# Patient Record
Sex: Female | Born: 1980
Health system: Southern US, Community
[De-identification: ages and names within clinical notes are randomized; demographics above are authoritative.]

## PROBLEM LIST (undated history)

## (undated) DIAGNOSIS — F329 Major depressive disorder, single episode, unspecified: Secondary | ICD-10-CM

## (undated) DIAGNOSIS — K219 Gastro-esophageal reflux disease without esophagitis: Secondary | ICD-10-CM

## (undated) DIAGNOSIS — N979 Female infertility, unspecified: Secondary | ICD-10-CM

## (undated) DIAGNOSIS — I10 Essential (primary) hypertension: Secondary | ICD-10-CM

## (undated) DIAGNOSIS — M549 Dorsalgia, unspecified: Secondary | ICD-10-CM

## (undated) DIAGNOSIS — K589 Irritable bowel syndrome without diarrhea: Secondary | ICD-10-CM

## (undated) DIAGNOSIS — J45909 Unspecified asthma, uncomplicated: Secondary | ICD-10-CM

## (undated) DIAGNOSIS — F419 Anxiety disorder, unspecified: Secondary | ICD-10-CM

## (undated) DIAGNOSIS — G43909 Migraine, unspecified, not intractable, without status migrainosus: Secondary | ICD-10-CM

## (undated) DIAGNOSIS — G500 Trigeminal neuralgia: Secondary | ICD-10-CM

## (undated) DIAGNOSIS — T7840XA Allergy, unspecified, initial encounter: Secondary | ICD-10-CM

## (undated) DIAGNOSIS — R5383 Other fatigue: Secondary | ICD-10-CM

## (undated) DIAGNOSIS — F32A Depression, unspecified: Secondary | ICD-10-CM

## (undated) DIAGNOSIS — G4730 Sleep apnea, unspecified: Secondary | ICD-10-CM

## (undated) DIAGNOSIS — E785 Hyperlipidemia, unspecified: Secondary | ICD-10-CM

## (undated) DIAGNOSIS — M5126 Other intervertebral disc displacement, lumbar region: Secondary | ICD-10-CM

## (undated) DIAGNOSIS — R0602 Shortness of breath: Secondary | ICD-10-CM

## (undated) HISTORY — DX: Allergy, unspecified, initial encounter: T78.40XA

## (undated) HISTORY — DX: Anxiety disorder, unspecified: F41.9

## (undated) HISTORY — DX: Dorsalgia, unspecified: M54.9

## (undated) HISTORY — DX: Female infertility, unspecified: N97.9

## (undated) HISTORY — DX: Irritable bowel syndrome, unspecified: K58.9

## (undated) HISTORY — DX: Other fatigue: R53.83

## (undated) HISTORY — DX: Sleep apnea, unspecified: G47.30

## (undated) HISTORY — DX: Migraine, unspecified, not intractable, without status migrainosus: G43.909

## (undated) HISTORY — DX: Unspecified asthma, uncomplicated: J45.909

## (undated) HISTORY — DX: Trigeminal neuralgia: G50.0

## (undated) HISTORY — DX: Depression, unspecified: F32.A

## (undated) HISTORY — DX: Other intervertebral disc displacement, lumbar region: M51.26

## (undated) HISTORY — PX: WISDOM TOOTH EXTRACTION: SHX21

## (undated) HISTORY — DX: Hyperlipidemia, unspecified: E78.5

## (undated) HISTORY — DX: Shortness of breath: R06.02

## (undated) HISTORY — DX: Essential (primary) hypertension: I10

## (undated) HISTORY — DX: Major depressive disorder, single episode, unspecified: F32.9

## (undated) HISTORY — DX: Gastro-esophageal reflux disease without esophagitis: K21.9

---
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
## 1998-05-24 ENCOUNTER — Emergency Department (HOSPITAL_COMMUNITY): Admission: EM | Admit: 1998-05-24 | Discharge: 1998-05-24 | Payer: Self-pay

## 2000-07-28 ENCOUNTER — Emergency Department (HOSPITAL_COMMUNITY): Admission: EM | Admit: 2000-07-28 | Discharge: 2000-07-28 | Payer: Self-pay | Admitting: Emergency Medicine

## 2000-08-03 ENCOUNTER — Ambulatory Visit (HOSPITAL_COMMUNITY): Admission: RE | Admit: 2000-08-03 | Discharge: 2000-08-03 | Payer: Self-pay | Admitting: *Deleted

## 2001-07-22 ENCOUNTER — Other Ambulatory Visit: Admission: RE | Admit: 2001-07-22 | Discharge: 2001-07-22 | Payer: Self-pay | Admitting: Obstetrics and Gynecology

## 2002-04-13 ENCOUNTER — Emergency Department (HOSPITAL_COMMUNITY): Admission: EM | Admit: 2002-04-13 | Discharge: 2002-04-14 | Payer: Self-pay | Admitting: Emergency Medicine

## 2002-04-14 ENCOUNTER — Encounter: Payer: Self-pay | Admitting: Obstetrics and Gynecology

## 2002-04-14 ENCOUNTER — Encounter: Admission: RE | Admit: 2002-04-14 | Discharge: 2002-04-14 | Payer: Self-pay | Admitting: Obstetrics and Gynecology

## 2002-07-25 ENCOUNTER — Other Ambulatory Visit: Admission: RE | Admit: 2002-07-25 | Discharge: 2002-07-25 | Payer: Self-pay | Admitting: Obstetrics and Gynecology

## 2002-08-10 ENCOUNTER — Encounter: Payer: Self-pay | Admitting: Emergency Medicine

## 2002-08-10 ENCOUNTER — Emergency Department (HOSPITAL_COMMUNITY): Admission: EM | Admit: 2002-08-10 | Discharge: 2002-08-10 | Payer: Self-pay | Admitting: Emergency Medicine

## 2003-03-31 ENCOUNTER — Emergency Department (HOSPITAL_COMMUNITY): Admission: EM | Admit: 2003-03-31 | Discharge: 2003-03-31 | Payer: Self-pay | Admitting: Emergency Medicine

## 2003-08-23 ENCOUNTER — Other Ambulatory Visit: Admission: RE | Admit: 2003-08-23 | Discharge: 2003-08-23 | Payer: Self-pay | Admitting: Obstetrics and Gynecology

## 2004-10-09 ENCOUNTER — Other Ambulatory Visit: Admission: RE | Admit: 2004-10-09 | Discharge: 2004-10-09 | Payer: Self-pay | Admitting: Obstetrics and Gynecology

## 2005-03-19 ENCOUNTER — Emergency Department: Payer: Self-pay | Admitting: Emergency Medicine

## 2006-02-10 ENCOUNTER — Emergency Department (HOSPITAL_COMMUNITY): Admission: EM | Admit: 2006-02-10 | Discharge: 2006-02-10 | Payer: Self-pay | Admitting: Emergency Medicine

## 2006-10-13 ENCOUNTER — Other Ambulatory Visit: Admission: RE | Admit: 2006-10-13 | Discharge: 2006-10-13 | Payer: Self-pay | Admitting: Obstetrics and Gynecology

## 2006-10-13 ENCOUNTER — Encounter: Admission: RE | Admit: 2006-10-13 | Discharge: 2006-10-13 | Payer: Self-pay | Admitting: Obstetrics and Gynecology

## 2007-08-28 ENCOUNTER — Emergency Department (HOSPITAL_COMMUNITY): Admission: EM | Admit: 2007-08-28 | Discharge: 2007-08-28 | Payer: Self-pay | Admitting: Emergency Medicine

## 2007-08-29 ENCOUNTER — Ambulatory Visit (HOSPITAL_COMMUNITY): Admission: RE | Admit: 2007-08-29 | Discharge: 2007-08-29 | Payer: Self-pay | Admitting: Obstetrics and Gynecology

## 2007-10-17 ENCOUNTER — Other Ambulatory Visit: Admission: RE | Admit: 2007-10-17 | Discharge: 2007-10-17 | Payer: Self-pay | Admitting: Obstetrics and Gynecology

## 2007-10-24 ENCOUNTER — Other Ambulatory Visit: Admission: RE | Admit: 2007-10-24 | Discharge: 2007-10-24 | Payer: Self-pay | Admitting: Obstetrics and Gynecology

## 2008-09-10 ENCOUNTER — Emergency Department: Payer: Self-pay | Admitting: Emergency Medicine

## 2010-06-13 NOTE — Procedures (Signed)
[place]  Patient:    Monica Benjamin, Monica Benjamin                    MRN: [id number] Adm. Date:  [id number] Attending:  Ammie Dalton CC:         Ammie Dalton, M.D.   Procedure Report  CLINICAL NOTE:  Monica Benjamin is a 30 year old female who was referred by Monica Benjamin for an epidural blood patch.  She has had a one-week history of a headache, which has been eased off by recumbency after undergoing a spinal tap for syndrome including flu-like illness one week ago.  She apparently had two spinal taps due to her inability to lie still with the first one, and the headache that she had with the viral syndrome changed somewhat and has become a bifrontal headache, which comes on at about 11 oclock in the morning and is relieved by lying flat.  No other neurologic symptoms associated, and her CSF was negative from the lumbar puncture.  The patient has not taken any medications other than Vioxx, which she took in the middle of the day today, and she does not bruise easily.  Review of systems was negative, she does not have any allergies, and her vital signs were stable.  On examination, slight young female in acute distress.  IMPRESSION:  She has a post-lumbar puncture headache.  PLAN:  Epidural blood patch.  I explained the risks and benefits to the patient and her mother, which include an additional spinal tap with continued headache, no relief from the headache, repeat of the patch if the headache recurs, and back pain which is usually short-lived and lasts about 24 hours from the needle stick.  They agreed to the procedure, and we transported her t to the preoperative holding area in the operating room at [facility] to perform the procedure.  PROCEDURE:  Epidural blood patch.  DESCRIPTION OF PROCEDURE:  The patient was placed in the left lateral decubitus position.  Her back and her left antecubital fossa were prepped sterilely with  Betadine.  Xylocaine 1% was infiltrated at the L4-5 interspace in the midline.  A #17 Tuohy needle was advanced to the epidural space using a loss of resistance technique at the L4-5 interspace in the midline without problem, and I then drew 15 cc of blood sterilely from her left antecubital fossa and injected it incrementally in through the Tuohy needle into her back. Toward the end of the injection, she did complain of some low back discomfort with no hamstring discomfort or neck discomfort.  The needle was removed, and the patient was turned supine.  The patient complained of low back pain below the site of the injection immediately after the procedure.  There was no hamstring tenderness and no weakness or numbness of the lower extremities. She could walk, and I discussed with her and her mother the probability of the pain being either due to the blood patch procedure or the stretching of the epidural space with injection of blood.  I prescribed for her Toradol 10 mg p.o., the first one I gave right away.  DISPOSITION:  She was discharged about one hour after the procedure home with her mother.  She was going to be with her roommate in her apartment overnight, and patient walked to the car unassisted.  I gave her a telephone number to call if she had any problems and a prescription of Toradol 10 mg p.o. t.i.d. for five doses.  She will follow up with me p.r.n., otherwise Monica Benjamin. She will call me if she has any problems. DD:  08/03/00 TD:  08/04/00 Job: [id number]

## 2010-10-24 LAB — URINALYSIS, ROUTINE W REFLEX MICROSCOPIC
Bilirubin Urine: NEGATIVE
Hgb urine dipstick: NEGATIVE
Ketones, ur: NEGATIVE
Nitrite: NEGATIVE
Urobilinogen, UA: 0.2

## 2010-10-24 LAB — RPR: RPR Ser Ql: NONREACTIVE

## 2010-10-24 LAB — GC/CHLAMYDIA PROBE AMP, GENITAL
Chlamydia, DNA Probe: NEGATIVE
GC Probe Amp, Genital: NEGATIVE

## 2010-10-24 LAB — PREGNANCY, URINE: Preg Test, Ur: NEGATIVE

## 2011-03-22 ENCOUNTER — Ambulatory Visit (INDEPENDENT_AMBULATORY_CARE_PROVIDER_SITE_OTHER): Payer: BC Managed Care – PPO | Admitting: Physician Assistant

## 2011-03-22 VITALS — BP 135/84 | HR 92 | Temp 98.9°F | Resp 16 | Ht 64.0 in | Wt 188.2 lb

## 2011-03-22 DIAGNOSIS — R05 Cough: Secondary | ICD-10-CM

## 2011-03-22 DIAGNOSIS — K219 Gastro-esophageal reflux disease without esophagitis: Secondary | ICD-10-CM

## 2011-03-22 DIAGNOSIS — F329 Major depressive disorder, single episode, unspecified: Secondary | ICD-10-CM

## 2011-03-22 DIAGNOSIS — R062 Wheezing: Secondary | ICD-10-CM

## 2011-03-22 MED ORDER — ALBUTEROL SULFATE (2.5 MG/3ML) 0.083% IN NEBU
2.5000 mg | INHALATION_SOLUTION | Freq: Once | RESPIRATORY_TRACT | Status: AC
  Administered 2011-03-22: 2.5 mg via RESPIRATORY_TRACT

## 2011-03-22 MED ORDER — ALBUTEROL SULFATE HFA 108 (90 BASE) MCG/ACT IN AERS: 2.0000 | INHALATION_SPRAY | RESPIRATORY_TRACT | Status: DC | PRN

## 2011-03-22 MED ORDER — IPRATROPIUM BROMIDE 0.02 % IN SOLN
0.5000 mg | Freq: Once | RESPIRATORY_TRACT | Status: AC
  Administered 2011-03-22: 0.5 mg via RESPIRATORY_TRACT

## 2011-03-22 MED ORDER — RANITIDINE HCL 150 MG PO TABS: 150.0000 mg | ORAL_TABLET | Freq: Two times a day (BID) | ORAL | Status: DC

## 2011-03-22 NOTE — Patient Instructions (Signed)
Go ahead and start the OTC Prilosec.  When your symptoms are improved, you may reduce the Ranitidine (Zantac) to only at bedtime, then stop it entirely, but continue the Prilosec.  USe the inhaler as needed for cough.  If you are needing the inhaler more than 3 times per week in 2-3 weeks, return for re-evaluation and medication adjustment.

## 2011-03-22 NOTE — Progress Notes (Signed)
  Subjective:    Patient ID: Monica Benjamin, female    DOB: [birth year], 31 y.o.   MRN: [id number]  HPI This patient presents with one month of cough, wheezing and indigestion, esophageal burning. She notes a weight increase over the last year since starting Cymbalta and believes that the weight gain has caused reflux which is in turn causing her respiratory symptoms. She does not feel sick-no nasal congestion, postnasal drainage, ear fullness or popping, fever or chills. No myalgias.  Her symptoms are worse at night. Cough is nonproductive. Symptoms are worse with exercise, large meals, foods that are high in fat, acid and spice.   Review of Systems As above.    Objective:   Physical Exam  Vital signs are noted. This is a well-developed, well-nourished white female who is awake, alert and oriented in no acute distress. Normocephalic and atraumatic. Pupils are equal round and reactive to light and extraocular movements are intact. Sclera and conjunctiva are clear. The external auditory canals and TMs are clear bilaterally. Nasal mucosa is moist and not congested. Oropharynx is clear. Neck is supple and nontender without lymphadenopathy or thyromegaly. Heart has a regular rate and rhythm. Lungs with diffuse high pitched musical wheezes and coughs throughout the exam. Abdomen with normoactive bowel sounds, supple with mild generalized tenderness, a little worse in the epigastrium. No masses or organomegaly. Skin is warm and dry. Peripheral pulses are symmetrically strong.  After neb treatment of albuterol and Atrovent the patient reports reduced symptoms. Wheezing is resolved.    Assessment & Plan:  Cough, wheezing, gastroesophageal reflux.  Ranitidine 150 mg twice a day. She has already purchased OTC Prilosec and will initiate that as well. Albuterol inhaler.  As her symptoms improve she may reduce the ranitidine to QD. And discontinue completely. If she is still needing the albuterol  inhaler more than 3 times per week and 2-3 weeks she should return for reevaluation and to modification in medication.

## 2011-05-25 ENCOUNTER — Ambulatory Visit (INDEPENDENT_AMBULATORY_CARE_PROVIDER_SITE_OTHER): Payer: BC Managed Care – PPO | Admitting: Physician Assistant

## 2011-05-25 VITALS — BP 139/92 | HR 70 | Temp 98.3°F | Resp 16 | Ht 64.0 in | Wt 190.0 lb

## 2011-05-25 DIAGNOSIS — R05 Cough: Secondary | ICD-10-CM

## 2011-05-25 DIAGNOSIS — R062 Wheezing: Secondary | ICD-10-CM

## 2011-05-25 DIAGNOSIS — K219 Gastro-esophageal reflux disease without esophagitis: Secondary | ICD-10-CM

## 2011-05-25 MED ORDER — HYDROCOD POLST-CHLORPHEN POLST 10-8 MG/5ML PO LQCR: 5.0000 mL | Freq: Two times a day (BID) | ORAL | Status: DC | PRN

## 2011-05-25 MED ORDER — BECLOMETHASONE DIPROPIONATE 80 MCG/ACT IN AERS: 2.0000 | INHALATION_SPRAY | Freq: Two times a day (BID) | RESPIRATORY_TRACT | Status: DC

## 2011-05-25 NOTE — Patient Instructions (Signed)
Continue the treatment for reflux with the ranitidine (Zantac).  Use the rescue inhaler and the cough syrup as needed.

## 2011-05-25 NOTE — Progress Notes (Signed)
  Subjective:    Patient ID: Monica Benjamin, female    DOB: August 11, 1980, 31 y.o.   MRN: [id number]  HPI  GI symptoms of burning and indigestion are resolved with Ranitidine.  She took OTC Prilosec x 14 days.  Cough and wheezing persist.  Wakes her from sleep. Albuterol relieves symptoms for approximately 30 minutes. No fever, chills.  No GU symptoms.  No N/V/D.  Review of Systems As above.    Objective:   Physical Exam  Vital signs noted. Well-developed, well nourished WF who is awake, alert and oriented, in NAD. HEENT: [place]/AT,sclera and conjunctiva are clear.   Neck: supple, non-tender, no lymphadenopathy, thyromegaly. Heart: RRR, no murmur Lungs: high-pitched musical wheezes on exhalation. Extremities: no cyanosis, clubbing or edema. Skin: warm and dry without rash.  Peak flow 360. Spirometry is normal.     Assessment & Plan:   1. Cough  beclomethasone (QVAR) 80 MCG/ACT inhaler, chlorpheniramine-HYDROcodone (TUSSIONEX PENNKINETIC ER) 10-8 MG/5ML LQCR  2. Wheezing  beclomethasone (QVAR) 80 MCG/ACT inhaler  3. Esophageal reflux     Patient Instructions  Continue the treatment for reflux with the ranitidine (Zantac).  Use the rescue inhaler and the cough syrup as needed.    Re-evaluate in 2 weeks.

## 2011-06-11 ENCOUNTER — Ambulatory Visit: Payer: BC Managed Care – PPO | Admitting: Physician Assistant

## 2011-06-16 ENCOUNTER — Encounter: Payer: Self-pay | Admitting: Physician Assistant

## 2011-06-16 ENCOUNTER — Ambulatory Visit (INDEPENDENT_AMBULATORY_CARE_PROVIDER_SITE_OTHER): Payer: BC Managed Care – PPO | Admitting: Physician Assistant

## 2011-06-16 VITALS — BP 133/92 | HR 62 | Temp 98.3°F | Resp 16 | Ht 64.0 in | Wt 188.8 lb

## 2011-06-16 DIAGNOSIS — R05 Cough: Secondary | ICD-10-CM

## 2011-06-16 DIAGNOSIS — K2100 Gastro-esophageal reflux disease with esophagitis, without bleeding: Secondary | ICD-10-CM

## 2011-06-16 DIAGNOSIS — J309 Allergic rhinitis, unspecified: Secondary | ICD-10-CM

## 2011-06-16 DIAGNOSIS — R059 Cough, unspecified: Secondary | ICD-10-CM

## 2011-06-16 DIAGNOSIS — R062 Wheezing: Secondary | ICD-10-CM

## 2011-06-16 MED ORDER — IPRATROPIUM BROMIDE 0.02 % IN SOLN: 0.5000 mg | Freq: Once | RESPIRATORY_TRACT | Status: DC

## 2011-06-16 MED ORDER — ALBUTEROL SULFATE (2.5 MG/3ML) 0.083% IN NEBU: 2.5000 mg | INHALATION_SOLUTION | Freq: Once | RESPIRATORY_TRACT | Status: DC

## 2011-06-16 MED ORDER — FLUTICASONE PROPIONATE 50 MCG/ACT NA SUSP: 2.0000 | Freq: Every day | NASAL | Status: DC

## 2011-06-16 MED ORDER — PREDNISONE 20 MG PO TABS: ORAL_TABLET | ORAL | Status: AC

## 2011-06-16 MED ORDER — RANITIDINE HCL 150 MG PO TABS: 150.0000 mg | ORAL_TABLET | Freq: Two times a day (BID) | ORAL | Status: DC

## 2011-06-16 NOTE — Patient Instructions (Signed)
Continue Zantac daily for relief of acid reflux Add OTC Zyrtec daily as well as prednisone taper

## 2011-06-16 NOTE — Progress Notes (Signed)
  Subjective:    Patient ID: Monica Benjamin, female    DOB: September 17, 1980, 31 y.o.   MRN: [id number]  HPI Patient present for recheck of wheezing, cough, and reflux esophagitis. States the reflux has significantly improved with the ranitidine and she no longer is experiencing those symptoms. Admits that the addition of the Qvar inhaler helped tremendously with her wheezing for about 1 week, then she developed a sore throat which exacerbated her wheezing again. She says she believes the sore throat is a result of her allergies, but she has not been on any medication to treat this. Her sore throat has improved but she continues to wheeze and have cough.    Review of Systems  All other systems reviewed and are negative.       Objective:   Physical Exam  Constitutional: She is oriented to person, place, and time. She appears well-developed and well-nourished.  HENT:  Head: Normocephalic and atraumatic.  Right Ear: External ear normal.  Left Ear: External ear normal.  Mouth/Throat: No oropharyngeal exudate.  Eyes: Conjunctivae are normal.  Neck: Neck supple.  Cardiovascular: Normal rate, regular rhythm and normal heart sounds.   Pulmonary/Chest: Effort normal. She has wheezes (diffuse inspiratory ).  Lymphadenopathy:    She has no cervical adenopathy.  Neurological: She is alert and oriented to person, place, and time.  Psychiatric: She has a normal mood and affect. Her behavior is normal. Judgment and thought content normal.          Assessment & Plan:   1. Wheezing  Continue Qvar inhaler daily and albuterol inhaler as needed Will add prednisone taper to help with bronchospasm as well as allergies albuterol (PROVENTIL) (2.5 MG/3ML) 0.083% nebulizer solution 2.5 mg, ipratropium (ATROVENT) nebulizer solution 0.5 mg  2. Allergic rhinitis  Add daily OTC Zyrtec predniSONE (DELTASONE) 20 MG tablet  3. Cough  Tussionex as needed.    4. Reflux esophagitis  Continue at current dose  until breathing resolves, then recommend back down to 1 tablet daily ranitidine (ZANTAC) 150 MG tablet

## 2011-06-16 NOTE — Progress Notes (Signed)
Patient seen and Precepted with Ms. Marte, PA-C and agree.  

## 2011-07-09 ENCOUNTER — Ambulatory Visit: Payer: BC Managed Care – PPO | Admitting: Physician Assistant

## 2012-03-09 LAB — HM PAP SMEAR: HM PAP: NEGATIVE

## 2012-06-02 ENCOUNTER — Other Ambulatory Visit: Payer: Self-pay | Admitting: Physician Assistant

## 2013-02-06 ENCOUNTER — Telehealth: Payer: Self-pay | Admitting: Nurse Practitioner

## 2013-02-06 MED ORDER — NORETHINDRONE 0.35 MG PO TABS: 1.0000 | ORAL_TABLET | Freq: Every day | ORAL | Status: DC

## 2013-02-06 NOTE — Telephone Encounter (Signed)
Last filled: AEX 03/11/12 #90/3 Refills Current AEX scheduled for 03/13/13   Camilla # 3 packs/3 reiflls (patient gets 90 day supply usually) sent; patient aware.

## 2013-02-06 NOTE — Telephone Encounter (Signed)
Pt is requesting a refill for Camila called into [facility]  [phone]

## 2013-03-13 ENCOUNTER — Ambulatory Visit: Payer: BC Managed Care – PPO | Admitting: Nurse Practitioner

## 2013-04-17 ENCOUNTER — Encounter: Payer: Self-pay | Admitting: Nurse Practitioner

## 2013-04-17 ENCOUNTER — Ambulatory Visit (INDEPENDENT_AMBULATORY_CARE_PROVIDER_SITE_OTHER): Payer: BC Managed Care – PPO | Admitting: Nurse Practitioner

## 2013-04-17 VITALS — BP 120/74 | HR 60 | Ht 64.0 in | Wt 221.0 lb

## 2013-04-17 DIAGNOSIS — Z01419 Encounter for gynecological examination (general) (routine) without abnormal findings: Secondary | ICD-10-CM

## 2013-04-17 MED ORDER — NORETHINDRONE 0.35 MG PO TABS: 1.0000 | ORAL_TABLET | Freq: Every day | ORAL | Status: DC

## 2013-04-17 NOTE — Patient Instructions (Signed)

## 2013-04-17 NOTE — Progress Notes (Signed)
Patient ID: Monica Benjamin, female   DOB: Nov 16, 1980, 33 y.o.   MRN: [id number] 33 y.o. G0P0 Single Caucasian Fe here for annual exam. Menses for 3-4 days on POP. Still some PMS at 1 week prior. Getting married in September. She is now off Cymbalta and changed to Lexapro and has lost about 15 pounds.  Patient's last menstrual period was 04/06/2013.          Sexually active: yes, same sex partner The current method of family planning is none and OCP (estrogen/progesterone).    Exercising: yes  Gym/ health club routine includes cardio and light weights 4 times per week. Smoker:  no  Health Maintenance: Pap:  03/09/12, WNL, neg HR HPV TDaP:  09/2006 Gardasil:  completed in 2007 Labs:  PCP   reports that she has never smoked. She has never used smokeless tobacco. She reports that she drinks alcohol. She reports that she does not use illicit drugs.  Past Medical History  Diagnosis Date  . Anxiety   . Depression   . Trigeminal neuralgia   . Migraines     Past Surgical History  Procedure Laterality Date  . Wisdom tooth extraction      Current Outpatient Prescriptions  Medication Sig Dispense Refill  . albuterol (PROVENTIL HFA;VENTOLIN HFA) 108 (90 BASE) MCG/ACT inhaler Inhale 2 puffs into the lungs every 4 (four) hours as needed for wheezing (cough, shortness of breath or wheezing.).  1 Inhaler  1  . escitalopram (LEXAPRO) 10 MG tablet Take 10 mg by mouth daily.      . norethindrone (CAMILA) 0.35 MG tablet Take 1 tablet (0.35 mg total) by mouth daily.  3 Package  3  . pantoprazole (PROTONIX) 40 MG tablet Take 40 mg by mouth 2 (two) times daily.      Marland Kitchen QVAR 80 MCG/ACT inhaler INHALE 2 PUFFS BY MOUTH 2 TIMES A DAY.  8.7 g  0   No current facility-administered medications for this visit.    Family History  Problem Relation Age of Onset  . Osteoporosis Mother   . Migraines Mother   . Migraines Sister     ROS:  Pertinent items are noted in HPI.  Otherwise, a comprehensive ROS was  negative.  Exam:   BP 120/74  Pulse 60  Ht 5\' 4"  (1.626 m)  Wt 221 lb (100.245 kg)  BMI 37.92 kg/m2  LMP 04/06/2013 Height: 5\' 4"  (162.6 cm)  Ht Readings from Last 3 Encounters:  04/17/13 5\' 4"  (1.626 m)  06/16/11 5\' 4"  (1.626 m)  05/25/11 5\' 4"  (1.626 m)    General appearance: alert, cooperative and appears stated age Head: Normocephalic, without obvious abnormality, atraumatic Neck: no adenopathy, supple, symmetrical, trachea midline and thyroid normal to inspection and palpation Lungs: clear to auscultation bilaterally Breasts: normal appearance, no masses or tenderness Heart: regular rate and rhythm Abdomen: soft, non-tender; no masses,  no organomegaly Extremities: extremities normal, atraumatic, no cyanosis or edema Skin: Skin color, texture, turgor normal. No rashes or lesions Lymph nodes: Cervical, supraclavicular, and axillary nodes normal. No abnormal inguinal nodes palpated Neurologic: Grossly normal   Pelvic: External genitalia:  no lesions              Urethra:  normal appearing urethra with no masses, tenderness or lesions              Bartholin's and Skene's: normal                 Vagina: normal appearing vagina  with normal color and discharge, no lesions              Cervix: anteverted              Pap taken: no Bimanual Exam:  Uterus:  normal size, contour, position, consistency, mobility, non-tender              Adnexa: no mass, fullness, tenderness               Rectovaginal: Confirms               Anus:  normal sphincter tone, no lesions  A:  Well Woman with normal exam  Same sex partner - getting married in September  On POP for cycle regulation  History of depression and anxiety  P:   Pap smear as per guidelines Not done  Refill Camilla for a year  Counseled on breast self exam, adequate intake of calcium and vitamin D, diet and exercise return annually or prn  An After Visit Summary was printed and given to the patient.

## 2013-04-18 NOTE — Progress Notes (Signed)
Encounter reviewed by Dr. Caliyah Sieh Silva.  

## 2013-12-27 ENCOUNTER — Other Ambulatory Visit: Payer: Self-pay | Admitting: Physician Assistant

## 2013-12-27 DIAGNOSIS — K219 Gastro-esophageal reflux disease without esophagitis: Secondary | ICD-10-CM

## 2013-12-27 DIAGNOSIS — R109 Unspecified abdominal pain: Secondary | ICD-10-CM

## 2013-12-27 DIAGNOSIS — IMO0001 Reserved for inherently not codable concepts without codable children: Secondary | ICD-10-CM

## 2014-01-02 ENCOUNTER — Ambulatory Visit
Admission: RE | Admit: 2014-01-02 | Discharge: 2014-01-02 | Disposition: A | Discharge status: Home / Self Care | Payer: Self-pay | Source: Ambulatory Visit | Attending: Physician Assistant | Admitting: Physician Assistant

## 2014-01-02 ENCOUNTER — Encounter (INDEPENDENT_AMBULATORY_CARE_PROVIDER_SITE_OTHER): Payer: Self-pay

## 2014-01-02 DIAGNOSIS — IMO0001 Reserved for inherently not codable concepts without codable children: Secondary | ICD-10-CM

## 2014-01-02 DIAGNOSIS — K219 Gastro-esophageal reflux disease without esophagitis: Secondary | ICD-10-CM

## 2014-01-02 DIAGNOSIS — R109 Unspecified abdominal pain: Secondary | ICD-10-CM

## 2014-03-30 ENCOUNTER — Other Ambulatory Visit: Payer: Self-pay | Admitting: *Deleted

## 2014-03-30 MED ORDER — NORETHINDRONE 0.35 MG PO TABS: 1.0000 | ORAL_TABLET | Freq: Every day | ORAL | Status: DC

## 2014-03-30 NOTE — Telephone Encounter (Signed)
Medication refill request: Monica Benjamin  Last AEX:  04/17/13 with PG  Next AEX: 04/23/14 with PG  Last MMG (if hormonal medication request): N/A Refill authorized: #3 packs/0 rfs, please advise.

## 2014-03-30 NOTE — Telephone Encounter (Signed)
Fax faxed to [facility] stating that #3 pks/0 rfs was sent electronically.

## 2014-04-23 ENCOUNTER — Ambulatory Visit: Payer: BC Managed Care – PPO | Admitting: Nurse Practitioner

## 2014-05-18 ENCOUNTER — Encounter: Payer: Self-pay | Admitting: Nurse Practitioner

## 2014-05-18 ENCOUNTER — Ambulatory Visit (INDEPENDENT_AMBULATORY_CARE_PROVIDER_SITE_OTHER): Payer: 59 | Admitting: Nurse Practitioner

## 2014-05-18 VITALS — BP 108/66 | HR 68 | Ht 64.0 in | Wt 222.0 lb

## 2014-05-18 DIAGNOSIS — Z0000 Encounter for general adult medical examination without abnormal findings: Secondary | ICD-10-CM

## 2014-05-18 DIAGNOSIS — R197 Diarrhea, unspecified: Secondary | ICD-10-CM | POA: Diagnosis not present

## 2014-05-18 DIAGNOSIS — Z01419 Encounter for gynecological examination (general) (routine) without abnormal findings: Secondary | ICD-10-CM

## 2014-05-18 DIAGNOSIS — R635 Abnormal weight gain: Secondary | ICD-10-CM

## 2014-05-18 LAB — POCT URINALYSIS DIPSTICK
BILIRUBIN UA: NEGATIVE
Glucose, UA: NEGATIVE
KETONES UA: NEGATIVE
Nitrite, UA: NEGATIVE
PH UA: 5
Urobilinogen, UA: NEGATIVE

## 2014-05-18 MED ORDER — NORETHINDRONE 0.35 MG PO TABS: 1.0000 | ORAL_TABLET | Freq: Every day | ORAL | Status: DC

## 2014-05-18 NOTE — Progress Notes (Signed)
Patient ID: Monica Benjamin, female   DOB: 1980/09/30, 34 y.o.   MRN: [id number] 34 y.o. G0P0 Married Caucasian Fe here for annual exam.  Menses now at 2-3 days moderate to light on POP.  She got married in September.  They are going to seek infertility counseling and try for a pregnancy this year.  Partner will be the one getting pregnant.  She is having a problem with frequent daily diarrhea 5-6 times a day for over 6 months.  No  relationship to foods.  No N/V but does have frequent lower abdominal pressure and increase in flatus.  GERD is very bad unless taking Protonix twice a day.  She has noted upper epigastric symptoms on and off.  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  Has tried to loose weight and unable to do so.  She feels bloated all the time in addition to fatigue and generally not well.      Patient's last menstrual period was 05/14/2014 (exact date).          Sexually active: Yes.    The current method of family planning is OCP (Progesterone only) and same sex partner.    Exercising: Yes.    cardio, weights and walking Smoker:  no  Health Maintenance: Pap:  03/09/12, negative with neg HR HPV TDaP:  09/2006 Labs:  HB:  13.3  Urine:  Trace RBC, trace leuk's (spotting with menses)   reports that she has never smoked. She has never used smokeless tobacco. She reports that she drinks alcohol. She reports that she does not use illicit drugs.  Past Medical History  Diagnosis Date  . Anxiety   . Depression   . Trigeminal neuralgia   . Migraines     Past Surgical History  Procedure Laterality Date  . Wisdom tooth extraction      Current Outpatient Prescriptions  Medication Sig Dispense Refill  . albuterol (PROVENTIL HFA;VENTOLIN HFA) 108 (90 BASE) MCG/ACT inhaler Inhale 2 puffs into the lungs every 4 (four) hours as needed for wheezing (cough, shortness of breath or wheezing.). 1 Inhaler 1  . escitalopram  (LEXAPRO) 10 MG tablet Take 10 mg by mouth daily.    . norethindrone (CAMILA) 0.35 MG tablet Take 1 tablet (0.35 mg total) by mouth daily. 3 Package 3  . pantoprazole (PROTONIX) 40 MG tablet Take 40 mg by mouth 2 (two) times daily.    Marland Kitchen QVAR 80 MCG/ACT inhaler INHALE 2 PUFFS BY MOUTH 2 TIMES A DAY. 8.7 g 0   No current facility-administered medications for this visit.    Family History  Problem Relation Age of Onset  . Osteoporosis Mother   . Migraines Mother   . Migraines Sister     ROS:  Pertinent items are noted in HPI.  Otherwise, a comprehensive ROS was negative.  Exam:   BP 108/66 mmHg  Pulse 68  Ht 5\' 4"  (1.626 m)  Wt 222 lb (100.699 kg)  BMI 38.09 kg/m2  LMP 05/14/2014 (Exact Date) Height: 5\' 4"  (162.6 cm) Ht Readings from Last 3 Encounters:  05/18/14 5\' 4"  (1.626 m)  04/17/13 5\' 4"  (1.626 m)  06/16/11 5\' 4"  (1.626 m)    General appearance: alert, cooperative and appears stated age Head: Normocephalic, without obvious abnormality, atraumatic Neck: no adenopathy, supple, symmetrical, trachea midline and thyroid normal to inspection and palpation Lungs: clear to auscultation bilaterally Breasts: normal appearance, no masses  or tenderness Heart: regular rate and rhythm Abdomen: soft, non-tender; no masses,  no organomegaly Extremities: extremities normal, atraumatic, no cyanosis or edema Skin: Skin color, texture, turgor normal. No rashes or lesions Lymph nodes: Cervical, supraclavicular, and axillary nodes normal. No abnormal inguinal nodes palpated Neurologic: Grossly normal   Pelvic: External genitalia:  no lesions              Urethra:  normal appearing urethra with no masses, tenderness or lesions              Bartholin's and Skene's: normal                 Vagina: normal appearing vagina with normal color and discharge, no lesions              Cervix: anteverted              Pap taken: No. Bimanual Exam:  Uterus:  normal size, contour, position,  consistency, mobility, non-tender              Adnexa: no mass, fullness, tenderness               Rectovaginal: Confirms               Anus:  normal sphincter tone, no lesions  Chaperone present:  no  A:  Well Woman with normal exam  Same sex partner - got married in September On POP for cycle regulation History of depression and anxiety  Obesity   Chronic diarrhea X 6 months - needs GI consult  Fatigue and weight gain  P:   Reviewed health and wellness pertinent to exam  Pap smear not taken today  Will follow with basic labs - TSH and panel, Vit D, H.Pylori, CMP, CBC  Will get a referral to Dr. Collene Mares for further eval  Refill on POP for a year  After the GI evaluation I feel that she would do well with Endocrinology apt. If still does not feel - I do not want her to have wight this high at her age.  Counseled on breast self exam, use and side effects of POP's, adequate intake of calcium and vitamin D, diet and exercise return annually or prn  An After Visit Summary was printed and given to the patient.

## 2014-05-18 NOTE — Patient Instructions (Signed)

## 2014-05-19 LAB — CBC WITH DIFFERENTIAL/PLATELET
BASOS ABS: 0 10*3/uL (ref 0.0–0.1)
Basophils Relative: 0 % (ref 0–1)
EOS PCT: 4 % (ref 0–5)
Eosinophils Absolute: 0.4 10*3/uL (ref 0.0–0.7)
HEMATOCRIT: 40.4 % (ref 36.0–46.0)
HEMOGLOBIN: 13.7 g/dL (ref 12.0–15.0)
LYMPHS PCT: 29 % (ref 12–46)
Lymphs Abs: 3 10*3/uL (ref 0.7–4.0)
MCH: 30.9 pg (ref 26.0–34.0)
MCHC: 33.9 g/dL (ref 30.0–36.0)
MCV: 91.2 fL (ref 78.0–100.0)
MONO ABS: 0.6 10*3/uL (ref 0.1–1.0)
MPV: 10.2 fL (ref 8.6–12.4)
Monocytes Relative: 6 % (ref 3–12)
Neutro Abs: 6.4 10*3/uL (ref 1.7–7.7)
Neutrophils Relative %: 61 % (ref 43–77)
Platelets: 318 10*3/uL (ref 150–400)
RBC: 4.43 MIL/uL (ref 3.87–5.11)
RDW: 14 % (ref 11.5–15.5)
WBC: 10.5 10*3/uL (ref 4.0–10.5)

## 2014-05-19 LAB — COMPREHENSIVE METABOLIC PANEL
ALT: 15 U/L (ref 0–35)
AST: 15 U/L (ref 0–37)
Albumin: 3.9 g/dL (ref 3.5–5.2)
Alkaline Phosphatase: 80 U/L (ref 39–117)
BUN: 13 mg/dL (ref 6–23)
CALCIUM: 9.3 mg/dL (ref 8.4–10.5)
CHLORIDE: 105 meq/L (ref 96–112)
CO2: 25 meq/L (ref 19–32)
CREATININE: 0.84 mg/dL (ref 0.50–1.10)
GLUCOSE: 96 mg/dL (ref 70–99)
Potassium: 4.3 mEq/L (ref 3.5–5.3)
Sodium: 140 mEq/L (ref 135–145)
Total Bilirubin: 0.3 mg/dL (ref 0.2–1.2)
Total Protein: 6.8 g/dL (ref 6.0–8.3)

## 2014-05-19 LAB — THYROID PANEL WITH TSH
FREE THYROXINE INDEX: 2.2 (ref 1.4–3.8)
T3 UPTAKE: 27 % (ref 22–35)
T4 TOTAL: 8.2 ug/dL (ref 4.5–12.0)
TSH: 1.346 u[IU]/mL (ref 0.350–4.500)

## 2014-05-19 LAB — VITAMIN D 25 HYDROXY (VIT D DEFICIENCY, FRACTURES): VIT D 25 HYDROXY: 27 ng/mL — AB (ref 30–100)

## 2014-05-20 NOTE — Progress Notes (Signed)
Encounter reviewed by Dr. Juergen Hardenbrook Silva.  

## 2014-05-21 LAB — HEMOGLOBIN, FINGERSTICK: Hemoglobin, fingerstick: 13.3 g/dL (ref 12.0–16.0)

## 2014-05-23 ENCOUNTER — Telehealth: Payer: Self-pay | Admitting: Nurse Practitioner

## 2014-05-23 NOTE — Telephone Encounter (Signed)
Call to patient. Advised of appointment with Dr Collene Mares on May 31, 2014 @ 1130.  Patient agreeable

## 2014-07-31 ENCOUNTER — Emergency Department (INDEPENDENT_AMBULATORY_CARE_PROVIDER_SITE_OTHER)
Admission: EM | Admit: 2014-07-31 | Discharge: 2014-07-31 | Disposition: A | Discharge status: Home / Self Care | Payer: PRIVATE HEALTH INSURANCE | Source: Home / Self Care | Attending: Emergency Medicine | Admitting: Emergency Medicine

## 2014-07-31 ENCOUNTER — Emergency Department (INDEPENDENT_AMBULATORY_CARE_PROVIDER_SITE_OTHER): Payer: PRIVATE HEALTH INSURANCE

## 2014-07-31 ENCOUNTER — Encounter (HOSPITAL_COMMUNITY): Payer: Self-pay | Admitting: *Deleted

## 2014-07-31 DIAGNOSIS — S9031XA Contusion of right foot, initial encounter: Secondary | ICD-10-CM | POA: Diagnosis not present

## 2014-07-31 NOTE — ED Notes (Signed)
Pt   Reports      She  Injured  Her    r  Foot /  Ankle  Today     After  Stepping   On a  Pallet         Pt  Reports      Pain on  Weight  Bearing

## 2014-07-31 NOTE — ED Provider Notes (Signed)
CSN: [id number]     Arrival date & time 07/31/14  1658 History   First MD Initiated Contact with Patient 07/31/14 1840     Chief Complaint  Patient presents with  . Foot Injury   (Consider location/radiation/quality/duration/timing/severity/associated sxs/prior Treatment) HPI Comments: 34 year old female was at work this afternoon and stepped on a wooden pallet that broke in her foot went through the palate. She is complaining of pain to the proximal foot along the dorsal lateral aspect. She said she cannot bear weight on the foot without having too much pain.   Past Medical History  Diagnosis Date  . Anxiety   . Depression   . Trigeminal neuralgia   . Migraines    Past Surgical History  Procedure Laterality Date  . Wisdom tooth extraction     Family History  Problem Relation Age of Onset  . Osteoporosis Mother   . Migraines Mother   . Migraines Sister    History  Substance Use Topics  . Smoking status: Never Smoker   . Smokeless tobacco: Never Used  . Alcohol Use: Yes     Comment: occas   OB History    Gravida Para Term Preterm AB TAB SAB Ectopic Multiple Living   0 0             Review of Systems  Constitutional: Negative for fever, chills and activity change.  HENT: Negative.   Respiratory: Negative.   Musculoskeletal: Negative for back pain, joint swelling and neck pain.       As per HPI  Skin: Negative for color change and pallor.  Neurological: Negative.     Allergies  Nsaids  Home Medications   Prior to Admission medications   Medication Sig Start Date End Date Taking? Authorizing Provider  albuterol (PROVENTIL HFA;VENTOLIN HFA) 108 (90 BASE) MCG/ACT inhaler Inhale 2 puffs into the lungs every 4 (four) hours as needed for wheezing (cough, shortness of breath or wheezing.). 03/22/11   Chelle Jeffery, PA-C  escitalopram (LEXAPRO) 10 MG tablet Take 10 mg by mouth daily.    Historical Provider, MD  norethindrone (CAMILA) 0.35 MG tablet Take 1 tablet (0.35  mg total) by mouth daily. 05/18/14   Kem Boroughs, FNP  pantoprazole (PROTONIX) 40 MG tablet Take 40 mg by mouth 2 (two) times daily.    Historical Provider, MD  QVAR 80 MCG/ACT inhaler INHALE 2 PUFFS BY MOUTH 2 TIMES A DAY. 06/02/12   Mancel Bale, PA-C   BP 124/68 mmHg  Pulse 72  Temp(Src) 98.5 F (36.9 C) (Oral)  Resp 16  SpO2 100%  LMP 07/11/2014 Physical Exam  Constitutional: She is oriented to person, place, and time. She appears well-developed and well-nourished. No distress.  Eyes: EOM are normal.  Neck: Normal range of motion. Neck supple.  Cardiovascular: Normal rate.   Pulmonary/Chest: Effort normal. No respiratory distress.  Abdominal: Soft. There is no tenderness.  Musculoskeletal: Normal range of motion.  Right foot with no deformity. There is a small area of swelling just distal to the lateral malleolus and over the fourth and fifth metatarsal. Distal neurovascular motor sensory is intact. Dorsiflexion intact but limited due to pain. Plantar flexion is intact. No bony tenderness to the ankle.  Neurological: She is alert and oriented to person, place, and time. No cranial nerve deficit.  Skin: Skin is warm and dry.  Psychiatric: She has a normal mood and affect.  Nursing note and vitals reviewed.   ED Course  Procedures (including critical care time)  Labs Review Labs Reviewed - No data to display  Imaging Review Dg Foot Complete Right  07/31/2014   CLINICAL DATA:  Patient stepped on wooden Pallet and broken the board. Injury to the right foot. The foot is numb. Unable to bear weight. Initial encounter.  EXAM: RIGHT FOOT COMPLETE - 3+ VIEW  COMPARISON:  None.  FINDINGS: Normal anatomic alignment. No evidence for acute fracture or dislocation. Bipartite medial sesamoid. Regional soft tissues unremarkable.  IMPRESSION: No acute osseous abnormality.   Electronically Signed   By: Lovey Newcomer M.D.   On: 07/31/2014 19:04     MDM   1. Foot contusion, right, initial  encounter    Ace or coban wrap/crutches per pt request OOW for 3 d RICE F/u with your PCP as needed   Janne Napoleon, NP 07/31/14 1935

## 2014-07-31 NOTE — Discharge Instructions (Signed)

## 2014-11-14 ENCOUNTER — Telehealth: Payer: 59 | Admitting: Family

## 2014-11-14 DIAGNOSIS — R6889 Other general symptoms and signs: Secondary | ICD-10-CM | POA: Diagnosis not present

## 2014-11-14 DIAGNOSIS — J309 Allergic rhinitis, unspecified: Secondary | ICD-10-CM

## 2014-11-14 DIAGNOSIS — J069 Acute upper respiratory infection, unspecified: Secondary | ICD-10-CM

## 2014-11-14 MED ORDER — BENZONATATE 200 MG PO CAPS: 200.0000 mg | ORAL_CAPSULE | Freq: Three times a day (TID) | ORAL | Status: DC | PRN

## 2014-11-14 MED ORDER — METHYLPREDNISOLONE 4 MG PO TBPK: ORAL_TABLET | ORAL | Status: DC

## 2014-11-14 NOTE — Progress Notes (Signed)
E visit for Flu like symptoms   We are sorry that you are not feeling well.  Here is how we plan to help! Based on what you have shared with me it looks like you may have flu-like symptoms that should be watched but do not seem to indicate anti-viral treatment because it has been over two days.   Influenza or "the flu" is   an infection caused by a respiratory virus. The flu virus is highly contagious and persons who did not receive their yearly flu vaccination may "catch" the flu from close contact.  We have anti-viral medications to treat the viruses that cause this infection. They are not a "cure" and only shorten the course of the infection. These prescriptions are most effective when they are given within the first 2 days of "flu" symptoms. Antiviral medication are indicated if you have a high risk of complications from the flu. You should  also consider an antiviral medication if you are in close contact with someone who is at risk. These medications can help patients avoid complications from the flu  but have side effects that you should know. Possible side effects from Tamiflu or oseltamivir include nausea, vomiting, diarrhea, dizziness, headaches, eye redness, sleep problems or other respiratory symptoms. You should not take Tamiflu if you have an allergy to oseltamivir or any to the ingredients in Tamiflu.  Based upon your symptoms and potential risk factors I recommend that you follow the flu symptoms recommendation that I have listed below.  ANYONE WHO HAS FLU SYMPTOMS SHOULD: . Stay home. The flu is highly contagious and going out or to work exposes others! . Be sure to drink plenty of fluids. Water is fine as well as fruit juices, sodas and electrolyte beverages. You may want to stay away from caffeine or alcohol. If you are nauseated, try taking small sips of liquids. How do you know if you are getting enough fluid? Your urine should be a pale yellow or almost colorless. . Get  rest. . Taking a steamy shower or using a humidifier may help nasal congestion and ease sore throat pain. Using a saline nasal spray works much the same way. . Cough drops, hard candies and sore throat lozenges may ease your cough. . Line up a caregiver. Have someone check on you regularly.   GET HELP RIGHT AWAY IF: . You cannot keep down liquids or your medications. . You become short of breath . Your fell like you are going to pass out or loose consciousness. . Your symptoms persist after you have completed your treatment plan MAKE SURE YOU   Understand these instructions.  Will watch your condition.  Will get help right away if you are not doing well or get worse.  Your e-visit answers were reviewed by a board certified advanced clinical practitioner to complete your personal care plan.  Depending on the condition, your plan could have included both over the counter or prescription medications.  If there is a problem please reply  once you have received a response from your provider.  Your safety is important to [place].  If you have drug allergies check your prescription carefully.    You can use MyChart to ask questions about today's visit, request a non-urgent call back, or ask for a work or school excuse for 24 hours related to this e-Visit. If it has been greater than 24 hours you will need to follow up with your provider, or enter a new e-Visit to address  those concerns.  You will get an e-mail in the next two days asking about your experience.  I hope that your e-visit has been valuable and will speed your recovery. Thank you for using e-visits.   

## 2014-11-14 NOTE — Progress Notes (Signed)
We are sorry that you are not feeling well.  Here is how we plan to help!  Based on what you have shared with me it looks like you have upper respiratory tract inflammation that has resulted in a significant cough.  Inflammation and infection in the upper respiratory tract is commonly called bronchitis and has four common causes:  Allergies, Viral Infections, Acid Reflux and Bacterial Infections.  Allergies, viruses and acid reflux are treated by controlling symptoms or eliminating the cause. An example might be a cough caused by taking certain blood pressure medications. You stop the cough by changing the medication. Another example might be a cough caused by acid reflux. Controlling the reflux helps control the cough.  Based on your presentation I believe you most likely have A cough due to allergies.  I recommend that you start the an over-the counter-allergy medication such as Claritin 10 mg or Zyrtec 10 mg daily.    In addition you may use A non-prescription cough medication called Robitussin DAC. Take 2 teaspoons every 8 hours or Delsym: take 2 teaspoons every 12 hours., A non-prescription cough medication called Mucinex DM: take 2 tablets every 12 hours. and A prescription cough medication called Tessalon Perles 100mg . You may take 1-2 capsules every 8 hours as needed for your cough. I also sent in a steroid dose pack. Use as directed.     HOME CARE . Only take medications as instructed by your medical team. . Complete the entire course of an antibiotic. . Drink plenty of fluids and get plenty of rest. . Avoid close contacts especially the very young and the elderly . Cover your mouth if you cough or cough into your sleeve. . Always remember to wash your hands . A steam or ultrasonic humidifier can help congestion.    GET HELP RIGHT AWAY IF: . You develop worsening fever. . You become short of breath . You cough up blood. . Your symptoms persist after you have completed your treatment  plan MAKE SURE YOU   Understand these instructions.  Will watch your condition.  Will get help right away if you are not doing well or get worse.  Your e-visit answers were reviewed by a board certified advanced clinical practitioner to complete your personal care plan.  Depending on the condition, your plan could have included both over the counter or prescription medications. If there is a problem please reply  once you have received a response from your provider. Your safety is important to [place].  If you have drug allergies check your prescription carefully.    You can use MyChart to ask questions about today's visit, request a non-urgent call back, or ask for a work or school excuse for 24 hours related to this e-Visit. If it has been greater than 24 hours you will need to follow up with your provider, or enter a new e-Visit to address those concerns. You will get an e-mail in the next two days asking about your experience.  I hope that your e-visit has been valuable and will speed your recovery. Thank you for using e-visits.

## 2015-01-31 MED FILL — ESCITALOPRAM 10 MG TABLET: 10 | 30 days supply | Qty: 30 | Fill #6

## 2015-01-31 MED FILL — PANTOPRAZOLE SOD DR 40 MG T: 40 | 90 days supply | Qty: 180 | Fill #3

## 2015-03-04 ENCOUNTER — Other Ambulatory Visit: Payer: Self-pay | Admitting: Nurse Practitioner

## 2015-03-04 MED ORDER — NORETHINDRONE 0.35 MG PO TABS: 1.0000 | ORAL_TABLET | Freq: Every day | ORAL | Status: DC

## 2015-03-04 MED FILL — ESCITALOPRAM 10 MG TABLET: 10 | 30 days supply | Qty: 30 | Fill #7

## 2015-03-04 MED FILL — NORETHINDRONE 0.35 MG TAB: 0.35 | 84 days supply | Qty: 84 | Fill #0

## 2015-03-04 NOTE — Telephone Encounter (Signed)
Patient requesting refill of Camila to [facility] patient will run out in 4 days. Best # to reach patient: [place] -  Phone: [phone] Fax: [phone]

## 2015-03-04 NOTE — Telephone Encounter (Signed)
Medication refill request: Monica Benjamin   Last AEX:  05-18-14  Next AEX: 05-24-15 Last MMG (if hormonal medication request): N/A Refill authorized: please advise

## 2015-04-01 MED FILL — ESCITALOPRAM 10 MG TABLET: 10 | 30 days supply | Qty: 30 | Fill #8

## 2015-04-29 MED FILL — ESCITALOPRAM 10 MG TABLET: 10 | 30 days supply | Qty: 30 | Fill #9

## 2015-05-01 MED FILL — PANTOPRAZOLE SOD DR 40 MG T: 40 | 90 days supply | Qty: 180 | Fill #0

## 2015-05-24 ENCOUNTER — Ambulatory Visit (INDEPENDENT_AMBULATORY_CARE_PROVIDER_SITE_OTHER): Payer: 59 | Admitting: Nurse Practitioner

## 2015-05-24 ENCOUNTER — Encounter: Payer: Self-pay | Admitting: Nurse Practitioner

## 2015-05-24 VITALS — BP 116/74 | HR 60 | Ht 63.5 in | Wt 217.0 lb

## 2015-05-24 DIAGNOSIS — Z01419 Encounter for gynecological examination (general) (routine) without abnormal findings: Secondary | ICD-10-CM

## 2015-05-24 DIAGNOSIS — Z0000 Encounter for general adult medical examination without abnormal findings: Secondary | ICD-10-CM | POA: Diagnosis not present

## 2015-05-24 LAB — POCT URINALYSIS DIPSTICK
BILIRUBIN UA: NEGATIVE
Glucose, UA: NEGATIVE
KETONES UA: NEGATIVE
Leukocytes, UA: NEGATIVE
Nitrite, UA: NEGATIVE
PH UA: 5
PROTEIN UA: NEGATIVE
RBC UA: NEGATIVE
Urobilinogen, UA: NEGATIVE

## 2015-05-24 MED ORDER — NORETHINDRONE 0.35 MG PO TABS: 1.0000 | ORAL_TABLET | Freq: Every day | ORAL | Status: DC

## 2015-05-24 MED FILL — NORETHINDRONE 0.35 MG TAB: 0.35 | 84 days supply | Qty: 84 | Fill #0

## 2015-05-24 NOTE — Patient Instructions (Signed)

## 2015-05-24 NOTE — Progress Notes (Signed)
Patient ID: Monica Benjamin, female   DOB: 1980/06/02, 35 y.o.   MRN: [id number]  35 y.o. G0P0 Maried Caucasian Fe here for annual exam.  Partner has now tried for pregnancy X 4 with IUI.  They are hopeful to have a pregnancy this year.  Menses is at 4-5 days on Micronor.  Sometimes 1 day heavier and has cramps.  Last year was having a lot of GI problems.  She saw Dr. Collene Mares all work up and labs were negative including H -Pylori.  Suggested to have a colonoscopy if symptoms got worse - they did not.  Working diagnosis is IBSD.  Patient's last menstrual period was 04/27/2015 (exact date).          Sexually active: Yes.    The current method of family planning is OCP (estrogen/progesterone) and same sex partner.    Exercising: Yes.    walking, weights Smoker:  no  Health Maintenance: Pap:  03/09/12, Negative with neg HR HPV TDaP:  09/2006 HIV: done today Labs:  HB: 13.3  Urine: Negative    reports that she has never smoked. She has never used smokeless tobacco. She reports that she drinks alcohol. She reports that she does not use illicit drugs.  Past Medical History  Diagnosis Date  . Anxiety   . Depression   . Trigeminal neuralgia   . Migraines     Past Surgical History  Procedure Laterality Date  . Wisdom tooth extraction      Current Outpatient Prescriptions  Medication Sig Dispense Refill  . albuterol (PROVENTIL HFA;VENTOLIN HFA) 108 (90 BASE) MCG/ACT inhaler Inhale 2 puffs into the lungs every 4 (four) hours as needed for wheezing (cough, shortness of breath or wheezing.). 1 Inhaler 1  . escitalopram (LEXAPRO) 10 MG tablet Take 10 mg by mouth daily.    Marland Kitchen loratadine (CLARITIN) 10 MG tablet Take 1 tablet by mouth daily.    . norethindrone (CAMILA) 0.35 MG tablet Take 1 tablet (0.35 mg total) by mouth daily. 3 Package 4  . pantoprazole (PROTONIX) 40 MG tablet Take 40 mg by mouth 2 (two) times daily.     No current facility-administered medications for this visit.    Family  History  Problem Relation Age of Onset  . Osteoporosis Mother   . Migraines Mother   . Migraines Sister     ROS:  Pertinent items are noted in HPI.  Otherwise, a comprehensive ROS was negative.  Exam:   BP 116/74 mmHg  Pulse 60  Ht 5' 3.5" (1.613 m)  Wt 217 lb (98.431 kg)  BMI 37.83 kg/m2  LMP 04/27/2015 (Exact Date) Height: 5' 3.5" (161.3 cm) Ht Readings from Last 3 Encounters:  05/24/15 5' 3.5" (1.613 m)  05/18/14 5\' 4"  (1.626 m)  04/17/13 5\' 4"  (1.626 m)    General appearance: alert, cooperative and appears stated age Head: Normocephalic, without obvious abnormality, atraumatic Neck: no adenopathy, supple, symmetrical, trachea midline and thyroid normal to inspection and palpation Lungs: clear to auscultation bilaterally Breasts: normal appearance, no masses or tenderness Heart: regular rate and rhythm Abdomen: soft, non-tender; no masses,  no organomegaly Extremities: extremities normal, atraumatic, no cyanosis or edema Skin: Skin color, texture, turgor normal. No rashes or lesions Lymph nodes: Cervical, supraclavicular, and axillary nodes normal. No abnormal inguinal nodes palpated Neurologic: Grossly normal   Pelvic: External genitalia:  no lesions              Urethra:  normal appearing urethra with no masses, tenderness or  lesions              Bartholin's and Skene's: normal                 Vagina: normal appearing vagina with normal color and discharge, no lesions              Cervix: anteverted              Pap taken: Yes.   Bimanual Exam:  Uterus:  normal size, contour, position, consistency, mobility, non-tender              Adnexa: no mass, fullness, tenderness               Rectovaginal: Confirms               Anus:  normal sphincter tone, no lesions  Chaperone present: yes  A:  Well Woman with normal exam  Same sex partner - got married in September 2015 On POP for cycle regulation History of depression and  anxiety Obesity  Fatigue and weight gain   P:   Reviewed health and wellness pertinent to exam  Pap smear as above  Refill on Micronor for a year  Counseled on breast self exam, use and side effects of POP's, adequate intake of calcium and vitamin D, diet and exercise return annually or prn  An After Visit Summary was printed and given to the patient.

## 2015-05-25 LAB — HIV ANTIBODY (ROUTINE TESTING W REFLEX): HIV: NONREACTIVE

## 2015-05-25 NOTE — Progress Notes (Signed)
Encounter reviewed by Dr. Lashawnna Lambrecht Amundson C. Silva.  

## 2015-05-27 DIAGNOSIS — Z1151 Encounter for screening for human papillomavirus (HPV): Secondary | ICD-10-CM | POA: Diagnosis not present

## 2015-05-27 DIAGNOSIS — Z0000 Encounter for general adult medical examination without abnormal findings: Secondary | ICD-10-CM | POA: Diagnosis not present

## 2015-05-27 LAB — HEMOGLOBIN, FINGERSTICK: HEMOGLOBIN, FINGERSTICK: 13.3 g/dL (ref 12.0–16.0)

## 2015-05-29 LAB — IPS PAP TEST WITH HPV

## 2015-05-29 MED FILL — ESCITALOPRAM 10 MG TABLET: 10 | 30 days supply | Qty: 30 | Fill #10

## 2015-06-28 MED FILL — ESCITALOPRAM 10 MG TABLET: 10 | 30 days supply | Qty: 30 | Fill #11

## 2015-07-24 MED FILL — ESCITALOPRAM 10 MG TABLET: 10 | 30 days supply | Qty: 30 | Fill #0

## 2015-07-26 MED FILL — PANTOPRAZOLE SOD DR 40 MG T: 40 | 90 days supply | Qty: 180 | Fill #1

## 2015-08-26 MED FILL — ESCITALOPRAM 10 MG TABLET: 10 | 30 days supply | Qty: 30 | Fill #1

## 2015-08-26 MED FILL — NORETHINDRONE 0.35 MG TAB: 0.35 | 84 days supply | Qty: 84 | Fill #1

## 2015-09-27 MED FILL — ESCITALOPRAM 10 MG TABLET: 10 | 30 days supply | Qty: 30 | Fill #2

## 2015-10-15 ENCOUNTER — Ambulatory Visit (INDEPENDENT_AMBULATORY_CARE_PROVIDER_SITE_OTHER): Payer: 59 | Admitting: Physician Assistant

## 2015-10-15 VITALS — BP 122/84 | HR 78 | Temp 98.2°F | Resp 17 | Ht 63.5 in | Wt 225.0 lb

## 2015-10-15 DIAGNOSIS — R062 Wheezing: Secondary | ICD-10-CM

## 2015-10-15 DIAGNOSIS — K219 Gastro-esophageal reflux disease without esophagitis: Secondary | ICD-10-CM | POA: Diagnosis not present

## 2015-10-15 MED ORDER — IPRATROPIUM BROMIDE 0.02 % IN SOLN
0.5000 mg | Freq: Once | RESPIRATORY_TRACT | Status: AC
  Administered 2015-10-15: 0.5 mg via RESPIRATORY_TRACT

## 2015-10-15 MED ORDER — ALBUTEROL SULFATE (2.5 MG/3ML) 0.083% IN NEBU
2.5000 mg | INHALATION_SOLUTION | Freq: Once | RESPIRATORY_TRACT | Status: AC
  Administered 2015-10-15: 2.5 mg via RESPIRATORY_TRACT

## 2015-10-15 MED ORDER — PREDNISONE 10 MG PO TABS: ORAL_TABLET | ORAL | 0 refills | Status: DC

## 2015-10-15 MED ORDER — BECLOMETHASONE DIPROPIONATE 80 MCG/ACT IN AERS: 2.0000 | INHALATION_SPRAY | Freq: Two times a day (BID) | RESPIRATORY_TRACT | 1 refills | Status: DC

## 2015-10-15 MED ORDER — ALBUTEROL SULFATE HFA 108 (90 BASE) MCG/ACT IN AERS: 2.0000 | INHALATION_SPRAY | RESPIRATORY_TRACT | 1 refills | Status: DC | PRN

## 2015-10-15 NOTE — Progress Notes (Signed)
Monica Benjamin  MRN: [id number] DOB: July 13, 1980  Subjective:  Venesa P Halilovic is a 35 y.o. female seen in office today for a chief complaint of nonproductive cough and wheezing x 3 weeks. Has associated SOB with exercise or if she gets into a coughing spell. Pt has had to use albuterol inhaler 3-4 x daily for the past 3 weeks. She gets about 10 minutes of relief. Prior to this she was using once every six months.   Pt saw an asthma/allergy specialist 2 years ago and was diagnosed exercise induced asthma. At this time she was also having a terrible cough. It was discovered during this time that she had laryngeal spasm associated with GERD. They initially put her on PPI once a day and that helped for a while but then she was increased to twice a day. Has been taking protonix twice a day for one year and doing fine until one month ago.    Pt has mild intermittent acid reflux over the past year, nothing compared to what it was 2 years ago and has consistently taken her medications. She has no GERD symptoms present at this time, just the cough and wheezing.   Review of Systems  Constitutional: Negative for chills, diaphoresis, fatigue and fever.  HENT: Negative for congestion, sinus pressure and sneezing.   Eyes: Negative for itching.  Respiratory: Positive for chest tightness.   Cardiovascular: Negative for chest pain, palpitations and leg swelling.  Gastrointestinal: Negative for abdominal pain, diarrhea, nausea and vomiting.  Allergic/Immunologic: Negative for environmental allergies.  Neurological: Negative for dizziness and light-headedness.    Patient Active Problem List   Diagnosis Date Noted  . Depression 03/22/2011    Current Outpatient Prescriptions on File Prior to Visit  Medication Sig Dispense Refill  . albuterol (PROVENTIL HFA;VENTOLIN HFA) 108 (90 BASE) MCG/ACT inhaler Inhale 2 puffs into the lungs every 4 (four) hours as needed for wheezing (cough, shortness of  breath or wheezing.). 1 Inhaler 1  . escitalopram (LEXAPRO) 10 MG tablet Take 10 mg by mouth daily.    Marland Kitchen loratadine (CLARITIN) 10 MG tablet Take 1 tablet by mouth daily.    . norethindrone (CAMILA) 0.35 MG tablet Take 1 tablet (0.35 mg total) by mouth daily. 3 Package 4  . pantoprazole (PROTONIX) 40 MG tablet Take 40 mg by mouth 2 (two) times daily.     No current facility-administered medications on file prior to visit.     Allergies  Allergen Reactions  . Nsaids Swelling    Lips and face   Social History   Social History  . Marital status: Married    Spouse name: N/A  . Number of children: N/A  . Years of education: N/A   Occupational History  . Not on file.   Social History Main Topics  . Smoking status: Never Smoker  . Smokeless tobacco: Never Used  . Alcohol use Yes     Comment: occas  . Drug use: No  . Sexual activity: Not on file   Other Topics Concern  . Not on file   Social History Narrative  . No narrative on file     Objective:  BP 122/84 (BP Location: Right Arm, Patient Position: Sitting, Cuff Size: Large)   Pulse (!) 109   Temp 98.2 F (36.8 C) (Oral)   Resp 17   Ht 5' 3.5" (1.613 m)   Wt 225 lb (102.1 kg)   LMP 09/24/2015   SpO2 99%   BMI 39.23 kg/m  Physical Exam  Constitutional: She is oriented to person, place, and time and well-developed, well-nourished, and in no distress.  HENT:  Head: Normocephalic and atraumatic.  Right Ear: Tympanic membrane, external ear and ear canal normal.  Left Ear: Tympanic membrane, external ear and ear canal normal.  Nose: Nose normal.  Mouth/Throat: Uvula is midline, oropharynx is clear and moist and mucous membranes are normal.  Eyes: Conjunctivae are normal.  Neck: Normal range of motion.  Cardiovascular: Regular rhythm and normal heart sounds.  Tachycardia present.   Pulmonary/Chest: Effort normal. She has wheezes (expiratory, dfifuse bilateral posterior lung fields ).  Neurological: She is alert  and oriented to person, place, and time. Gait normal.  Skin: Skin is warm and dry.  Psychiatric: Affect normal.  Vitals reviewed.  Pre peak: 300, Post peak: 350 1st albuterol nebulizer: Pt does not report much relief. Posterior expiratory wheezes still auscultated. No changes in lung exam.  2nd albuterol and atrovent nebulizer: Pt notes she feels better after second treatment. Posterior expiratory wheezes still auscultated on lung exam.   Assessment and Plan :  1. Wheezing - albuterol (PROVENTIL) (2.5 MG/3ML) 0.083% nebulizer solution 2.5 mg; Take 3 mLs (2.5 mg total) by nebulization once. - Spirometry: Peak - albuterol (PROVENTIL) (2.5 MG/3ML) 0.083% nebulizer solution 2.5 mg; Take 3 mLs (2.5 mg total) by nebulization once. - ipratropium (ATROVENT) nebulizer solution 0.5 mg; Take 2.5 mLs (0.5 mg total) by nebulization once. - predniSONE (DELTASONE) 10 MG tablet; 6-5-4-3-2-1 taper. Take all pills for that day in the am with food.  Dispense: 21 tablet; Refill: 0 - albuterol (PROVENTIL HFA;VENTOLIN HFA) 108 (90 Base) MCG/ACT inhaler; Inhale 2 puffs into the lungs every 4 (four) hours as needed for wheezing (cough, shortness of breath or wheezing.).  Dispense: 1 Inhaler; Refill: 1 - beclomethasone (QVAR) 80 MCG/ACT inhaler; Inhale 2 puffs into the lungs 2 (two) times daily.  Dispense: 1 Inhaler; Refill: 1 -Continue protonix -Use albtuerol inhaler -Return in 48 hours if no improvement, may need CXR -If symptoms worsen over night, go to ER   2. GERD, esophagitis presence not specified -Continue Prontonix twice daily   Tenna Delaine PA-C  [facility] 10/15/2015 6:37 PM

## 2015-10-15 NOTE — Patient Instructions (Addendum)
Take prednisone as prescribed. Make sure you take with food. If you develop muscle aches you can stretch to try and relief discomfort.   Use QVAR twice daily. You can use albuterol inhaler max two times a day.( May use it in the shower to get the same effect as a nebulizer.)   Continue protonix twice daily.  If no improvement in 48 hours, return to clinic     IF you received an x-ray today, you will receive an invoice from [facility]. Please contact [facility] at [phone] with questions or concerns regarding your invoice.   IF you received labwork today, you will receive an invoice from [employer]. Please contact [facility] at [phone] with questions or concerns regarding your invoice.   Our billing staff will not be able to assist you with questions regarding bills from these companies.  You will be contacted with the lab results as soon as they are available. The fastest way to get your results is to activate your My Chart account. Instructions are located on the last page of this paperwork. If you have not heard from [place] regarding the results in 2 weeks, please contact this office.

## 2015-10-16 MED FILL — PANTOPRAZOLE SOD DR 40 MG T: 40 | 90 days supply | Qty: 180 | Fill #2

## 2015-10-16 MED FILL — VENTOLIN HFA 90 MCG INHALER: 108 (90 BAS | 17 days supply | Qty: 18 | Fill #0

## 2015-10-16 MED FILL — predniSONE 10 MG TABS: 10 | 6 days supply | Qty: 21 | Fill #0

## 2015-10-16 MED FILL — QVAR 80 MCG ORAL INHALER: 80 | 30 days supply | Qty: 9 | Fill #0

## 2015-10-28 MED FILL — ESCITALOPRAM 10 MG TABLET: 10 | 30 days supply | Qty: 30 | Fill #3

## 2015-11-11 MED FILL — HEATHER TABLET: 0.35 | 84 days supply | Qty: 84 | Fill #2

## 2015-11-25 DIAGNOSIS — F411 Generalized anxiety disorder: Secondary | ICD-10-CM | POA: Diagnosis not present

## 2015-11-25 DIAGNOSIS — J4540 Moderate persistent asthma, uncomplicated: Secondary | ICD-10-CM | POA: Insufficient documentation

## 2015-11-25 DIAGNOSIS — R400 Somnolence: Secondary | ICD-10-CM | POA: Diagnosis not present

## 2015-11-25 DIAGNOSIS — R0683 Snoring: Secondary | ICD-10-CM | POA: Diagnosis not present

## 2015-11-25 DIAGNOSIS — K219 Gastro-esophageal reflux disease without esophagitis: Secondary | ICD-10-CM | POA: Diagnosis not present

## 2015-11-25 DIAGNOSIS — J4520 Mild intermittent asthma, uncomplicated: Secondary | ICD-10-CM | POA: Diagnosis not present

## 2015-11-25 DIAGNOSIS — N926 Irregular menstruation, unspecified: Secondary | ICD-10-CM | POA: Insufficient documentation

## 2015-11-25 DIAGNOSIS — J301 Allergic rhinitis due to pollen: Secondary | ICD-10-CM | POA: Insufficient documentation

## 2015-11-25 DIAGNOSIS — K582 Mixed irritable bowel syndrome: Secondary | ICD-10-CM | POA: Diagnosis not present

## 2015-11-25 MED FILL — ESCITALOPRAM 10 MG TABLET: 10 | 30 days supply | Qty: 30 | Fill #4

## 2015-12-26 MED FILL — ESCITALOPRAM 10 MG TABLET: 10 | 30 days supply | Qty: 30 | Fill #5

## 2016-01-21 DIAGNOSIS — K219 Gastro-esophageal reflux disease without esophagitis: Secondary | ICD-10-CM | POA: Diagnosis not present

## 2016-01-21 DIAGNOSIS — J301 Allergic rhinitis due to pollen: Secondary | ICD-10-CM | POA: Diagnosis not present

## 2016-01-21 DIAGNOSIS — J4540 Moderate persistent asthma, uncomplicated: Secondary | ICD-10-CM | POA: Diagnosis not present

## 2016-01-21 DIAGNOSIS — R05 Cough: Secondary | ICD-10-CM | POA: Diagnosis not present

## 2016-01-21 MED FILL — PANTOPRAZOLE SOD DR 40 MG T: 40 | 90 days supply | Qty: 180 | Fill #3

## 2016-01-21 MED FILL — ESCITALOPRAM 10 MG TABLET: 10 | 30 days supply | Qty: 30 | Fill #6

## 2016-01-24 DIAGNOSIS — J4541 Moderate persistent asthma with (acute) exacerbation: Secondary | ICD-10-CM | POA: Diagnosis not present

## 2016-01-24 MED FILL — AZITHROMYCIN 250 MG TABLET: 250 | 5 days supply | Qty: 6 | Fill #0

## 2016-02-07 MED FILL — HEATHER TABLET: 0.35 | 84 days supply | Qty: 84 | Fill #3

## 2016-02-17 DIAGNOSIS — R05 Cough: Secondary | ICD-10-CM | POA: Diagnosis not present

## 2016-02-24 MED FILL — ESCITALOPRAM 10 MG TABLET: 10 | 30 days supply | Qty: 30 | Fill #7

## 2016-03-06 DIAGNOSIS — J4540 Moderate persistent asthma, uncomplicated: Secondary | ICD-10-CM | POA: Diagnosis not present

## 2016-03-06 DIAGNOSIS — J301 Allergic rhinitis due to pollen: Secondary | ICD-10-CM | POA: Diagnosis not present

## 2016-03-06 DIAGNOSIS — G4719 Other hypersomnia: Secondary | ICD-10-CM | POA: Diagnosis not present

## 2016-03-06 DIAGNOSIS — R05 Cough: Secondary | ICD-10-CM | POA: Diagnosis not present

## 2016-03-19 DIAGNOSIS — G4710 Hypersomnia, unspecified: Secondary | ICD-10-CM | POA: Diagnosis not present

## 2016-03-24 MED FILL — ESCITALOPRAM 10 MG TABLET: 10 | 30 days supply | Qty: 30 | Fill #8

## 2016-03-30 DIAGNOSIS — J4540 Moderate persistent asthma, uncomplicated: Secondary | ICD-10-CM | POA: Diagnosis not present

## 2016-03-30 DIAGNOSIS — G4733 Obstructive sleep apnea (adult) (pediatric): Secondary | ICD-10-CM | POA: Diagnosis not present

## 2016-03-30 DIAGNOSIS — G4719 Other hypersomnia: Secondary | ICD-10-CM | POA: Diagnosis not present

## 2016-03-30 DIAGNOSIS — J301 Allergic rhinitis due to pollen: Secondary | ICD-10-CM | POA: Diagnosis not present

## 2016-04-13 ENCOUNTER — Ambulatory Visit (INDEPENDENT_AMBULATORY_CARE_PROVIDER_SITE_OTHER): Payer: 59 | Admitting: Nurse Practitioner

## 2016-04-13 ENCOUNTER — Encounter: Payer: Self-pay | Admitting: Nurse Practitioner

## 2016-04-13 VITALS — BP 140/100 | HR 78 | Resp 16 | Ht 63.5 in | Wt 221.0 lb

## 2016-04-13 DIAGNOSIS — Z3169 Encounter for other general counseling and advice on procreation: Secondary | ICD-10-CM

## 2016-04-13 NOTE — Progress Notes (Signed)
36 y.o. Married Caucasian female G0P0 here for discussion about conception.  She is on POP for about 7 yrs.  Prior had been on OCP and got bad migraine HA's.  She has a history of irregular menses and for that reason POP has been continued.  Has been thought to maybe have PCOS but not confirmed.  Her wife [name](?) is with her today.  She has tried infertility treatments with Dr. Rolin Barry and has had unsuccessful IUI X 7.  Normal HSG.  Pt is now  considering carrying a pregnancy for them even though that has not been a desire of hers.  She would like to see a different infertility specialist.   O: Healthy WD,WN female Affect: normal, tense today prior to coming No exam is indicated today   A: Desires contraception by IUI  POP for cycle regulation  History of irregular menses -not diagnosed with PCO'S  Elevated BP today secondary to stress   P:  Discussed that we can refer her to Dr. Kerin Perna  She will monitor her BP at home with spouse and report findings  They are considering a pregnancy for pt.  She will CB if desires for [place] to make a referral  Labs none  Consult time with pt and wife at 15 minutes.  RV

## 2016-04-16 NOTE — Progress Notes (Signed)
Encounter reviewed by Dr. Aundria Rud. BP 140/100 at visit.

## 2016-04-20 MED FILL — ESCITALOPRAM 10 MG TABLET: 10 | 30 days supply | Qty: 30 | Fill #9

## 2016-04-20 MED FILL — PANTOPRAZOLE SOD DR 40 MG T: 40 | 30 days supply | Qty: 60 | Fill #0

## 2016-04-21 DIAGNOSIS — R062 Wheezing: Secondary | ICD-10-CM | POA: Diagnosis not present

## 2016-04-21 DIAGNOSIS — J029 Acute pharyngitis, unspecified: Secondary | ICD-10-CM | POA: Diagnosis not present

## 2016-04-21 DIAGNOSIS — J101 Influenza due to other identified influenza virus with other respiratory manifestations: Secondary | ICD-10-CM | POA: Diagnosis not present

## 2016-04-21 MED FILL — BENZONATATE 200 MG CAPSULE: 200 | 7 days supply | Qty: 20 | Fill #0

## 2016-04-21 MED FILL — ONDANSETRON ODT 8 MG TABLET: 8 | 7 days supply | Qty: 21 | Fill #0

## 2016-04-21 MED FILL — OSELTAMIVIR PHOSPHATE 75 MG: 75 | 5 days supply | Qty: 10 | Fill #0

## 2016-04-21 MED FILL — PROMETHAZINE-DM SYRUP: 6.25-15 | 14 days supply | Qty: 70 | Fill #0

## 2016-04-21 MED FILL — AZITHROMYCIN 250 MG TABLET: 250 | 5 days supply | Qty: 6 | Fill #0

## 2016-05-01 DIAGNOSIS — G4733 Obstructive sleep apnea (adult) (pediatric): Secondary | ICD-10-CM | POA: Diagnosis not present

## 2016-05-22 MED FILL — ESCITALOPRAM 10 MG TABLET: 10 | 30 days supply | Qty: 30 | Fill #10

## 2016-05-25 DIAGNOSIS — J4540 Moderate persistent asthma, uncomplicated: Secondary | ICD-10-CM | POA: Diagnosis not present

## 2016-05-25 DIAGNOSIS — Z3169 Encounter for other general counseling and advice on procreation: Secondary | ICD-10-CM | POA: Diagnosis not present

## 2016-05-25 DIAGNOSIS — Z8669 Personal history of other diseases of the nervous system and sense organs: Secondary | ICD-10-CM | POA: Insufficient documentation

## 2016-05-25 DIAGNOSIS — F411 Generalized anxiety disorder: Secondary | ICD-10-CM | POA: Diagnosis not present

## 2016-05-25 DIAGNOSIS — G4733 Obstructive sleep apnea (adult) (pediatric): Secondary | ICD-10-CM | POA: Diagnosis not present

## 2016-05-25 DIAGNOSIS — Z9989 Dependence on other enabling machines and devices: Secondary | ICD-10-CM | POA: Diagnosis not present

## 2016-05-25 MED FILL — PANTOPRAZOLE SOD DR 20 MG T: 20 | 90 days supply | Qty: 90 | Fill #0

## 2016-05-31 DIAGNOSIS — G4733 Obstructive sleep apnea (adult) (pediatric): Secondary | ICD-10-CM | POA: Diagnosis not present

## 2016-06-01 ENCOUNTER — Ambulatory Visit: Payer: 59 | Admitting: Nurse Practitioner

## 2016-06-09 DIAGNOSIS — G4733 Obstructive sleep apnea (adult) (pediatric): Secondary | ICD-10-CM | POA: Diagnosis not present

## 2016-06-10 DIAGNOSIS — G4733 Obstructive sleep apnea (adult) (pediatric): Secondary | ICD-10-CM | POA: Diagnosis not present

## 2016-06-10 DIAGNOSIS — J301 Allergic rhinitis due to pollen: Secondary | ICD-10-CM | POA: Diagnosis not present

## 2016-06-10 DIAGNOSIS — J4540 Moderate persistent asthma, uncomplicated: Secondary | ICD-10-CM | POA: Diagnosis not present

## 2016-06-10 DIAGNOSIS — Z9989 Dependence on other enabling machines and devices: Secondary | ICD-10-CM | POA: Diagnosis not present

## 2016-06-24 MED FILL — ESCITALOPRAM 10 MG TABLET: 10 | 30 days supply | Qty: 30 | Fill #11

## 2016-07-01 DIAGNOSIS — G4733 Obstructive sleep apnea (adult) (pediatric): Secondary | ICD-10-CM | POA: Diagnosis not present

## 2016-07-21 MED FILL — ESCITALOPRAM 10 MG TABLET: 10 | 90 days supply | Qty: 90 | Fill #0

## 2016-07-22 ENCOUNTER — Ambulatory Visit (INDEPENDENT_AMBULATORY_CARE_PROVIDER_SITE_OTHER): Payer: 59 | Admitting: Nurse Practitioner

## 2016-07-22 ENCOUNTER — Encounter: Payer: Self-pay | Admitting: Nurse Practitioner

## 2016-07-22 VITALS — BP 110/68 | HR 64 | Ht 64.0 in | Wt 216.0 lb

## 2016-07-22 DIAGNOSIS — Z01419 Encounter for gynecological examination (general) (routine) without abnormal findings: Secondary | ICD-10-CM | POA: Diagnosis not present

## 2016-07-22 DIAGNOSIS — Z0000 Encounter for general adult medical examination without abnormal findings: Secondary | ICD-10-CM

## 2016-07-22 DIAGNOSIS — Z3169 Encounter for other general counseling and advice on procreation: Secondary | ICD-10-CM | POA: Diagnosis not present

## 2016-07-22 NOTE — Progress Notes (Signed)
Patient ID: Monica Benjamin, female   DOB: 07-10-1980, 36 y.o.   MRN: [id number]  36 y.o. G0P0000 Married  Caucasian Fe here for annual exam.  She is now off POP and planning for a pregnancy. Menses is regular 28 day.  Flow for 3-5 days,  Moderate to light.  No PMS.   She needs a referral to Monica Benjamin.  Her wife Monica Benjamin has had 7 unsuccessful IUI with Monica Benjamin.  They do have a 64 week old little girl from [facility] whom they have had since she was 55 days old.  They are both committed to have a family.      Patient's last menstrual period was 07/10/2016 (exact date).          Sexually active: Yes.    The current method of family planning is oral progesterone-only contraceptive and same sex partner.    Exercising: Yes.    cardio and weights Smoker:  no  Health Maintenance: Pap: 05/24/15, Negative with neg HR HPV  03/09/12, Negative with neg HR HPV History of Abnormal Pap: no Self Breast exams: yes TDaP: 10/31/13 HIV: 05/24/15 Labs: None   reports that she has never smoked. She has never used smokeless tobacco. She reports that she drinks alcohol. She reports that she does not use drugs.  Past Medical History:  Diagnosis Date  . Allergy   . Anxiety   . Asthma   . Depression   . Migraines   . Trigeminal neuralgia     Past Surgical History:  Procedure Laterality Date  . WISDOM TOOTH EXTRACTION      Current Outpatient Prescriptions  Medication Sig Dispense Refill  . albuterol (PROVENTIL HFA;VENTOLIN HFA) 108 (90 Base) MCG/ACT inhaler Inhale 2 puffs into the lungs every 4 (four) hours as needed for wheezing (cough, shortness of breath or wheezing.). 1 Inhaler 1  . budesonide-formoterol (SYMBICORT) 80-4.5 MCG/ACT inhaler Inhale into the lungs.    Marland Kitchen escitalopram (LEXAPRO) 10 MG tablet Take 10 mg by mouth daily.    . fluticasone (FLONASE) 50 MCG/ACT nasal spray Place into the nose.    . loratadine (CLARITIN) 10 MG tablet Take 1 tablet by mouth daily.    . norethindrone (CAMILA)  0.35 MG tablet Take 1 tablet (0.35 mg total) by mouth daily. 3 Package 4  . pantoprazole (PROTONIX) 40 MG tablet Take 40 mg by mouth 2 (two) times daily.     No current facility-administered medications for this visit.     Family History  Problem Relation Age of Onset  . Osteoporosis Mother   . Migraines Mother   . Migraines Sister     ROS:  Pertinent items are noted in HPI.  Otherwise, a comprehensive ROS was negative.  Exam:   BP 110/68 (BP Location: Right Arm, Patient Position: Sitting, Cuff Size: Large)   Pulse 64   Ht 5\' 4"  (1.626 m)   Wt 216 lb (98 kg)   LMP 07/10/2016 (Exact Date)   BMI 37.08 kg/m  Height: 5\' 4"  (162.6 cm) Ht Readings from Last 3 Encounters:  07/22/16 5\' 4"  (1.626 m)  04/13/16 5' 3.5" (1.613 m)  10/15/15 5' 3.5" (1.613 m)    General appearance: alert, cooperative and appears stated age Head: Normocephalic, without obvious abnormality, atraumatic Neck: no adenopathy, supple, symmetrical, trachea midline and thyroid normal to inspection and palpation Lungs: clear to auscultation bilaterally Breasts: normal appearance, no masses or tenderness Heart: regular rate and rhythm Abdomen: soft, non-tender; no masses,  no organomegaly Extremities:  extremities normal, atraumatic, no cyanosis or edema Skin: Skin color, texture, turgor normal. No rashes or lesions Lymph nodes: Cervical, supraclavicular, and axillary nodes normal. No abnormal inguinal nodes palpated Neurologic: Grossly normal   Pelvic: External genitalia:  no lesions              Urethra:  normal appearing urethra with no masses, tenderness or lesions              Bartholin's and Skene's: normal                 Vagina: normal appearing vagina with normal color and discharge, no lesions              Cervix: anteverted              Pap taken: No. Bimanual Exam:  Uterus:  normal size, contour, position, consistency, mobility, non-tender              Adnexa: no mass, fullness, tenderness                Rectovaginal: Confirms               Anus:  normal sphincter tone, no lesions  Chaperone present: yes  A:  Well Woman with normal exam  Same sex partner  Off POP since March  History of depression and anxiety  History of asthma and GERD   P:   Reviewed health and wellness pertinent to exam  Pap smear: no  counseled on breast self exam, adequate intake of calcium and vitamin D, diet and exercise return annually or prn  An After Visit Summary was printed and given to the patient.

## 2016-07-22 NOTE — Patient Instructions (Signed)

## 2016-07-23 NOTE — Progress Notes (Signed)
Encounter reviewed Kenney Going, MD   

## 2016-07-31 DIAGNOSIS — G4733 Obstructive sleep apnea (adult) (pediatric): Secondary | ICD-10-CM | POA: Diagnosis not present

## 2016-08-17 MED FILL — PANTOPRAZOLE SOD DR 20 MG T: 20 | 90 days supply | Qty: 90 | Fill #1

## 2016-09-02 ENCOUNTER — Telehealth: Payer: 59 | Admitting: Nurse Practitioner

## 2016-09-02 DIAGNOSIS — R05 Cough: Secondary | ICD-10-CM | POA: Diagnosis not present

## 2016-09-02 DIAGNOSIS — R059 Cough, unspecified: Secondary | ICD-10-CM

## 2016-09-02 MED ORDER — BENZONATATE 100 MG PO CAPS: 100.0000 mg | ORAL_CAPSULE | Freq: Three times a day (TID) | ORAL | 0 refills | Status: DC | PRN

## 2016-09-02 MED ORDER — AZITHROMYCIN 250 MG PO TABS: ORAL_TABLET | ORAL | 0 refills | Status: DC

## 2016-09-02 MED FILL — BENZONATATE 100 MG CAP: 100 | 20 days supply | Qty: 20 | Fill #0

## 2016-09-02 MED FILL — AZITHROMYCIN 250 MG TABLET: 250 | 5 days supply | Qty: 6 | Fill #0

## 2016-09-02 NOTE — Progress Notes (Signed)
We are sorry that you are not feeling well.  Here is how we plan to help!  Based on your presentation I believe you most likely have A cough due to bacteria.  When patients have a fever and a productive cough with a change in color or increased sputum production, we are concerned about bacterial bronchitis.  If left untreated it can progress to pneumonia.  If your symptoms do not improve with your treatment plan it is important that you contact your provider.   I have prescribed Azithromyin 250 mg: two tables now and then one tablet daily for 4 additonal days    In addition you may use A prescription cough medication called Tessalon Perles 100mg . You may take 1-2 capsules every 8 hours as needed for your cough.    From your responses in the eVisit questionnaire you describe inflammation in the upper respiratory tract which is causing a significant cough.  This is commonly called Bronchitis and has four common causes:    Allergies  Viral Infections  Acid Reflux  Bacterial Infection Allergies, viruses and acid reflux are treated by controlling symptoms or eliminating the cause. An example might be a cough caused by taking certain blood pressure medications. You stop the cough by changing the medication. Another example might be a cough caused by acid reflux. Controlling the reflux helps control the cough.  USE OF BRONCHODILATOR ("RESCUE") INHALERS: There is a risk from using your bronchodilator too frequently.  The risk is that over-reliance on a medication which only relaxes the muscles surrounding the breathing tubes can reduce the effectiveness of medications prescribed to reduce swelling and congestion of the tubes themselves.  Although you feel brief relief from the bronchodilator inhaler, your asthma may actually be worsening with the tubes becoming more swollen and filled with mucus.  This can delay other crucial treatments, such as oral steroid medications. If you need to use a  bronchodilator inhaler daily, several times per day, you should discuss this with your provider.  There are probably better treatments that could be used to keep your asthma under control.     HOME CARE . Only take medications as instructed by your medical team. . Complete the entire course of an antibiotic. . Drink plenty of fluids and get plenty of rest. . Avoid close contacts especially the very young and the elderly . Cover your mouth if you cough or cough into your sleeve. . Always remember to wash your hands . A steam or ultrasonic humidifier can help congestion.   GET HELP RIGHT AWAY IF: . You develop worsening fever. . You become short of breath . You cough up blood. . Your symptoms persist after you have completed your treatment plan MAKE SURE YOU   Understand these instructions.  Will watch your condition.  Will get help right away if you are not doing well or get worse.  Your e-visit answers were reviewed by a board certified advanced clinical practitioner to complete your personal care plan.  Depending on the condition, your plan could have included both over the counter or prescription medications. If there is a problem please reply  once you have received a response from your provider. Your safety is important to [place].  If you have drug allergies check your prescription carefully.    You can use MyChart to ask questions about today's visit, request a non-urgent call back, or ask for a work or school excuse for 24 hours related to this e-Visit. If it has  greater than 24 hours you will need to follow up with your provider, or enter a new e-Visit to address those concerns. You will get an e-mail in the next two days asking about your experience.  I hope that your e-visit has been valuable and will speed your recovery. Thank you for using e-visits.   

## 2016-09-18 DIAGNOSIS — G4733 Obstructive sleep apnea (adult) (pediatric): Secondary | ICD-10-CM | POA: Diagnosis not present

## 2016-10-12 DIAGNOSIS — D252 Subserosal leiomyoma of uterus: Secondary | ICD-10-CM | POA: Diagnosis not present

## 2016-10-12 DIAGNOSIS — Z319 Encounter for procreative management, unspecified: Secondary | ICD-10-CM | POA: Diagnosis not present

## 2016-10-12 DIAGNOSIS — E288 Other ovarian dysfunction: Secondary | ICD-10-CM | POA: Diagnosis not present

## 2016-10-19 MED FILL — ESCITALOPRAM 10 MG TABLET: 10 | 90 days supply | Qty: 90 | Fill #1

## 2016-10-30 DIAGNOSIS — Z3141 Encounter for fertility testing: Secondary | ICD-10-CM | POA: Diagnosis not present

## 2016-10-30 DIAGNOSIS — N8500 Endometrial hyperplasia, unspecified: Secondary | ICD-10-CM | POA: Diagnosis not present

## 2016-10-30 DIAGNOSIS — Z319 Encounter for procreative management, unspecified: Secondary | ICD-10-CM | POA: Diagnosis not present

## 2016-10-30 DIAGNOSIS — Z113 Encounter for screening for infections with a predominantly sexual mode of transmission: Secondary | ICD-10-CM | POA: Diagnosis not present

## 2016-10-30 MED FILL — DOXYCYCLINE HYCLATE 100 MG: 100 | 5 days supply | Qty: 10 | Fill #0

## 2016-11-02 MED FILL — ESTRADIOL 2 MG TABLET: 2 | 30 days supply | Qty: 60 | Fill #0

## 2016-11-02 MED FILL — SM ALCOHOL 70% PREP PADS: 70 | 30 days supply | Qty: 100 | Fill #0

## 2016-11-02 MED FILL — BD NEEDLES 30GX0.5": 30G X 1/2" | 25 days supply | Qty: 25 | Fill #0

## 2016-11-02 MED FILL — BD NEEDLES 22GX1.5": 22G X 1-1/2 | 30 days supply | Qty: 30 | Fill #0

## 2016-11-02 MED FILL — BD 3 ML SYRINGE 18GX1-1/2": 18G X 1-1/2 | 30 days supply | Qty: 60 | Fill #0

## 2016-11-02 MED FILL — MENOPUR 75 UNIT VIAL: 75 | 10 days supply | Qty: 10 | Fill #0

## 2016-11-02 MED FILL — ESTRADIOL 0.1 MG PATCH: 0.1 | 28 days supply | Qty: 8 | Fill #0

## 2016-11-02 MED FILL — GONAL-F 1,050 UNITS VIAL: 1050 | 30 days supply | Qty: 3 | Fill #0

## 2016-11-02 MED FILL — BD NEEDLES 22GX1.5: 22G X 1-1/2 | 30 days supply | Qty: 30 | Fill #0

## 2016-11-02 MED FILL — DOXYCYCLINE HYCLATE 100 MG: 100 | 10 days supply | Qty: 20 | Fill #0

## 2016-11-02 MED FILL — BD NEEDLES 30GX0.5: 30G X 1/2" | 25 days supply | Qty: 25 | Fill #0

## 2016-11-02 MED FILL — PROGESTERONE OIL 50 MG/ML V: 50 | 30 days supply | Qty: 30 | Fill #0

## 2016-11-02 MED FILL — SHARPS COLLECTOR 1.4QT: 30 days supply | Qty: 1 | Fill #0

## 2016-11-02 MED FILL — METHYLPREDNISOLONE 4 MG TAB: 4 | 4 days supply | Qty: 16 | Fill #0

## 2016-11-02 MED FILL — CETROTIDE 0.25 MG KIT: 0.25 | 4 days supply | Qty: 4 | Fill #0

## 2016-11-02 MED FILL — NOVAREL 5000 UNIT SOLR: 5000 | 30 days supply | Qty: 2 | Fill #0

## 2016-11-02 MED FILL — BD 3 ML SYRINGE 18GX1-1/2: 18G X 1-1/2 | 30 days supply | Qty: 60 | Fill #0

## 2016-11-10 DIAGNOSIS — Z3183 Encounter for assisted reproductive fertility procedure cycle: Secondary | ICD-10-CM | POA: Diagnosis not present

## 2016-11-12 MED FILL — PANTOPRAZOLE SOD DR 20 MG T: 20 | 90 days supply | Qty: 90 | Fill #0

## 2016-11-17 DIAGNOSIS — Z3183 Encounter for assisted reproductive fertility procedure cycle: Secondary | ICD-10-CM | POA: Diagnosis not present

## 2016-11-20 DIAGNOSIS — Z3183 Encounter for assisted reproductive fertility procedure cycle: Secondary | ICD-10-CM | POA: Diagnosis not present

## 2016-11-21 DIAGNOSIS — Z3183 Encounter for assisted reproductive fertility procedure cycle: Secondary | ICD-10-CM | POA: Diagnosis not present

## 2016-11-23 DIAGNOSIS — Z3183 Encounter for assisted reproductive fertility procedure cycle: Secondary | ICD-10-CM | POA: Diagnosis not present

## 2016-11-25 DIAGNOSIS — Z3183 Encounter for assisted reproductive fertility procedure cycle: Secondary | ICD-10-CM | POA: Diagnosis not present

## 2016-11-27 DIAGNOSIS — Z3183 Encounter for assisted reproductive fertility procedure cycle: Secondary | ICD-10-CM | POA: Diagnosis not present

## 2016-11-28 DIAGNOSIS — Z3183 Encounter for assisted reproductive fertility procedure cycle: Secondary | ICD-10-CM | POA: Diagnosis not present

## 2016-11-30 MED FILL — OXYCOD/ACETAMINOPHEN 5-325M: 5-325 | 1 days supply | Qty: 10 | Fill #0

## 2016-11-30 MED FILL — PROMETHAZINE 12.5 MG TABLET: 12.5 | 3 days supply | Qty: 10 | Fill #0

## 2016-12-01 DIAGNOSIS — Z3183 Encounter for assisted reproductive fertility procedure cycle: Secondary | ICD-10-CM | POA: Diagnosis not present

## 2016-12-06 DIAGNOSIS — Z3183 Encounter for assisted reproductive fertility procedure cycle: Secondary | ICD-10-CM | POA: Diagnosis not present

## 2016-12-09 DIAGNOSIS — R5082 Postprocedural fever: Secondary | ICD-10-CM | POA: Diagnosis not present

## 2016-12-19 DIAGNOSIS — J4541 Moderate persistent asthma with (acute) exacerbation: Secondary | ICD-10-CM | POA: Diagnosis not present

## 2016-12-23 DIAGNOSIS — R5082 Postprocedural fever: Secondary | ICD-10-CM | POA: Diagnosis not present

## 2016-12-23 DIAGNOSIS — Z3183 Encounter for assisted reproductive fertility procedure cycle: Secondary | ICD-10-CM | POA: Diagnosis not present

## 2016-12-24 MED FILL — ESTRADIOL 0.1 MG PATCH: 0.1 | 28 days supply | Qty: 8 | Fill #1

## 2017-01-01 DIAGNOSIS — Z3183 Encounter for assisted reproductive fertility procedure cycle: Secondary | ICD-10-CM | POA: Diagnosis not present

## 2017-01-08 MED FILL — ESTRADIOL 2 MG TABLET: 2 | 30 days supply | Qty: 60 | Fill #1

## 2017-01-11 DIAGNOSIS — Z3200 Encounter for pregnancy test, result unknown: Secondary | ICD-10-CM | POA: Diagnosis not present

## 2017-01-11 DIAGNOSIS — R5082 Postprocedural fever: Secondary | ICD-10-CM | POA: Diagnosis not present

## 2017-01-11 DIAGNOSIS — Z3183 Encounter for assisted reproductive fertility procedure cycle: Secondary | ICD-10-CM | POA: Diagnosis not present

## 2017-01-18 MED FILL — ESCITALOPRAM 10 MG TABLET: 10 | 90 days supply | Qty: 90 | Fill #2

## 2017-01-18 MED FILL — ESTRADIOL 0.1 MG PATCH: 0.1 | 28 days supply | Qty: 8 | Fill #2

## 2017-01-18 MED FILL — BD NEEDLES 22GX1.5: 22G X 1-1/2 | 30 days supply | Qty: 30 | Fill #1

## 2017-01-18 MED FILL — BD NEEDLES 22GX1.5": 22G X 1-1/2 | 30 days supply | Qty: 30 | Fill #1

## 2017-01-18 MED FILL — PROGESTERONE OIL 50 MG/ML V: 50 | 20 days supply | Qty: 20 | Fill #1

## 2017-01-29 DIAGNOSIS — Z3200 Encounter for pregnancy test, result unknown: Secondary | ICD-10-CM | POA: Diagnosis not present

## 2017-01-29 DIAGNOSIS — R5082 Postprocedural fever: Secondary | ICD-10-CM | POA: Diagnosis not present

## 2017-01-29 DIAGNOSIS — Z3183 Encounter for assisted reproductive fertility procedure cycle: Secondary | ICD-10-CM | POA: Diagnosis not present

## 2017-02-05 DIAGNOSIS — N8500 Endometrial hyperplasia, unspecified: Secondary | ICD-10-CM | POA: Diagnosis not present

## 2017-02-05 DIAGNOSIS — Z3141 Encounter for fertility testing: Secondary | ICD-10-CM | POA: Diagnosis not present

## 2017-02-12 MED FILL — PANTOPRAZOLE SOD DR 20 MG T: 20 | 90 days supply | Qty: 90 | Fill #1

## 2017-02-12 MED FILL — metroNIDAZOLE 500 MG TABS: 500 | 10 days supply | Qty: 20 | Fill #0

## 2017-02-12 MED FILL — CIPROFLOXACIN HCL 500 MG TA: 500 | 10 days supply | Qty: 20 | Fill #0

## 2017-02-12 MED FILL — ESTRADIOL 0.1 MG PATCH: 0.1 | 28 days supply | Qty: 8 | Fill #0

## 2017-02-15 ENCOUNTER — Encounter: Payer: Self-pay | Admitting: Family Medicine

## 2017-02-15 ENCOUNTER — Ambulatory Visit (INDEPENDENT_AMBULATORY_CARE_PROVIDER_SITE_OTHER): Payer: 59 | Admitting: Family Medicine

## 2017-02-15 ENCOUNTER — Encounter: Payer: Self-pay | Admitting: *Deleted

## 2017-02-15 VITALS — BP 120/80 | HR 74 | Temp 98.8°F | Ht 64.25 in | Wt 216.0 lb

## 2017-02-15 DIAGNOSIS — N979 Female infertility, unspecified: Secondary | ICD-10-CM | POA: Diagnosis not present

## 2017-02-15 DIAGNOSIS — Z9989 Dependence on other enabling machines and devices: Secondary | ICD-10-CM | POA: Diagnosis not present

## 2017-02-15 DIAGNOSIS — J4540 Moderate persistent asthma, uncomplicated: Secondary | ICD-10-CM

## 2017-02-15 DIAGNOSIS — G4733 Obstructive sleep apnea (adult) (pediatric): Secondary | ICD-10-CM

## 2017-02-15 DIAGNOSIS — F411 Generalized anxiety disorder: Secondary | ICD-10-CM | POA: Diagnosis not present

## 2017-02-15 DIAGNOSIS — K219 Gastro-esophageal reflux disease without esophagitis: Secondary | ICD-10-CM | POA: Diagnosis not present

## 2017-02-15 HISTORY — DX: Female infertility, unspecified: N97.9

## 2017-02-15 MED ORDER — ESCITALOPRAM OXALATE 10 MG PO TABS: 10.0000 mg | ORAL_TABLET | Freq: Every day | ORAL | 3 refills | Status: DC

## 2017-02-15 MED ORDER — PANTOPRAZOLE SODIUM 20 MG PO TBEC: 20.0000 mg | DELAYED_RELEASE_TABLET | Freq: Every day | ORAL | 3 refills | Status: DC

## 2017-02-15 NOTE — Progress Notes (Signed)
Subjective  CC:  Chief Complaint  Patient presents with  . Establish Care    Transfer from [place]  . Anxiety    HPI: Monica Benjamin is a 37 y.o. female who presents to [place] [facility] ([place]) today to establish care with me as a new patient. She is a former [facility] patient and is here to reestablish care with me today.   She has the following concerns or needs:   Undergoing IVF- has had one failed embryo transfer; recent endometrial biopsy revealed endometriosis: on abx and preparing for next transfer. Handling stress well.   GAD: on lexapro; well controlled. Needs refills  Asthma and OSA on cpap per pulmonology. Doing well. Stable. Had one exacerbation in November with uri. Stable on meds. cpap helps sleep and awakens refreshed. Reviewed most recent records.   GERD with cough: well controlled on chronic PPI. Needs refill. Protonix 20 is controlling her sxs.   We updated and reviewed the patient's past history in detail and it is documented below.  Patient Active Problem List   Diagnosis Date Noted  . OSA on CPAP 05/25/2016    Priority: High  . Moderate persistent asthma without complication 16/10/9602    Priority: High  . Chronic seasonal allergic rhinitis due to pollen 11/25/2015    Priority: Medium  . GAD (generalized anxiety disorder) 11/25/2015    Priority: Medium  . Gastroesophageal reflux disease without esophagitis 11/25/2015    Priority: Medium  . Irregular menstrual cycle 11/25/2015    Priority: Medium  . Irritable bowel syndrome with both constipation and diarrhea 11/25/2015    Priority: Medium  . Infertility, female 02/15/2017   Health Maintenance  Topic Date Due  . PAP SMEAR  05/27/2018  . TETANUS/TDAP  11/01/2023  . INFLUENZA VACCINE  Completed  . HIV Screening  Completed   Immunization History  Administered Date(s) Administered  . HPV Quadrivalent 10/09/2004, 12/17/2004, 04/29/2005  . Influenza, Seasonal, Injecte,  Preservative Fre 10/28/2015  . Influenza-Unspecified 10/07/2016  . Tdap 10/12/2006, 10/31/2013   Current Meds  Medication Sig  . estradiol (ESTRACE) 2 MG tablet TAKE 1 TABLET BY MOUTH TWICE DAILY  . estradiol (VIVELLE-DOT) 0.1 MG/24HR patch APPLY 1 PATCH EVERY 3 DAYS  . pantoprazole (PROTONIX) 20 MG tablet Take 1 tablet (20 mg total) by mouth daily.  . [DISCONTINUED] pantoprazole (PROTONIX) 20 MG tablet TAKE 1 TABLET BY MOUTH DAILY    Allergies: Patient is allergic to nsaids. Past Medical History Patient  has a past medical history of Allergy, Anxiety, Asthma, Depression, Infertility, female (02/15/2017), Migraines, and Trigeminal neuralgia. Past Surgical History Patient  has a past surgical history that includes Wisdom tooth extraction. Family History: Patient family history includes COPD in her paternal grandmother; Dementia in her maternal grandmother; Diabetes in her maternal grandfather; Migraines in her mother and sister; Osteoporosis in her mother. Social History:  Patient  reports that  has never smoked. she has never used smokeless tobacco. She reports that she drinks alcohol. She reports that she does not use drugs.  Review of Systems: Constitutional: negative for fever or malaise Ophthalmic: negative for photophobia, double vision or loss of vision Cardiovascular: negative for chest pain, dyspnea on exertion, or new LE swelling Respiratory: negative for SOB or persistent cough Gastrointestinal: negative for abdominal pain, change in bowel habits or melena, + nausea due to abx Genitourinary: negative for dysuria or gross hematuria Musculoskeletal: negative for new gait disturbance or muscular weakness Integumentary: negative for new or persistent rashes Neurological:  negative for TIA or stroke symptoms Psychiatric: negative for SI or delusions Allergic/Immunologic: negative for hives  Patient Care Team    Relationship Specialty Notifications Start End  Leamon Arnt,  MD PCP - General Family Medicine  07/22/16   Dionne Milo, MD Referring Physician Pulmonary Disease  02/15/17   Governor Specking, MD Consulting Physician Obstetrics and Gynecology  02/15/17     Objective  Vitals: BP 120/80 (BP Location: Left Arm, Patient Position: Sitting, Cuff Size: Large)   Pulse 74   Temp 98.8 F (37.1 C) (Oral)   Ht 5' 4.25" (1.632 m)   Wt 216 lb (98 kg)   LMP 02/09/2017   SpO2 98%   BMI 36.79 kg/m  General:  Well developed, well nourished, no acute distress  Psych:  Alert and oriented,normal mood and affect HEENT:  Normocephalic, atraumatic, non-icteric sclera, PERRL, oropharynx is without mass or exudate, supple neck without adenopathy, mass or thyromegaly Cardiovascular:  RRR without gallop, rub or murmur, nondisplaced PMI Respiratory:  Good breath sounds bilaterally, CTAB with normal respiratory effort Neurologic:    Mental status is normal. Normal gait  Assessment  1. GAD (generalized anxiety disorder)   2. Gastroesophageal reflux disease without esophagitis   3. Moderate persistent asthma without complication   4. OSA on CPAP   5. Infertility, female      Plan   Chronic medical problems are well controlled. Continue current medications. Refills placed. Continue IVF; will schedule CPE with me in 6-12 months.   Follow up:  6-12 months for cpe  Commons side effects, risks, benefits, and alternatives for medications and treatment plan prescribed today were discussed, and the patient expressed understanding of the given instructions. Patient is instructed to call or message via MyChart if he/she has any questions or concerns regarding our treatment plan. No barriers to understanding were identified. We discussed Red Flag symptoms and signs in detail. Patient expressed understanding regarding what to do in case of urgent or emergency type symptoms.   Medication list was reconciled, printed and provided to the patient in AVS. Patient instructions and summary  information was reviewed with the patient as documented in the AVS. This note was prepared with assistance of Dragon voice recognition software. Occasional wrong-word or sound-a-like substitutions may have occurred due to the inherent limitations of voice recognition software  No orders of the defined types were placed in this encounter.  Meds ordered this encounter  Medications  . escitalopram (LEXAPRO) 10 MG tablet    Sig: Take 1 tablet (10 mg total) by mouth daily.    Dispense:  90 tablet    Refill:  3  . pantoprazole (PROTONIX) 20 MG tablet    Sig: Take 1 tablet (20 mg total) by mouth daily.    Dispense:  90 tablet    Refill:  3

## 2017-02-15 NOTE — Patient Instructions (Signed)
It was so good seeing you again! Thank you for establishing with my new practice and allowing me to continue caring for you. It means a lot to me.   Please schedule a follow up appointment with me in 6-12 months.  Please do these things to maintain good health!   Exercise at least 30-45 minutes a day,  4-5 days a week.   Eat a low-fat diet with lots of fruits and vegetables, up to 7-9 servings per day.  Drink plenty of water daily. Try to drink 8 8oz glasses per day.  Seatbelts can save your life. Always wear your seatbelt.  Place Smoke Detectors on every level of your home and check batteries every year.  Schedule an appointment with an eye doctor for an eye exam every 1-2 years  Safe sex - use condoms to protect yourself from STDs if you could be exposed to these types of infections. Use birth control if you do not want to become pregnant and are sexually active.  Avoid heavy alcohol use. If you drink, keep it to less than 2 drinks/day and not every day.  [place].  Choose someone you trust that could speak for you if you became unable to speak for yourself.  Depression is common in our stressful world.If you're feeling down or losing interest in things you normally enjoy, please come in for a visit.  If anyone is threatening or hurting you, please get help. Physical or Emotional Violence is never OK.

## 2017-02-16 MED FILL — ESTRADIOL 2 MG TABLET: 2 | 30 days supply | Qty: 60 | Fill #2

## 2017-02-19 DIAGNOSIS — Z3200 Encounter for pregnancy test, result unknown: Secondary | ICD-10-CM | POA: Diagnosis not present

## 2017-02-19 DIAGNOSIS — Z3183 Encounter for assisted reproductive fertility procedure cycle: Secondary | ICD-10-CM | POA: Diagnosis not present

## 2017-02-19 DIAGNOSIS — R5082 Postprocedural fever: Secondary | ICD-10-CM | POA: Diagnosis not present

## 2017-02-23 MED FILL — MEDROXYPROGESTERONE 10 MG T: 10 | 5 days supply | Qty: 5 | Fill #0

## 2017-02-27 DIAGNOSIS — R9389 Abnormal findings on diagnostic imaging of other specified body structures: Secondary | ICD-10-CM | POA: Diagnosis not present

## 2017-02-27 DIAGNOSIS — Q512 Other doubling of uterus, unspecified: Secondary | ICD-10-CM | POA: Diagnosis not present

## 2017-03-01 MED FILL — MEDROXYPROGESTERONE 10 MG T: 10 | 5 days supply | Qty: 5 | Fill #0

## 2017-03-04 MED FILL — ESTRADIOL 2 MG TABLET: 2 | 30 days supply | Qty: 90 | Fill #0

## 2017-03-31 DIAGNOSIS — Z3141 Encounter for fertility testing: Secondary | ICD-10-CM | POA: Diagnosis not present

## 2017-04-01 MED FILL — DOXYCYCLINE HYCLATE 100 MG: 100 | 5 days supply | Qty: 10 | Fill #0

## 2017-04-15 MED FILL — ESCITALOPRAM 10 MG TABLET: 10 | 90 days supply | Qty: 90 | Fill #3

## 2017-04-15 MED FILL — ESTRADIOL 2 MG TABLET: 2 | 30 days supply | Qty: 90 | Fill #1

## 2017-04-19 ENCOUNTER — Telehealth: Payer: 59 | Admitting: Nurse Practitioner

## 2017-04-19 DIAGNOSIS — R05 Cough: Secondary | ICD-10-CM

## 2017-04-19 DIAGNOSIS — R059 Cough, unspecified: Secondary | ICD-10-CM

## 2017-04-19 MED ORDER — AZITHROMYCIN 250 MG PO TABS: ORAL_TABLET | ORAL | 0 refills | Status: DC

## 2017-04-19 MED FILL — AZITHROMYCIN 250 MG TABLET: 250 | 5 days supply | Qty: 6 | Fill #0

## 2017-04-19 NOTE — Progress Notes (Signed)
We are sorry that you are not feeling well.  Here is how we plan to help!  Based on your presentation I believe you most likely have A cough due to bacteria.  When patients have a fever and a productive cough with a change in color or increased sputum production, we are concerned about bacterial bronchitis.  If left untreated it can progress to pneumonia.  If your symptoms do not improve with your treatment plan it is important that you contact your provider.   I have prescribed Azithromyin 250 mg: two tablets now and then one tablet daily for 4 additonal days    In addition you may use A non-prescription cough medication called Mucinex DM: take 2 tablets every 12 hours.  So sorry but cannot due cough meds with codeine in an e visit.  From your responses in the eVisit questionnaire you describe inflammation in the upper respiratory tract which is causing a significant cough.  This is commonly called Bronchitis and has four common causes:    Allergies  Viral Infections  Acid Reflux  Bacterial Infection Allergies, viruses and acid reflux are treated by controlling symptoms or eliminating the cause. An example might be a cough caused by taking certain blood pressure medications. You stop the cough by changing the medication. Another example might be a cough caused by acid reflux. Controlling the reflux helps control the cough.  USE OF BRONCHODILATOR ("RESCUE") INHALERS: There is a risk from using your bronchodilator too frequently.  The risk is that over-reliance on a medication which only relaxes the muscles surrounding the breathing tubes can reduce the effectiveness of medications prescribed to reduce swelling and congestion of the tubes themselves.  Although you feel brief relief from the bronchodilator inhaler, your asthma may actually be worsening with the tubes becoming more swollen and filled with mucus.  This can delay other crucial treatments, such as oral steroid medications. If you need  to use a bronchodilator inhaler daily, several times per day, you should discuss this with your provider.  There are probably better treatments that could be used to keep your asthma under control.     HOME CARE . Only take medications as instructed by your medical team. . Complete the entire course of an antibiotic. . Drink plenty of fluids and get plenty of rest. . Avoid close contacts especially the very young and the elderly . Cover your mouth if you cough or cough into your sleeve. . Always remember to wash your hands . A steam or ultrasonic humidifier can help congestion.   GET HELP RIGHT AWAY IF: . You develop worsening fever. . You become short of breath . You cough up blood. . Your symptoms persist after you have completed your treatment plan MAKE SURE YOU   Understand these instructions.  Will watch your condition.  Will get help right away if you are not doing well or get worse.  Your e-visit answers were reviewed by a board certified advanced clinical practitioner to complete your personal care plan.  Depending on the condition, your plan could have included both over the counter or prescription medications. If there is a problem please reply  once you have received a response from your provider. Your safety is important to [place].  If you have drug allergies check your prescription carefully.    You can use MyChart to ask questions about today's visit, request a non-urgent call back, or ask for a work or school excuse for 24 hours related to this  e-Visit. If it has been greater than 24 hours you will need to follow up with your provider, or enter a new e-Visit to address those concerns. You will get an e-mail in the next two days asking about your experience.  I hope that your e-visit has been valuable and will speed your recovery. Thank you for using e-visits.

## 2017-04-26 MED FILL — PANTOPRAZOLE SOD DR 20 MG T: 20 | 90 days supply | Qty: 90 | Fill #0

## 2017-05-17 DIAGNOSIS — R5082 Postprocedural fever: Secondary | ICD-10-CM | POA: Diagnosis not present

## 2017-05-17 DIAGNOSIS — Z3183 Encounter for assisted reproductive fertility procedure cycle: Secondary | ICD-10-CM | POA: Diagnosis not present

## 2017-05-17 MED FILL — MEDROXYPROGESTERONE 10 MG T: 10 | 5 days supply | Qty: 5 | Fill #0

## 2017-05-28 MED FILL — ESTRADIOL 2 MG TABLET: 2 | 30 days supply | Qty: 90 | Fill #2

## 2017-06-01 DIAGNOSIS — Z319 Encounter for procreative management, unspecified: Secondary | ICD-10-CM | POA: Diagnosis not present

## 2017-06-09 DIAGNOSIS — Z3183 Encounter for assisted reproductive fertility procedure cycle: Secondary | ICD-10-CM | POA: Diagnosis not present

## 2017-06-09 DIAGNOSIS — Z3200 Encounter for pregnancy test, result unknown: Secondary | ICD-10-CM | POA: Diagnosis not present

## 2017-06-09 DIAGNOSIS — R5082 Postprocedural fever: Secondary | ICD-10-CM | POA: Diagnosis not present

## 2017-06-11 ENCOUNTER — Telehealth: Payer: Self-pay | Admitting: Certified Nurse Midwife

## 2017-06-11 NOTE — Telephone Encounter (Signed)
Left message for patient to reschedule appointment.

## 2017-06-14 MED FILL — PROGESTERONE OIL 50 MG/ML V: 50 | 20 days supply | Qty: 20 | Fill #2

## 2017-06-14 MED FILL — ESTRADIOL 0.1 MG PATCH: 0.1 | 28 days supply | Qty: 8 | Fill #1

## 2017-06-16 ENCOUNTER — Encounter: Payer: Self-pay | Admitting: Certified Nurse Midwife

## 2017-06-16 DIAGNOSIS — Z3183 Encounter for assisted reproductive fertility procedure cycle: Secondary | ICD-10-CM | POA: Diagnosis not present

## 2017-06-24 DIAGNOSIS — Z3200 Encounter for pregnancy test, result unknown: Secondary | ICD-10-CM | POA: Diagnosis not present

## 2017-07-01 MED FILL — ZARXIO 300 MCG/0.5ML SOSY: 300 | 30 days supply | Qty: 1 | Fill #0

## 2017-07-06 DIAGNOSIS — J4541 Moderate persistent asthma with (acute) exacerbation: Secondary | ICD-10-CM | POA: Diagnosis not present

## 2017-07-06 MED FILL — predniSONE 20 MG TABS: 20 | 5 days supply | Qty: 15 | Fill #0

## 2017-07-06 MED FILL — AMOX-CLAV 875-125 MG TABLET: 875-125 | 10 days supply | Qty: 20 | Fill #0

## 2017-07-08 DIAGNOSIS — Z3183 Encounter for assisted reproductive fertility procedure cycle: Secondary | ICD-10-CM | POA: Diagnosis not present

## 2017-07-08 DIAGNOSIS — N978 Female infertility of other origin: Secondary | ICD-10-CM | POA: Diagnosis not present

## 2017-07-13 MED FILL — ESCITALOPRAM 10 MG TABLET: 10 | 90 days supply | Qty: 90 | Fill #0

## 2017-07-13 MED FILL — BD 3 ML SYRINGE 18GX1-1/2": 18G X 1-1/2 | 30 days supply | Qty: 60 | Fill #1

## 2017-07-13 MED FILL — METHYLPREDNISOLONE 4 MG TAB: 4 | 4 days supply | Qty: 16 | Fill #0

## 2017-07-13 MED FILL — ESTRADIOL 0.1 MG PATCH: 0.1 | 28 days supply | Qty: 8 | Fill #2

## 2017-07-13 MED FILL — ESTRADIOL 2 MG TABLET: 2 | 30 days supply | Qty: 90 | Fill #3

## 2017-07-13 MED FILL — BD 3 ML SYRINGE 18GX1-1/2: 18G X 1-1/2 | 30 days supply | Qty: 60 | Fill #1

## 2017-07-13 MED FILL — BD NEEDLES 22GX1.5: 22G X 1-1/2 | 30 days supply | Qty: 30 | Fill #2

## 2017-07-13 MED FILL — BD NEEDLES 22GX1.5": 22G X 1-1/2 | 30 days supply | Qty: 30 | Fill #2

## 2017-07-15 DIAGNOSIS — Z3183 Encounter for assisted reproductive fertility procedure cycle: Secondary | ICD-10-CM | POA: Diagnosis not present

## 2017-07-26 DIAGNOSIS — Z3200 Encounter for pregnancy test, result unknown: Secondary | ICD-10-CM | POA: Diagnosis not present

## 2017-07-28 ENCOUNTER — Ambulatory Visit: Payer: 59 | Admitting: Certified Nurse Midwife

## 2017-07-28 ENCOUNTER — Ambulatory Visit: Payer: 59 | Admitting: Nurse Practitioner

## 2017-08-09 MED FILL — PANTOPRAZOLE SOD DR 20 MG T: 20 | 90 days supply | Qty: 90 | Fill #1

## 2017-08-16 ENCOUNTER — Ambulatory Visit: Payer: Self-pay | Admitting: *Deleted

## 2017-08-16 ENCOUNTER — Ambulatory Visit: Payer: Self-pay | Admitting: Family Medicine

## 2017-08-16 NOTE — Telephone Encounter (Signed)
  Pt called with having elevated b/ps. At work now and b/p is 138/105 and then 137/97 with some sweating and states a mild headache. She states that she has been under a lot of stress. Her b/p has been elevated in the office per patient.  Denies having weakness one side or other of her body, blurred vision, chest pain or difficulty breathing. She is having some nausea.  Advised that if she starts having more symptoms, she would need to be seen at the emergency department. Pt voiced understanding. She is able to check her b/p at home. Conferred with flow at [facility], pt is scheduled for in the morning with Dr. Jonni Sanger.  Pt voiced understanding.  Reason for Disposition . Systolic BP  >= 103 OR Diastolic >= 159  Answer Assessment - Initial Assessment Questions 1. BLOOD PRESSURE: "What is the blood pressure?" "Did you take at least two measurements 5 minutes apart?"     138/105 and  137/97  [id number] 2. ONSET: "When did you take your blood pressure?"     now 3. HOW: "How did you obtain the blood pressure?" (e.g., visiting nurse, automatic home BP monitor)     Automatic bp monitor 4. HISTORY: "Do you have a history of high blood pressure?"     no 5. MEDICATIONS: "Are you taking any medications for blood pressure?" "Have you missed any doses recently?"     No meds 6. OTHER SYMPTOMS: "Do you have any symptoms?" (e.g., headache, chest pain, blurred vision, difficulty breathing, weakness)     Headache, sweating, dizziness  7. PREGNANCY: "Is there any chance you are pregnant?" "When was your last menstrual period?"     No LMP July 4th  Protocols used: HIGH BLOOD PRESSURE-A-AH

## 2017-08-17 ENCOUNTER — Encounter: Payer: Self-pay | Admitting: Family Medicine

## 2017-08-17 ENCOUNTER — Ambulatory Visit (INDEPENDENT_AMBULATORY_CARE_PROVIDER_SITE_OTHER): Payer: 59 | Admitting: Family Medicine

## 2017-08-17 ENCOUNTER — Other Ambulatory Visit: Payer: Self-pay

## 2017-08-17 VITALS — BP 138/98 | HR 88 | Temp 98.2°F | Ht 64.5 in

## 2017-08-17 DIAGNOSIS — I10 Essential (primary) hypertension: Secondary | ICD-10-CM | POA: Diagnosis not present

## 2017-08-17 DIAGNOSIS — K580 Irritable bowel syndrome with diarrhea: Secondary | ICD-10-CM

## 2017-08-17 DIAGNOSIS — F430 Acute stress reaction: Secondary | ICD-10-CM | POA: Diagnosis not present

## 2017-08-17 DIAGNOSIS — F411 Generalized anxiety disorder: Secondary | ICD-10-CM | POA: Diagnosis not present

## 2017-08-17 LAB — CBC WITH DIFFERENTIAL/PLATELET
BASOS ABS: 0 10*3/uL (ref 0.0–0.1)
Basophils Relative: 0.4 % (ref 0.0–3.0)
EOS ABS: 0.5 10*3/uL (ref 0.0–0.7)
Eosinophils Relative: 4.9 % (ref 0.0–5.0)
HEMATOCRIT: 40.6 % (ref 36.0–46.0)
Hemoglobin: 13.9 g/dL (ref 12.0–15.0)
LYMPHS PCT: 35.1 % (ref 12.0–46.0)
Lymphs Abs: 3.9 10*3/uL (ref 0.7–4.0)
MCHC: 34.3 g/dL (ref 30.0–36.0)
MCV: 90.6 fl (ref 78.0–100.0)
MONOS PCT: 5.5 % (ref 3.0–12.0)
Monocytes Absolute: 0.6 10*3/uL (ref 0.1–1.0)
NEUTROS ABS: 6 10*3/uL (ref 1.4–7.7)
Neutrophils Relative %: 54.1 % (ref 43.0–77.0)
Platelets: 298 10*3/uL (ref 150.0–400.0)
RBC: 4.48 Mil/uL (ref 3.87–5.11)
RDW: 13.6 % (ref 11.5–15.5)
WBC: 11.1 10*3/uL — AB (ref 4.0–10.5)

## 2017-08-17 LAB — LIPID PANEL
CHOL/HDL RATIO: 5
Cholesterol: 223 mg/dL — ABNORMAL HIGH (ref 0–200)
HDL: 40.7 mg/dL (ref >39.00)
Triglycerides: 429 mg/dL — ABNORMAL HIGH (ref 0.0–149.0)

## 2017-08-17 LAB — COMPREHENSIVE METABOLIC PANEL
ALT: 21 U/L (ref 0–35)
AST: 17 U/L (ref 0–37)
Albumin: 4.3 g/dL (ref 3.5–5.2)
Alkaline Phosphatase: 85 U/L (ref 39–117)
BILIRUBIN TOTAL: 0.5 mg/dL (ref 0.2–1.2)
BUN: 12 mg/dL (ref 6–23)
CO2: 28 meq/L (ref 19–32)
Calcium: 10 mg/dL (ref 8.4–10.5)
Chloride: 100 mEq/L (ref 96–112)
Creatinine, Ser: 0.87 mg/dL (ref 0.40–1.20)
GFR: 77.09 mL/min (ref >60.00)
GLUCOSE: 105 mg/dL — AB (ref 70–99)
Potassium: 4.2 mEq/L (ref 3.5–5.1)
Sodium: 137 mEq/L (ref 135–145)
Total Protein: 7.8 g/dL (ref 6.0–8.3)

## 2017-08-17 LAB — LDL CHOLESTEROL, DIRECT: Direct LDL: 117 mg/dL

## 2017-08-17 LAB — TSH: TSH: 1.54 u[IU]/mL (ref 0.35–4.50)

## 2017-08-17 MED ORDER — HYDROCHLOROTHIAZIDE 25 MG PO TABS: 12.5000 mg | ORAL_TABLET | Freq: Every day | ORAL | 2 refills | Status: DC

## 2017-08-17 MED ORDER — ESCITALOPRAM OXALATE 10 MG PO TABS: 20.0000 mg | ORAL_TABLET | Freq: Every day | ORAL | 3 refills | Status: DC

## 2017-08-17 MED FILL — HYDROCHLOROTHIAZIDE 25 MG T: 25 | 60 days supply | Qty: 30 | Fill #0

## 2017-08-17 NOTE — Progress Notes (Signed)
Subjective  CC:  Chief Complaint  Patient presents with  . Hypertension    was doing IVF, Blood Pressure has been elevated since then     HPI: Monica Benjamin is a 37 y.o. female who presents to the office today to address the problems listed above in the chief complaint.  Monica Benjamin has not been feeling very well.  Yesterday she was feeling fatigued and had a headache so checked her blood pressure at work.  It was very elevated: 140/105-ish.  She reports that over the last month she feels rundown.  She admits to multiple stressors including 3 failed IVF cycles, stressful work load, young daughter needing ear tubes due to multiple recurrent ear infections to be done next week.  She reports increased abdominal cramping with loose stools consistent with her IBS.  She reports multiple frontal headaches described as throbbing without nausea or vomiting or neuro deficits.  Also had multiple elevated blood pressures during her IVF cycles which they had attributed to the high dose hormone use.  Has history of anxiety disorder on Lexapro chronically.  Typically has controlled her well but as above noted, she is anxious.  She is also grieving the loss of the possibility of having her own child.  She is considering seeking out counseling for this.  Assessment  1. Essential hypertension   2. GAD (generalized anxiety disorder)   3. Stress reaction   4. Irritable bowel syndrome with diarrhea      Plan   Hypertension: Could be essential benign hypertension but also could be secondary to increased stress load.  Check lab work.  Counseling done.  Start low-dose diuretic.  Discussed stress reduction by different techniques including relaxation techniques, meditation, psychotherapy.  Recheck 1 month  Anxiety disorder and stress reaction: Increase Lexapro to 20 mg daily.  IBS secondary to stress.  Reassured  Follow up: 4 weeks to recheck blood pressure and stress  Orders Placed This Encounter    Procedures  . Comprehensive metabolic panel  . Lipid panel  . TSH  . CBC with Differential/Platelet   Meds ordered this encounter  Medications  . hydrochlorothiazide (HYDRODIURIL) 25 MG tablet    Sig: Take 0.5 tablets (12.5 mg total) by mouth daily.    Dispense:  30 tablet    Refill:  2  . escitalopram (LEXAPRO) 10 MG tablet    Sig: Take 2 tablets (20 mg total) by mouth daily.    Dispense:  90 tablet    Refill:  3      I reviewed the patients updated PMH, FH, and SocHx.    Patient Active Problem List   Diagnosis Date Noted  . OSA on CPAP 05/25/2016    Priority: High  . Moderate persistent asthma without complication 16/01/930    Priority: High  . Chronic seasonal allergic rhinitis due to pollen 11/25/2015    Priority: Medium  . GAD (generalized anxiety disorder) 11/25/2015    Priority: Medium  . Gastroesophageal reflux disease without esophagitis 11/25/2015    Priority: Medium  . Irregular menstrual cycle 11/25/2015    Priority: Medium  . Irritable bowel syndrome with both constipation and diarrhea 11/25/2015    Priority: Medium  . Infertility, female 02/15/2017   Current Meds  Medication Sig  . albuterol (PROVENTIL HFA;VENTOLIN HFA) 108 (90 Base) MCG/ACT inhaler Inhale 2 puffs into the lungs every 4 (four) hours as needed for wheezing (cough, shortness of breath or wheezing.).  Marland Kitchen escitalopram (LEXAPRO) 10 MG tablet Take 2 tablets (  20 mg total) by mouth daily.  Marland Kitchen loratadine (CLARITIN) 10 MG tablet Take 1 tablet by mouth daily.  . pantoprazole (PROTONIX) 20 MG tablet Take 1 tablet (20 mg total) by mouth daily.  . [DISCONTINUED] azithromycin (ZITHROMAX Z-PAK) 250 MG tablet As directed  . [DISCONTINUED] escitalopram (LEXAPRO) 10 MG tablet Take 1 tablet (10 mg total) by mouth daily.    Allergies: Patient is allergic to nsaids. Family History: Patient family history includes COPD in her paternal grandmother; Dementia in her maternal grandmother; Diabetes in her  maternal grandfather; Migraines in her mother and sister; Osteoporosis in her mother. Social History:  Patient  reports that she has never smoked. She has never used smokeless tobacco. She reports that she drinks alcohol. She reports that she does not use drugs.  Review of Systems: Constitutional: Negative for fever malaise or anorexia Cardiovascular: negative for chest pain Respiratory: negative for SOB or persistent cough Gastrointestinal: negative for abdominal pain  Objective  Vitals: BP (!) 138/98   Pulse 88   Temp 98.2 F (36.8 C)   Ht 5' 4.5" (1.638 m)   SpO2 98%   BMI 36.50 kg/m  General: no acute distress , A&Ox3 Psych: Flat affect, tearful, good insight HEENT: PEERL, conjunctiva normal, Oropharynx moist,neck is supple Cardiovascular:  RRR without murmur or gallop.  No peripheral edema Respiratory:  Good breath sounds bilaterally, CTAB with normal respiratory effort Skin:  Warm, no rashes     Commons side effects, risks, benefits, and alternatives for medications and treatment plan prescribed today were discussed, and the patient expressed understanding of the given instructions. Patient is instructed to call or message via MyChart if he/she has any questions or concerns regarding our treatment plan. No barriers to understanding were identified. We discussed Red Flag symptoms and signs in detail. Patient expressed understanding regarding what to do in case of urgent or emergency type symptoms.   Medication list was reconciled, printed and provided to the patient in AVS. Patient instructions and summary information was reviewed with the patient as documented in the AVS. This note was prepared with assistance of Dragon voice recognition software. Occasional wrong-word or sound-a-like substitutions may have occurred due to the inherent limitations of voice recognition software

## 2017-08-17 NOTE — Patient Instructions (Signed)
Please return in 4 weeks to recheck blood pressure and mood.   If you have any questions or concerns, please don't hesitate to send me a message via MyChart or call the office at [phone]. Thank you for visiting with [place] today! It's our pleasure caring for you.  Look up the Calm app Seek out EAP at work.

## 2017-09-14 ENCOUNTER — Telehealth: Payer: Self-pay | Admitting: Family Medicine

## 2017-09-14 ENCOUNTER — Ambulatory Visit: Payer: Self-pay | Admitting: Family Medicine

## 2017-09-14 MED ORDER — ESCITALOPRAM OXALATE 10 MG PO TABS: 20.0000 mg | ORAL_TABLET | Freq: Every day | ORAL | 0 refills | Status: DC

## 2017-09-14 NOTE — Telephone Encounter (Signed)
Request for refill of Lexapro; LRF 02/15/17; # 90; RF x 3  LOV: 08/17/17; dose increased from 10 mg qd to 10 mg, take 2 tabs qd. PCP: Dr. Jonni Sanger Pharmacy: [facility]   Refill sent for 10 mg. tabs; take 2 tabs to equal 20 mg qd. Per recommendation of Dr. Jonni Sanger in [place] 08/17/17

## 2017-09-14 NOTE — Telephone Encounter (Signed)
Copied from [place] [id number]. Topic: Quick Communication - See Telephone Encounter >> Sep 14, 2017 10:02 AM Hewitt Shorts wrote: Pt is needing a refill on her lexapro 20mg    [facility] out patient pharmacy   [phone]

## 2017-09-17 MED FILL — ESCITALOPRAM 10 MG TABLET: 10 | 90 days supply | Qty: 180 | Fill #0

## 2017-09-22 ENCOUNTER — Encounter: Payer: Self-pay | Admitting: Family Medicine

## 2017-09-22 ENCOUNTER — Ambulatory Visit (INDEPENDENT_AMBULATORY_CARE_PROVIDER_SITE_OTHER): Payer: 59 | Admitting: Family Medicine

## 2017-09-22 ENCOUNTER — Other Ambulatory Visit: Payer: Self-pay

## 2017-09-22 VITALS — BP 138/88 | HR 74 | Temp 98.8°F | Ht 64.5 in | Wt 216.8 lb

## 2017-09-22 DIAGNOSIS — F430 Acute stress reaction: Secondary | ICD-10-CM | POA: Diagnosis not present

## 2017-09-22 DIAGNOSIS — I10 Essential (primary) hypertension: Secondary | ICD-10-CM

## 2017-09-22 DIAGNOSIS — F411 Generalized anxiety disorder: Secondary | ICD-10-CM | POA: Diagnosis not present

## 2017-09-22 MED ORDER — HYDROCHLOROTHIAZIDE 25 MG PO TABS: 25.0000 mg | ORAL_TABLET | Freq: Every day | ORAL | 3 refills | Status: DC

## 2017-09-22 MED ORDER — ESCITALOPRAM OXALATE 20 MG PO TABS: 20.0000 mg | ORAL_TABLET | Freq: Every day | ORAL | 3 refills | Status: DC

## 2017-09-22 NOTE — Progress Notes (Signed)
Subjective  CC:  Chief Complaint  Patient presents with  . Anxiety    patient states she is doing some better  . Hypertension    HPI: Monica Benjamin is a 37 y.o. female who presents to the office today to address the problems listed above in the chief complaint.  Hypertension f/u: started low dose hctz last month. Control is improved. Pt reports she is doing well. taking medications as instructed, no medication side effects noted, no TIAs, no chest pain on exertion, no dyspnea on exertion, no swelling of ankles. She denies adverse effects from his BP medications. Compliance with medication is good.   GAD: increased lexapro dose; no significant change. No AEs. Feeling better. Has set up counseling next month.   Assessment  1. Essential hypertension   2. Stress reaction   3. GAD (generalized anxiety disorder)      Plan    Hypertension f/u: BP control is fairly well controlled. Increase to full dose hctz and recheck in 3 months. Check labs at that time  Hyperlipidemia f/u: check next visit  GAD: continue meds and agree with counseling. Stablilizing.   HM: has Gyn exam next month. Will get flu shot at work.  Education regarding management of these chronic disease states was given. Management strategies discussed on successive visits include dietary and exercise recommendations, goals of achieving and maintaining IBW, and lifestyle modifications aiming for adequate sleep and minimizing stressors.   Follow up: Return in about 3 months (around 12/23/2017) for follow up Hypertension.  No orders of the defined types were placed in this encounter.  Meds ordered this encounter  Medications  . hydrochlorothiazide (HYDRODIURIL) 25 MG tablet    Sig: Take 1 tablet (25 mg total) by mouth daily.    Dispense:  90 tablet    Refill:  3  . escitalopram (LEXAPRO) 20 MG tablet    Sig: Take 1 tablet (20 mg total) by mouth daily.    Dispense:  90 tablet    Refill:  3      BP  Readings from Last 3 Encounters:  09/22/17 138/88  08/17/17 (!) 138/98  02/15/17 120/80   Wt Readings from Last 3 Encounters:  09/22/17 216 lb 12.8 oz (98.3 kg)  02/15/17 216 lb (98 kg)  07/22/16 216 lb (98 kg)    Lab Results  Component Value Date   CHOL 223 (H) 08/17/2017   Lab Results  Component Value Date   HDL 40.70 08/17/2017   No results found for: [facility] Lab Results  Component Value Date   TRIG (H) 08/17/2017    429.0 Triglyceride is over 400; calculations on Lipids are invalid.   Lab Results  Component Value Date   CHOLHDL 5 08/17/2017   Lab Results  Component Value Date   LDLDIRECT 117.0 08/17/2017   Lab Results  Component Value Date   CREATININE 0.87 08/17/2017   BUN 12 08/17/2017   NA 137 08/17/2017   K 4.2 08/17/2017   CL 100 08/17/2017   CO2 28 08/17/2017    The ASCVD Risk score Mikey Bussing DC Jr., et al., 2013) failed to calculate for the following reasons:   The 2013 ASCVD risk score is only valid for ages 96 to 61  I reviewed the patients updated PMH, FH, and SocHx.    Patient Active Problem List   Diagnosis Date Noted  . OSA on CPAP 05/25/2016    Priority: High  . Moderate persistent asthma without complication 76/16/0737    Priority:  High  . Chronic seasonal allergic rhinitis due to pollen 11/25/2015    Priority: Medium  . GAD (generalized anxiety disorder) 11/25/2015    Priority: Medium  . Gastroesophageal reflux disease without esophagitis 11/25/2015    Priority: Medium  . Irregular menstrual cycle 11/25/2015    Priority: Medium  . Irritable bowel syndrome with both constipation and diarrhea 11/25/2015    Priority: Medium  . Infertility, female 02/15/2017    Allergies: Nsaids  Social History: Patient  reports that she has never smoked. She has never used smokeless tobacco. She reports that she drinks alcohol. She reports that she does not use drugs.  Current Meds  Medication Sig  . albuterol (PROVENTIL HFA;VENTOLIN HFA) 108 (90  Base) MCG/ACT inhaler Inhale 2 puffs into the lungs every 4 (four) hours as needed for wheezing (cough, shortness of breath or wheezing.).  Marland Kitchen budesonide-formoterol (SYMBICORT) 80-4.5 MCG/ACT inhaler Inhale 2 puffs into the lungs 2 (two) times daily.   Marland Kitchen escitalopram (LEXAPRO) 20 MG tablet Take 1 tablet (20 mg total) by mouth daily.  . hydrochlorothiazide (HYDRODIURIL) 25 MG tablet Take 1 tablet (25 mg total) by mouth daily.  Marland Kitchen loratadine (CLARITIN) 10 MG tablet Take 1 tablet by mouth daily.  . pantoprazole (PROTONIX) 20 MG tablet Take 1 tablet (20 mg total) by mouth daily.  . [DISCONTINUED] escitalopram (LEXAPRO) 10 MG tablet Take 2 tablets (20 mg total) by mouth daily.  . [DISCONTINUED] hydrochlorothiazide (HYDRODIURIL) 25 MG tablet Take 0.5 tablets (12.5 mg total) by mouth daily.    Review of Systems: Cardiovascular: negative for chest pain, palpitations, leg swelling, orthopnea Respiratory: negative for SOB, wheezing or persistent cough Gastrointestinal: negative for abdominal pain Genitourinary: negative for dysuria or gross hematuria  Objective  Vitals: BP 138/88   Pulse 74   Temp 98.8 F (37.1 C)   Ht 5' 4.5" (1.638 m)   Wt 216 lb 12.8 oz (98.3 kg)   SpO2 98%   BMI 36.64 kg/m  General: no acute distress  Psych:  Alert and oriented, normal mood and affect HEENT:  Normocephalic, atraumatic, supple neck  Cardiovascular:  RRR without murmur. no edema Respiratory:  Good breath sounds bilaterally, CTAB with normal respiratory effort Skin:  Warm, no rashes Neurologic:   Mental status is normal  Commons side effects, risks, benefits, and alternatives for medications and treatment plan prescribed today were discussed, and the patient expressed understanding of the given instructions. Patient is instructed to call or message via MyChart if he/she has any questions or concerns regarding our treatment plan. No barriers to understanding were identified. We discussed Red Flag symptoms and  signs in detail. Patient expressed understanding regarding what to do in case of urgent or emergency type symptoms.   Medication list was reconciled, printed and provided to the patient in AVS. Patient instructions and summary information was reviewed with the patient as documented in the AVS. This note was prepared with assistance of Dragon voice recognition software. Occasional wrong-word or sound-a-like substitutions may have occurred due to the inherent limitations of voice recognition software

## 2017-09-22 NOTE — Patient Instructions (Addendum)
Please return in 3 months for follow up of your hypertension. I'll check your blood work at that time.    If you have any questions or concerns, please don't hesitate to send me a message via MyChart or call the office at [phone]. Thank you for visiting with [place] today! It's our pleasure caring for you.  Good luck with counseling and Laila. Let me know when you get your flu shot.

## 2017-09-23 ENCOUNTER — Telehealth: Payer: 59 | Admitting: Physician Assistant

## 2017-09-23 DIAGNOSIS — N760 Acute vaginitis: Secondary | ICD-10-CM

## 2017-09-23 MED ORDER — FLUCONAZOLE 150 MG PO TABS: 150.0000 mg | ORAL_TABLET | Freq: Once | ORAL | 0 refills | Status: AC

## 2017-09-23 MED FILL — FLUCONAZOLE 150 MG TABS: 150 | 1 days supply | Qty: 1 | Fill #0

## 2017-09-23 NOTE — Progress Notes (Signed)

## 2017-10-05 ENCOUNTER — Telehealth: Payer: 59 | Admitting: Family

## 2017-10-05 DIAGNOSIS — B9689 Other specified bacterial agents as the cause of diseases classified elsewhere: Secondary | ICD-10-CM

## 2017-10-05 DIAGNOSIS — J019 Acute sinusitis, unspecified: Secondary | ICD-10-CM

## 2017-10-05 MED ORDER — AMOXICILLIN-POT CLAVULANATE 875-125 MG PO TABS: 1.0000 | ORAL_TABLET | Freq: Two times a day (BID) | ORAL | 0 refills | Status: DC

## 2017-10-05 MED FILL — AMOX-CLAV 875-125 MG TABLET: 875-125 | 7 days supply | Qty: 14 | Fill #0

## 2017-10-05 NOTE — Progress Notes (Signed)

## 2017-10-06 MED FILL — HYDROCHLOROTHIAZIDE 25 MG T: 25 | 60 days supply | Qty: 30 | Fill #1

## 2017-10-08 ENCOUNTER — Encounter: Payer: Self-pay | Admitting: Certified Nurse Midwife

## 2017-10-08 ENCOUNTER — Other Ambulatory Visit: Payer: Self-pay

## 2017-10-08 ENCOUNTER — Ambulatory Visit (INDEPENDENT_AMBULATORY_CARE_PROVIDER_SITE_OTHER): Payer: 59 | Admitting: Certified Nurse Midwife

## 2017-10-08 VITALS — BP 118/68 | HR 68 | Resp 16 | Ht 64.25 in | Wt 215.0 lb

## 2017-10-08 DIAGNOSIS — B373 Candidiasis of vulva and vagina: Secondary | ICD-10-CM | POA: Diagnosis not present

## 2017-10-08 DIAGNOSIS — Z8679 Personal history of other diseases of the circulatory system: Secondary | ICD-10-CM | POA: Diagnosis not present

## 2017-10-08 DIAGNOSIS — E663 Overweight: Secondary | ICD-10-CM

## 2017-10-08 DIAGNOSIS — Z01411 Encounter for gynecological examination (general) (routine) with abnormal findings: Secondary | ICD-10-CM | POA: Diagnosis not present

## 2017-10-08 DIAGNOSIS — B3731 Acute candidiasis of vulva and vagina: Secondary | ICD-10-CM

## 2017-10-08 MED ORDER — FLUCONAZOLE 150 MG PO TABS: ORAL_TABLET | ORAL | 0 refills | Status: DC

## 2017-10-08 MED ORDER — NYSTATIN-TRIAMCINOLONE 100000-0.1 UNIT/GM-% EX OINT: TOPICAL_OINTMENT | CUTANEOUS | 0 refills | Status: DC

## 2017-10-08 MED FILL — NYSTATIN-TRIAMCINOLONE OINT: 100000-0.1 | 7 days supply | Qty: 30 | Fill #0

## 2017-10-08 MED FILL — FLUCONAZOLE 150 MG TABS: 150 | 6 days supply | Qty: 2 | Fill #0

## 2017-10-08 NOTE — Patient Instructions (Signed)
EXERCISE AND DIET:  We recommended that you start or continue a regular exercise program for good health. Regular exercise means any activity that makes your heart beat faster and makes you sweat.  We recommend exercising at least 30 minutes per day at least 3 days a week, preferably 4 or 5.  We also recommend a diet low in fat and sugar.  Inactivity, poor dietary choices and obesity can cause diabetes, heart attack, stroke, and kidney damage, among others.    ALCOHOL AND SMOKING:  Women should limit their alcohol intake to no more than 7 drinks/beers/glasses of wine (combined, not each!) per week. Moderation of alcohol intake to this level decreases your risk of breast cancer and liver damage. And of course, no recreational drugs are part of a healthy lifestyle.  And absolutely no smoking or even second hand smoke. Most people know smoking can cause heart and lung diseases, but did you know it also contributes to weakening of your bones? Aging of your skin?  Yellowing of your teeth and nails?  CALCIUM AND VITAMIN D:  Adequate intake of calcium and Vitamin D are recommended.  The recommendations for exact amounts of these supplements seem to change often, but generally speaking 600 mg of calcium (either carbonate or citrate) and 800 units of Vitamin D per day seems prudent. Certain women may benefit from higher intake of Vitamin D.  If you are among these women, your doctor will have told you during your visit.    PAP SMEARS:  Pap smears, to check for cervical cancer or precancers,  have traditionally been done yearly, although recent scientific advances have shown that most women can have pap smears less often.  However, every woman still should have a physical exam from her gynecologist every year. It will include a breast check, inspection of the vulva and vagina to check for abnormal growths or skin changes, a visual exam of the cervix, and then an exam to evaluate the size and shape of the uterus and  ovaries.  And after 40 years of age, a rectal exam is indicated to check for rectal cancers. We will also provide age appropriate advice regarding health maintenance, like when you should have certain vaccines, screening for sexually transmitted diseases, bone density testing, colonoscopy, mammograms, etc.   MAMMOGRAMS:  All women over 40 years old should have a yearly mammogram. Many facilities now offer a "3D" mammogram, which may cost around $50 extra out of pocket. If possible,  we recommend you accept the option to have the 3D mammogram performed.  It both reduces the number of women who will be called back for extra views which then turn out to be normal, and it is better than the routine mammogram at detecting truly abnormal areas.    COLONOSCOPY:  Colonoscopy to screen for colon cancer is recommended for all women at age 50.  We know, you hate the idea of the prep.  We agree, BUT, having colon cancer and not knowing it is worse!!  Colon cancer so often starts as a polyp that can be seen and removed at colonscopy, which can quite literally save your life!  And if your first colonoscopy is normal and you have no family history of colon cancer, most women don't have to have it again for 10 years.  Once every ten years, you can do something that may end up saving your life, right?  We will be happy to help you get it scheduled when you are ready.    Be sure to check your insurance coverage so you understand how much it will cost.  It may be covered as a preventative service at no cost, but you should check your particular policy.      Vaginal Yeast infection, Adult Vaginal yeast infection is a condition that causes soreness, swelling, and redness (inflammation) of the vagina. It also causes vaginal discharge. This is a common condition. Some women get this infection frequently. What are the causes? This condition is caused by a change in the normal balance of the yeast (candida) and bacteria that live  in the vagina. This change causes an overgrowth of yeast, which causes the inflammation. What increases the risk? This condition is more likely to develop in:  Women who take antibiotic medicines.  Women who have diabetes.  Women who take birth control pills.  Women who are pregnant.  Women who douche often.  Women who have a weak defense (immune) system.  Women who have been taking steroid medicines for a long time.  Women who frequently wear tight clothing.  What are the signs or symptoms? Symptoms of this condition include:  White, thick vaginal discharge.  Swelling, itching, redness, and irritation of the vagina. The lips of the vagina (vulva) may be affected as well.  Pain or a burning feeling while urinating.  Pain during sex.  How is this diagnosed? This condition is diagnosed with a medical history and physical exam. This will include a pelvic exam. Your health care provider will examine a sample of your vaginal discharge under a microscope. Your health care provider may send this sample for testing to confirm the diagnosis. How is this treated? This condition is treated with medicine. Medicines may be over-the-counter or prescription. You may be told to use one or more of the following:  Medicine that is taken orally.  Medicine that is applied as a cream.  Medicine that is inserted directly into the vagina (suppository).  Follow these instructions at home:  Take or apply over-the-counter and prescription medicines only as told by your health care provider.  Do not have sex until your health care provider has approved. Tell your sex partner that you have a yeast infection. That person should go to his or her health care provider if he or she develops symptoms.  Do not wear tight clothes, such as pantyhose or tight pants.  Avoid using tampons until your health care provider approves.  Eat more yogurt. This may help to keep your yeast infection from  returning.  Try taking a sitz bath to help with discomfort. This is a warm water bath that is taken while you are sitting down. The water should only come up to your hips and should cover your buttocks. Do this 3-4 times per day or as told by your health care provider.  Do not douche.  Wear breathable, cotton underwear.  If you have diabetes, keep your blood sugar levels under control. Contact a health care provider if:  You have a fever.  Your symptoms go away and then return.  Your symptoms do not get better with treatment.  Your symptoms get worse.  You have new symptoms.  You develop blisters in or around your vagina.  You have blood coming from your vagina and it is not your menstrual period.  You develop pain in your abdomen. This information is not intended to replace advice given to you by your health care provider. Make sure you discuss any questions you have with your health  care provider. Document Released: 10/22/2004 Document Revised: 06/26/2015 Document Reviewed: 07/16/2014 Elsevier Interactive Patient Education  2018 [employer].

## 2017-10-08 NOTE — Progress Notes (Signed)
37 y.o. G0P0000 Married  Caucasian Fe here for annual exam. Last year was planning for pregnancy and saw infertility and 3 IVF attempts, no pregnancy. Her wife is planning to try for IVF in 2020 ( she had failed several IUI attempts. Continues to foster 81 months old who has been with them since infancy.. Periods regular since off hormones now and feels so much better.Cyndie Mull MD, as needed, was put on HCTZ for hypertension and also  Manages her Symbicort. Currently on Augmentin for URI, improving and feels  she has yeast infections since taking. Has taken one DIflucan she had, but symptoms are persisting. Please check. No other health issues today. Planning beach trip after visit today!  Patient's last menstrual period was 09/16/2017 (exact date).          Sexually active: Yes.    The current method of family planning is none. Female partner Exercising: Yes.    walking Smoker:  no  Review of Systems  Constitutional: Negative.   HENT:       Colds, sinusitis  Eyes: Negative.   Respiratory: Negative.   Cardiovascular: Negative.   Gastrointestinal: Negative.   Genitourinary: Negative.   Musculoskeletal: Negative.   Skin:       Vaginal itching  Neurological: Negative.   Endo/Heme/Allergies: Negative.   Psychiatric/Behavioral: Negative.     Health Maintenance: Pap:  05-24-15 neg HPV HR neg History of Abnormal Pap: no MMG:  none Self Breast exams: yes Colonoscopy:  none BMD:   none TDaP:  2015 Shingles: no Pneumonia: no Hep C and HIV: HIV neg 2017 Labs: if needed   reports that she has never smoked. She has never used smokeless tobacco. She reports that she drinks about 2.0 - 3.0 standard drinks of alcohol per week. She reports that she does not use drugs.  Past Medical History:  Diagnosis Date  . Allergy   . Anxiety   . Asthma   . Depression   . Infertility, female 02/15/2017   IVF treatment  . Migraines   . Trigeminal neuralgia     Past Surgical History:   Procedure Laterality Date  . WISDOM TOOTH EXTRACTION      Current Outpatient Medications  Medication Sig Dispense Refill  . albuterol (PROVENTIL HFA;VENTOLIN HFA) 108 (90 Base) MCG/ACT inhaler Inhale 2 puffs into the lungs every 4 (four) hours as needed for wheezing (cough, shortness of breath or wheezing.). 1 Inhaler 1  . budesonide-formoterol (SYMBICORT) 80-4.5 MCG/ACT inhaler Inhale 2 puffs into the lungs 2 (two) times daily.     Marland Kitchen escitalopram (LEXAPRO) 20 MG tablet Take 1 tablet (20 mg total) by mouth daily. 90 tablet 3  . hydrochlorothiazide (HYDRODIURIL) 25 MG tablet Take 1 tablet (25 mg total) by mouth daily. 90 tablet 3  . loratadine (CLARITIN) 10 MG tablet Take 1 tablet by mouth daily.    . pantoprazole (PROTONIX) 20 MG tablet Take 1 tablet (20 mg total) by mouth daily. 90 tablet 3   No current facility-administered medications for this visit.     Family History  Problem Relation Age of Onset  . Osteoporosis Mother   . Migraines Mother   . Migraines Sister   . Dementia Maternal Grandmother   . Diabetes Maternal Grandfather   . COPD Paternal Grandmother   . Cancer Neg Hx   . Heart disease Neg Hx   . Hypertension Neg Hx     ROS:  Pertinent items are noted in HPI.  Otherwise, a comprehensive ROS  was negative.  Exam:   BP 118/68   Pulse 68   Resp 16   Ht 5' 4.25" (1.632 m)   Wt 215 lb (97.5 kg)   LMP 09/16/2017 (Exact Date)   BMI 36.62 kg/m  Height: 5' 4.25" (163.2 cm) Ht Readings from Last 3 Encounters:  10/08/17 5' 4.25" (1.632 m)  09/22/17 5' 4.5" (1.638 m)  08/17/17 5' 4.5" (1.638 m)    General appearance: alert, cooperative and appears stated age Head: Normocephalic, without obvious abnormality, atraumatic Neck: no adenopathy, supple, symmetrical, trachea midline and thyroid normal to inspection and palpation Lungs: clear to auscultation bilaterally Breasts: normal appearance, no masses or tenderness, No nipple retraction or dimpling, No nipple discharge  or bleeding, No axillary or supraclavicular adenopathy Heart: regular rate and rhythm Abdomen: soft, non-tender; no masses,  no organomegaly Extremities: extremities normal, atraumatic, no cyanosis or edema Skin: Skin color, texture, turgor normal. No rashes or lesions Lymph nodes: Cervical, supraclavicular, and axillary nodes normal. No abnormal inguinal nodes palpated Neurologic: Grossly normal   Pelvic: External genitalia:  no lesions, normal female, some scaling and exudate noted, wet prep taken              Urethra:  normal appearing urethra with no masses, tenderness or lesions              Bartholin's and Skene's: normal                 Vagina: Normal appearing vagina with normal color and white thick slightly odorous discharge, no lesions. Wet prep taken              Cervix: no cervical motion tenderness, no lesions and nulliparous appearance              Pap taken: No. Bimanual Exam:  Uterus:  normal size, contour, position, consistency, mobility, non-tender and anteverted              Adnexa: normal adnexa and no mass, fullness, tenderness               Rectovaginal: Confirms               Anus:  normal sphincter tone, no lesions  Wet Prep: Koh,Saline positive for yeast only Ph 4.0  Chaperone present: yes  A:  Well Woman with normal exam  Contraception none needed, female partner  History of unsuccessful IVF x 3  URI being treated with Augmentin  Yeast vaginitis/vulvitis  Hypertension/anxiety/asthma management with PCP  P:   Reviewed health and wellness pertinent to exam  Discussed importance of completion of Augmentin and follow up if needed.  Discussed vaginal findings and need for treatment.  Rx Diflucan see order with instructions  Rx Mycolog ointment see order with instructions  Continue follow up with MD as indicated  Pap smear: no   counseled on breast self exam, mammography screening, feminine hygiene, adequate intake of calcium and vitamin D, diet and  exercise  return annually or prn  An After Visit Summary was printed and given to the patient.

## 2017-10-18 ENCOUNTER — Ambulatory Visit (INDEPENDENT_AMBULATORY_CARE_PROVIDER_SITE_OTHER): Payer: 59 | Admitting: Psychology

## 2017-10-18 DIAGNOSIS — F411 Generalized anxiety disorder: Secondary | ICD-10-CM | POA: Diagnosis not present

## 2017-10-27 ENCOUNTER — Ambulatory Visit (INDEPENDENT_AMBULATORY_CARE_PROVIDER_SITE_OTHER): Payer: 59 | Admitting: Psychology

## 2017-10-27 DIAGNOSIS — F411 Generalized anxiety disorder: Secondary | ICD-10-CM | POA: Diagnosis not present

## 2017-11-03 ENCOUNTER — Ambulatory Visit (INDEPENDENT_AMBULATORY_CARE_PROVIDER_SITE_OTHER): Payer: 59 | Admitting: Psychology

## 2017-11-03 DIAGNOSIS — F411 Generalized anxiety disorder: Secondary | ICD-10-CM

## 2017-11-09 MED FILL — PANTOPRAZOLE SOD DR 20 MG T: 20 | 90 days supply | Qty: 90 | Fill #2

## 2017-11-10 ENCOUNTER — Other Ambulatory Visit: Payer: Self-pay | Admitting: Emergency Medicine

## 2017-11-11 ENCOUNTER — Ambulatory Visit (INDEPENDENT_AMBULATORY_CARE_PROVIDER_SITE_OTHER): Payer: 59

## 2017-11-11 ENCOUNTER — Ambulatory Visit (INDEPENDENT_AMBULATORY_CARE_PROVIDER_SITE_OTHER): Payer: 59 | Admitting: Family Medicine

## 2017-11-11 ENCOUNTER — Ambulatory Visit (INDEPENDENT_AMBULATORY_CARE_PROVIDER_SITE_OTHER): Payer: 59 | Admitting: Psychology

## 2017-11-11 ENCOUNTER — Encounter: Payer: Self-pay | Admitting: Family Medicine

## 2017-11-11 ENCOUNTER — Other Ambulatory Visit: Payer: Self-pay

## 2017-11-11 VITALS — BP 148/80 | HR 86 | Temp 98.2°F | Ht 64.0 in | Wt 216.4 lb

## 2017-11-11 DIAGNOSIS — F411 Generalized anxiety disorder: Secondary | ICD-10-CM | POA: Diagnosis not present

## 2017-11-11 DIAGNOSIS — R102 Pelvic and perineal pain: Secondary | ICD-10-CM

## 2017-11-11 LAB — POCT URINALYSIS DIPSTICK
BILIRUBIN UA: NEGATIVE
Glucose, UA: NEGATIVE
Ketones, UA: NEGATIVE
LEUKOCYTES UA: NEGATIVE
Nitrite, UA: NEGATIVE
PH UA: 6 (ref 5.0–8.0)
Protein, UA: NEGATIVE
Spec Grav, UA: 1.015 (ref 1.010–1.025)
Urobilinogen, UA: 0.2 E.U./dL

## 2017-11-11 LAB — CBC WITH DIFFERENTIAL/PLATELET
Basophils Absolute: 0.1 10*3/uL (ref 0.0–0.1)
Basophils Relative: 0.7 % (ref 0.0–3.0)
EOS ABS: 0.3 10*3/uL (ref 0.0–0.7)
Eosinophils Relative: 3.4 % (ref 0.0–5.0)
HCT: 39 % (ref 36.0–46.0)
HEMOGLOBIN: 13.3 g/dL (ref 12.0–15.0)
Lymphocytes Relative: 13.5 % (ref 12.0–46.0)
Lymphs Abs: 1 10*3/uL (ref 0.7–4.0)
MCHC: 34.2 g/dL (ref 30.0–36.0)
MCV: 90.4 fl (ref 78.0–100.0)
MONO ABS: 0.5 10*3/uL (ref 0.1–1.0)
Monocytes Relative: 7.1 % (ref 3.0–12.0)
Neutro Abs: 5.8 10*3/uL (ref 1.4–7.7)
Neutrophils Relative %: 75.3 % (ref 43.0–77.0)
Platelets: 270 10*3/uL (ref 150.0–400.0)
RBC: 4.31 Mil/uL (ref 3.87–5.11)
RDW: 13.8 % (ref 11.5–15.5)
WBC: 7.7 10*3/uL (ref 4.0–10.5)

## 2017-11-11 MED ORDER — HYDROCHLOROTHIAZIDE 25 MG PO TABS: 25.0000 mg | ORAL_TABLET | Freq: Every day | ORAL | 3 refills | Status: DC

## 2017-11-11 MED ORDER — TRAMADOL HCL 50 MG PO TABS: 50.0000 mg | ORAL_TABLET | Freq: Four times a day (QID) | ORAL | 0 refills | Status: DC | PRN

## 2017-11-11 MED FILL — traMADol HCL 50 MG TABS: 50 | 5 days supply | Qty: 20 | Fill #0

## 2017-11-11 MED FILL — HYDROCHLOROTHIAZIDE 25 MG T: 25 | 90 days supply | Qty: 90 | Fill #0

## 2017-11-11 NOTE — Progress Notes (Signed)
Subjective  CC:  Chief Complaint  Patient presents with  . Abdominal Pain    lower abdominal pain on both sides, she states that she had this pain previously around time of period  . Fever    she states she had a 101 fever this morning, but Tylenol     HPI: Monica Benjamin is a 37 y.o. female who presents to the office today to address the problems listed above in the chief complaint.  Monica Benjamin presents with bilateral pelvic pain that started mildly last evening.  Now pain is moderate.  Also awoke with a fever of 101 this morning.  Responsive to Tylenol.  Denies any other associated symptoms including nausea vomiting diarrhea abdominal cramping cough sore throat chills, vaginal discharge, risk of STDs.  LMP was this week.  She finished bleeding yesterday.  She had IVF hormonal treatment but last was in July.  She had no problems with ovarian hyperstimulation.  No history of ovarian cyst.  She cannot take NSAIDs because she is allergic to them.  Outside of the pain which is constant and dull, she feels well. Assessment  1. Pelvic pain      Plan   Pelvic pain: Unclear etiology but clinical exam points to ovarian or GYN pathology.  Check blood work, urinalysis, treat with Tylenol and tramadol and monitor temperature curve.  Pelvic ultrasound today for further clarification.  Follow up: As needed  Orders Placed This Encounter  Procedures  . US Pelvic Complete With Transvaginal  . CBC with Differential/Platelet  . POCT urinalysis dipstick   Meds ordered this encounter  Medications  . DISCONTD: hydrochlorothiazide (HYDRODIURIL) 25 MG tablet    Sig: Take 1 tablet (25 mg total) by mouth daily.    Dispense:  90 tablet    Refill:  3  . hydrochlorothiazide (HYDRODIURIL) 25 MG tablet    Sig: Take 1 tablet (25 mg total) by mouth daily.    Dispense:  90 tablet    Refill:  3  . traMADol (ULTRAM) 50 MG tablet    Sig: Take 1 tablet (50 mg total) by mouth every 6 (six) hours as needed  for moderate pain.    Dispense:  20 tablet    Refill:  0      I reviewed the patients updated PMH, FH, and SocHx.    Patient Active Problem List   Diagnosis Date Noted  . OSA on CPAP 05/25/2016    Priority: High  . Moderate persistent asthma without complication 97/02/6376    Priority: High  . Chronic seasonal allergic rhinitis due to pollen 11/25/2015    Priority: Medium  . GAD (generalized anxiety disorder) 11/25/2015    Priority: Medium  . Gastroesophageal reflux disease without esophagitis 11/25/2015    Priority: Medium  . Irregular menstrual cycle 11/25/2015    Priority: Medium  . Irritable bowel syndrome with both constipation and diarrhea 11/25/2015    Priority: Medium  . Infertility, female 02/15/2017   Current Meds  Medication Sig  . albuterol (PROVENTIL HFA;VENTOLIN HFA) 108 (90 Base) MCG/ACT inhaler Inhale 2 puffs into the lungs every 4 (four) hours as needed for wheezing (cough, shortness of breath or wheezing.).  Marland Kitchen budesonide-formoterol (SYMBICORT) 80-4.5 MCG/ACT inhaler Inhale 2 puffs into the lungs 2 (two) times daily.   Marland Kitchen escitalopram (LEXAPRO) 20 MG tablet Take 1 tablet (20 mg total) by mouth daily.  . hydrochlorothiazide (HYDRODIURIL) 25 MG tablet Take 1 tablet (25 mg total) by mouth daily.  Marland Kitchen loratadine (CLARITIN)  10 MG tablet Take 1 tablet by mouth daily.  . pantoprazole (PROTONIX) 20 MG tablet Take 1 tablet (20 mg total) by mouth daily.  . [DISCONTINUED] hydrochlorothiazide (HYDRODIURIL) 25 MG tablet Take 1 tablet (25 mg total) by mouth daily.  . [DISCONTINUED] hydrochlorothiazide (HYDRODIURIL) 25 MG tablet Take 1 tablet (25 mg total) by mouth daily.    Allergies: Patient is allergic to nsaids. Family History: Patient family history includes COPD in her paternal grandmother; Dementia in her maternal grandmother; Diabetes in her maternal grandfather; Migraines in her mother and sister; Osteoporosis in her mother. Social History:  Patient  reports that  she has never smoked. She has never used smokeless tobacco. She reports that she drinks about 2.0 - 3.0 standard drinks of alcohol per week. She reports that she does not use drugs.  Review of Systems: Constitutional: Negative for fever malaise or anorexia Cardiovascular: negative for chest pain Respiratory: negative for SOB or persistent cough Gastrointestinal: negative for abdominal pain  Objective  Vitals: BP (!) 148/80   Pulse 86   Temp 98.2 F (36.8 C)   Ht 5\' 4"  (1.626 m)   Wt 216 lb 6.4 oz (98.2 kg)   BMI 37.14 kg/m  General: no acute distress , A&Ox3, appears well HEENT: PEERL, conjunctiva normal, Oropharynx moist,neck is supple Cardiovascular:  RRR without murmur or gallop.  Respiratory:  Good breath sounds bilaterally, CTAB with normal respiratory effort Gastrointestinal: soft, flat abdomen, normal active bowel sounds, no palpable masses, no hepatosplenomegaly, no appreciated hernias GYN: Normal introitus, normal vaginal mucosa without discharge, old blood at the office, nulliparous cervix, no CMT, minimal fundal tenderness, bilateral adnexal tenderness without masses present, no rebound or guarding. Skin:  Warm, no rashes  Office Visit on 11/11/2017  Component Date Value Ref Range Status  . Color, UA 11/11/2017 Yellow   Final  . Clarity, UA 11/11/2017 Clear   Final  . Glucose, UA 11/11/2017 Negative  Negative Final  . Bilirubin, UA 11/11/2017 Negative   Final  . Ketones, UA 11/11/2017 Negative   Final  . Spec Grav, UA 11/11/2017 1.015  1.010 - 1.025 Final  . Blood, UA 11/11/2017 1+   Final  . pH, UA 11/11/2017 6.0  5.0 - 8.0 Final  . Protein, UA 11/11/2017 Negative  Negative Final  . Urobilinogen, UA 11/11/2017 0.2  0.2 or 1.0 E.U./dL Final  . Nitrite, UA 11/11/2017 Negative   Final  . Leukocytes, UA 11/11/2017 Negative  Negative Final      Commons side effects, risks, benefits, and alternatives for medications and treatment plan prescribed today were discussed,  and the patient expressed understanding of the given instructions. Patient is instructed to call or message via MyChart if he/she has any questions or concerns regarding our treatment plan. No barriers to understanding were identified. We discussed Red Flag symptoms and signs in detail. Patient expressed understanding regarding what to do in case of urgent or emergency type symptoms.   Medication list was reconciled, printed and provided to the patient in AVS. Patient instructions and summary information was reviewed with the patient as documented in the AVS. This note was prepared with assistance of Dragon voice recognition software. Occasional wrong-word or sound-a-like substitutions may have occurred due to the inherent limitations of voice recognition software

## 2017-11-11 NOTE — Patient Instructions (Addendum)
Please follow up if symptoms do not improve or as needed.  I have ordered an ultrasound.  Use tylenol and/or tramadol as needed for pain.  Monitor your temperature curve.   I'll let you know what your urine and blood test shows.   If anything worsens, let me know.    Pelvic Pain, Female Pelvic pain is pain in your lower abdomen, below your belly button and between your hips. The pain may start suddenly (acute), keep coming back (recurring), or last a long time (chronic). Pelvic pain that lasts longer than six months is considered chronic. Pelvic pain may affect your:  Reproductive organs.  Urinary system.  Digestive tract.  Musculoskeletal system.  There are many potential causes of pelvic pain. Sometimes, the pain can be a result of digestive or urinary conditions, strained muscles or ligaments, or even reproductive conditions. Sometimes the cause of pelvic pain is not known. Follow these instructions at home:  Take over-the-counter and prescription medicines only as told by your health care provider.  Rest as told by your health care provider.  Do not have sex it if hurts.  Keep a journal of your pelvic pain. Write down: ? When the pain started. ? Where the pain is located. ? What seems to make the pain better or worse, such as food or your menstrual cycle. ? Any symptoms you have along with the pain.  Keep all follow-up visits as told by your health care provider. This is important. Contact a health care provider if:  Medicine does not help your pain.  Your pain comes back.  You have new symptoms.  You have abnormal vaginal discharge or bleeding, including bleeding after menopause.  You have a fever or chills.  You are constipated.  You have blood in your urine or stool.  You have foul-smelling urine.  You feel weak or lightheaded. Get help right away if:  You have sudden severe pain.  Your pain gets steadily worse.  You have severe pain along with  fever, nausea, vomiting, or excessive sweating.  You lose consciousness. This information is not intended to replace advice given to you by your health care provider. Make sure you discuss any questions you have with your health care provider. Document Released: 12/10/2003 Document Revised: 02/06/2015 Document Reviewed: 11/02/2014 Elsevier Interactive Patient Education  2018 [employer].

## 2017-12-01 ENCOUNTER — Ambulatory Visit (INDEPENDENT_AMBULATORY_CARE_PROVIDER_SITE_OTHER): Payer: 59 | Admitting: Psychology

## 2017-12-01 DIAGNOSIS — F411 Generalized anxiety disorder: Secondary | ICD-10-CM

## 2017-12-14 ENCOUNTER — Other Ambulatory Visit: Payer: Self-pay | Admitting: Family Medicine

## 2017-12-14 MED FILL — ESCITALOPRAM 10 MG TABLET: 10 | 90 days supply | Qty: 180 | Fill #0

## 2017-12-15 ENCOUNTER — Ambulatory Visit: Payer: Self-pay | Admitting: Family Medicine

## 2017-12-22 ENCOUNTER — Ambulatory Visit (INDEPENDENT_AMBULATORY_CARE_PROVIDER_SITE_OTHER): Payer: 59 | Admitting: Psychology

## 2017-12-22 DIAGNOSIS — F411 Generalized anxiety disorder: Secondary | ICD-10-CM | POA: Diagnosis not present

## 2018-01-04 ENCOUNTER — Ambulatory Visit (INDEPENDENT_AMBULATORY_CARE_PROVIDER_SITE_OTHER): Payer: 59 | Admitting: Psychology

## 2018-01-04 DIAGNOSIS — F411 Generalized anxiety disorder: Secondary | ICD-10-CM | POA: Diagnosis not present

## 2018-01-25 ENCOUNTER — Ambulatory Visit (INDEPENDENT_AMBULATORY_CARE_PROVIDER_SITE_OTHER): Payer: 59 | Admitting: Psychology

## 2018-01-25 DIAGNOSIS — F411 Generalized anxiety disorder: Secondary | ICD-10-CM | POA: Diagnosis not present

## 2018-02-07 MED FILL — PANTOPRAZOLE SOD DR 20 MG T: 20 | 90 days supply | Qty: 90 | Fill #3

## 2018-02-14 ENCOUNTER — Ambulatory Visit: Payer: 59 | Admitting: Psychology

## 2018-02-25 ENCOUNTER — Ambulatory Visit (INDEPENDENT_AMBULATORY_CARE_PROVIDER_SITE_OTHER): Payer: 59 | Admitting: Psychology

## 2018-02-25 DIAGNOSIS — F411 Generalized anxiety disorder: Secondary | ICD-10-CM | POA: Diagnosis not present

## 2018-03-09 ENCOUNTER — Encounter: Payer: Self-pay | Admitting: Family Medicine

## 2018-03-09 ENCOUNTER — Ambulatory Visit (INDEPENDENT_AMBULATORY_CARE_PROVIDER_SITE_OTHER): Payer: 59 | Admitting: Psychology

## 2018-03-09 DIAGNOSIS — F411 Generalized anxiety disorder: Secondary | ICD-10-CM | POA: Diagnosis not present

## 2018-03-16 ENCOUNTER — Encounter: Payer: Self-pay | Admitting: Family Medicine

## 2018-03-16 ENCOUNTER — Other Ambulatory Visit: Payer: Self-pay

## 2018-03-16 ENCOUNTER — Ambulatory Visit (INDEPENDENT_AMBULATORY_CARE_PROVIDER_SITE_OTHER): Payer: 59 | Admitting: Family Medicine

## 2018-03-16 VITALS — BP 128/86 | HR 81 | Temp 98.5°F | Resp 16 | Ht 64.0 in | Wt 204.8 lb

## 2018-03-16 DIAGNOSIS — I10 Essential (primary) hypertension: Secondary | ICD-10-CM | POA: Insufficient documentation

## 2018-03-16 DIAGNOSIS — F411 Generalized anxiety disorder: Secondary | ICD-10-CM | POA: Diagnosis not present

## 2018-03-16 DIAGNOSIS — J4540 Moderate persistent asthma, uncomplicated: Secondary | ICD-10-CM | POA: Diagnosis not present

## 2018-03-16 DIAGNOSIS — Z0000 Encounter for general adult medical examination without abnormal findings: Secondary | ICD-10-CM | POA: Diagnosis not present

## 2018-03-16 LAB — LIPID PANEL
Cholesterol: 214 mg/dL — ABNORMAL HIGH (ref 0–200)
HDL: 47.5 mg/dL (ref >39.00)
NonHDL: 166.71
Total CHOL/HDL Ratio: 5
Triglycerides: 314 mg/dL — ABNORMAL HIGH (ref 0.0–149.0)
VLDL: 62.8 mg/dL — ABNORMAL HIGH (ref 0.0–40.0)

## 2018-03-16 LAB — CBC WITH DIFFERENTIAL/PLATELET
BASOS ABS: 0.1 10*3/uL (ref 0.0–0.1)
Basophils Relative: 0.9 % (ref 0.0–3.0)
Eosinophils Absolute: 0.3 10*3/uL (ref 0.0–0.7)
Eosinophils Relative: 3.2 % (ref 0.0–5.0)
HCT: 43.1 % (ref 36.0–46.0)
Hemoglobin: 14.9 g/dL (ref 12.0–15.0)
Lymphocytes Relative: 33.3 % (ref 12.0–46.0)
Lymphs Abs: 3.5 10*3/uL (ref 0.7–4.0)
MCHC: 34.6 g/dL (ref 30.0–36.0)
MCV: 89.2 fl (ref 78.0–100.0)
MONOS PCT: 5.5 % (ref 3.0–12.0)
Monocytes Absolute: 0.6 10*3/uL (ref 0.1–1.0)
Neutro Abs: 6 10*3/uL (ref 1.4–7.7)
Neutrophils Relative %: 57.1 % (ref 43.0–77.0)
Platelets: 309 10*3/uL (ref 150.0–400.0)
RBC: 4.83 Mil/uL (ref 3.87–5.11)
RDW: 12.9 % (ref 11.5–15.5)
WBC: 10.5 10*3/uL (ref 4.0–10.5)

## 2018-03-16 LAB — COMPREHENSIVE METABOLIC PANEL
ALT: 16 U/L (ref 0–35)
AST: 12 U/L (ref 0–37)
Albumin: 4.5 g/dL (ref 3.5–5.2)
Alkaline Phosphatase: 96 U/L (ref 39–117)
BUN: 14 mg/dL (ref 6–23)
CO2: 32 mEq/L (ref 19–32)
Calcium: 10.1 mg/dL (ref 8.4–10.5)
Chloride: 97 mEq/L (ref 96–112)
Creatinine, Ser: 0.82 mg/dL (ref 0.40–1.20)
GFR: 77.43 mL/min (ref >60.00)
Glucose, Bld: 105 mg/dL — ABNORMAL HIGH (ref 70–99)
Potassium: 3.5 mEq/L (ref 3.5–5.1)
Sodium: 137 mEq/L (ref 135–145)
Total Bilirubin: 0.6 mg/dL (ref 0.2–1.2)
Total Protein: 7.7 g/dL (ref 6.0–8.3)

## 2018-03-16 LAB — TSH: TSH: 1.25 u[IU]/mL (ref 0.35–4.50)

## 2018-03-16 LAB — LDL CHOLESTEROL, DIRECT: Direct LDL: 137 mg/dL

## 2018-03-16 MED ORDER — VORTIOXETINE HBR 10 MG PO TABS: 10.0000 mg | ORAL_TABLET | Freq: Every day | ORAL | 11 refills | Status: DC

## 2018-03-16 MED ORDER — ESCITALOPRAM OXALATE 10 MG PO TABS: ORAL_TABLET | ORAL | 0 refills | Status: DC

## 2018-03-16 MED ORDER — DROSPIRENONE-ETHINYL ESTRADIOL 3-0.02 MG PO TABS: 1.0000 | ORAL_TABLET | Freq: Every day | ORAL | 11 refills | Status: DC

## 2018-03-16 MED FILL — ESCITALOPRAM 10 MG TABLET: 10 | 13 days supply | Qty: 10 | Fill #0

## 2018-03-16 MED FILL — DROSPIR-ETH ESTRA 3/.02 MG: 3-0.02 | 28 days supply | Qty: 28 | Fill #0

## 2018-03-16 MED FILL — TRINTELLIX 10 MG TABLET: 10 | 30 days supply | Qty: 30 | Fill #0 | Status: TO

## 2018-03-16 NOTE — Progress Notes (Signed)
Subjective  Chief Complaint  Patient presents with  . Annual Exam    Fasting  . Hypertension  . Anxiety    With the increase in Lexapro she is up and down with her mood    HPI: Monica Benjamin is a 38 y.o. female who presents to [facility] today for a Female Wellness Visit.  She also has the concerns and/or needs as listed above in the chief complaint. These will be addressed in addition to the Health Maintenance Visit.   Wellness Visit: annual visit with health maintenance review and exam without Pap   38 year old here for complete physical.  She is fasting today for lab work.  She is doing weight watchers and is doing well.  She feels better when she is at a lower weight.  However, life is hectic: Busy at work, busy coaching basketball, busy home life with young daughter.  The foster son is no longer with them.  They kept him for about 6 weeks.  Chronic disease management visit and/or acute problem visit:  Mood follow up: Feeling more down.  Feeling more emotional.  More irritability noticed around menstrual cycles.  Has history of PMS symptoms that have now worsened.  On high-dose Lexapro for the last 6 months, initially helped.  Has been on Lexapro for about 3 to 4 years with good results until now.  Having some marital problems.  Also with low libido and menstrual related migraines worsening.  She tolerates birth control.  No suicidal ideation.  She is seeing a counselor regularly.  Hypertension VZ:CHYIFOY well. Taking medications w/o adverse effects. No symptoms of CHF, angina; no palpitations, sob, cp or lower extremity edema. Compliant with meds.  Does not check blood pressure outside of the office.  Compliant with her medications.  Asthma well controlled by report. BP Readings from Last 3 Encounters:  03/16/18 128/86  11/11/17 (!) 148/80  10/08/17 118/68    Assessment  1. Annual physical exam   2. Essential hypertension   3. Moderate  persistent asthma without complication   4. GAD (generalized anxiety disorder)      Plan  Female Wellness Visit:  Age appropriate Health Maintenance and Prevention measures were discussed with patient. Included topics are cancer screening recommendations, ways to keep healthy (see AVS) including dietary and exercise recommendations, regular eye and dental care, use of seat belts, and avoidance of moderate alcohol use and tobacco use.   BMI: discussed patient's BMI and encouraged positive lifestyle modifications to help get to or maintain a target BMI.  Continue weight watchers  HM needs and immunizations were addressed and ordered. See below for orders. See HM and immunization section for updates.  Routine labs and screening tests ordered including cmp, cbc and lipids where appropriate.  Discussed recommendations regarding Vit D and calcium supplementation (see AVS)  Chronic disease f/u and/or acute problem visit: (deemed necessary to be done in addition to the wellness visit):  Chronic depression/anxiety, worsening: Multifactorial.  Wean Lexapro and change to Trintellix.  Education given.  Also will start oral contraceptives to help with PMS type symptoms and hopefully improve libido.  Continue counseling, consider marital counseling.  Recheck 8 weeks.  Check thyroid  Hypertension with fairly good control.  Continue to monitor.  Check some outside readings.  Recheck in 8 weeks.  Adjust medications at that time if needed.  Recheck electrolytes and lipids today.  Follow up: Return in about 8 weeks (around 05/11/2018) for mood follow up.  Orders Placed This Encounter  Procedures  . CBC with Differential/Platelet  . Comprehensive metabolic panel  . Lipid panel  . TSH   Meds ordered this encounter  Medications  . vortioxetine HBr (TRINTELLIX) 10 MG TABS tablet    Sig: Take 1 tablet (10 mg total) by mouth daily.    Dispense:  30 tablet    Refill:  11  . escitalopram (LEXAPRO) 10 MG  tablet    Sig: Take 1 tablet (10 mg total) by mouth daily for 7 days, THEN 1 tablet (10 mg total) every other day for 7 days.    Dispense:  10 tablet    Refill:  0  . drospirenone-ethinyl estradiol (YAZ) 3-0.02 MG tablet    Sig: Take 1 tablet by mouth daily.    Dispense:  1 Package    Refill:  11      Lifestyle: Body mass index is 35.15 kg/m. Wt Readings from Last 3 Encounters:  03/16/18 204 lb 12.8 oz (92.9 kg)  11/11/17 216 lb 6.4 oz (98.2 kg)  10/08/17 215 lb (97.5 kg)     Patient Active Problem List   Diagnosis Date Noted  . Essential hypertension 03/16/2018    Priority: High  . OSA on CPAP 05/25/2016    Priority: High    Overview:  Dr. Michela Pitcher   . Moderate persistent asthma without complication 22/29/7989    Priority: High    Overview:  Never hospitalized; dxd 2015; Dr. Michela Pitcher 2017   . Chronic seasonal allergic rhinitis due to pollen 11/25/2015    Priority: Medium  . GAD (generalized anxiety disorder) 11/25/2015    Priority: Medium    Overview:  Controlled with lexapro   . Gastroesophageal reflux disease without esophagitis 11/25/2015    Priority: Medium  . Irritable bowel syndrome with both constipation and diarrhea 11/25/2015    Priority: Medium    Overview:  Negative work up with Dr. Collene Mares   . Infertility, female 02/15/2017    IVF treatment    Health Maintenance  Topic Date Due  . INFLUENZA VACCINE  05/12/2018 (Originally 08/26/2017)  . PAP SMEAR-Modifier  05/26/2020  . TETANUS/TDAP  11/01/2023  . HIV Screening  Completed   Immunization History  Administered Date(s) Administered  . HPV Quadrivalent 10/09/2004, 12/17/2004, 04/29/2005  . Influenza, Seasonal, Injecte, Preservative Fre 10/28/2015  . Influenza-Unspecified 10/07/2016  . Tdap 10/12/2006, 10/31/2013   We updated and reviewed the patient's past history in detail and it is documented below. Allergies: Patient  reports current alcohol use of about 2.0 - 3.0 standard drinks of alcohol per  week. Past Medical History Patient  has a past medical history of Allergy, Anxiety, Asthma, Depression, Hypertension, Infertility, female (02/15/2017), Migraines, and Trigeminal neuralgia. Past Surgical History Patient  has a past surgical history that includes Wisdom tooth extraction. Social History   Socioeconomic History  . Marital status: Married    Spouse name: Museum/gallery conservator  . Number of children: Not on file  . Years of education: Not on file  . Highest education level: Not on file  Occupational History  . Occupation: Company secretary: [employer]  . Financial resource strain: Not on file  . Food insecurity:    Worry: Not on file    Inability: Not on file  . Transportation needs:    Medical: Not on file    Non-medical: Not on file  Tobacco Use  . Smoking status: Never Smoker  . Smokeless tobacco: Never Used  Substance  and Sexual Activity  . Alcohol use: Yes    Alcohol/week: 2.0 - 3.0 standard drinks    Types: 2 - 3 Standard drinks or equivalent per week  . Drug use: No  . Sexual activity: Yes    Partners: Female    Birth control/protection: None  Lifestyle  . Physical activity:    Days per week: Not on file    Minutes per session: Not on file  . Stress: Not on file  Relationships  . Social connections:    Talks on phone: Not on file    Gets together: Not on file    Attends religious service: Not on file    Active member of club or organization: Not on file    Attends meetings of clubs or organizations: Not on file    Relationship status: Not on file  Other Topics Concern  . Not on file  Social History Narrative   Married to [employer]   Family History  Problem Relation Age of Onset  . Osteoporosis Mother   . Migraines Mother   . Migraines Sister   . Dementia Maternal Grandmother   . Diabetes Maternal Grandfather   . COPD Paternal Grandmother   . Cancer Neg Hx   . Heart disease Neg Hx   . Hypertension Neg Hx     Review of  Systems: Constitutional: negative for fever or malaise Ophthalmic: negative for photophobia, double vision or loss of vision Cardiovascular: negative for chest pain, dyspnea on exertion, or new LE swelling Respiratory: negative for SOB or persistent cough Gastrointestinal: negative for abdominal pain, change in bowel habits or melena Genitourinary: negative for dysuria or gross hematuria, no abnormal uterine bleeding or disharge Musculoskeletal: negative for new gait disturbance or muscular weakness Integumentary: negative for new or persistent rashes, no breast lumps Neurological: negative for TIA or stroke symptoms Psychiatric: negative for SI or delusions Allergic/Immunologic: negative for hives  Patient Care Team    Relationship Specialty Notifications Start End  Leamon Arnt, MD PCP - General Family Medicine  07/22/16   Dionne Milo, MD Referring Physician Pulmonary Disease  02/15/17   Governor Specking, MD Consulting Physician Obstetrics and Gynecology  02/15/17     Objective  Vitals: BP 128/86   Pulse 81   Temp 98.5 F (36.9 C) (Oral)   Resp 16   Ht 5\' 4"  (1.626 m)   Wt 204 lb 12.8 oz (92.9 kg)   LMP 03/09/2018   SpO2 98%   BMI 35.15 kg/m  General:  Well developed, well nourished, no acute distress  Psych:  Alert and orientedx3, flat and sad mood and affect HEENT:  Normocephalic, atraumatic, non-icteric sclera, PERRL, oropharynx is clear without mass or exudate, supple neck without adenopathy, mass or thyromegaly Cardiovascular:  Normal S1, S2, RRR without gallop, rub or murmur, nondisplaced PMI Respiratory:  Good breath sounds bilaterally, CTAB with normal respiratory effort Gastrointestinal: normal bowel sounds, soft, non-tender, no noted masses. No HSM MSK: no deformities, contusions. Joints are without erythema or swelling. Spine and CVA region are nontender Skin:  Warm, no rashes or suspicious lesions noted Neurologic:    Mental status is normal. CN 2-11 are  normal. Gross motor and sensory exams are normal. Normal gait. No tremor     Commons side effects, risks, benefits, and alternatives for medications and treatment plan prescribed today were discussed, and the patient expressed understanding of the given instructions. Patient is instructed to call or message via MyChart if he/she has any questions or concerns  regarding our treatment plan. No barriers to understanding were identified. We discussed Red Flag symptoms and signs in detail. Patient expressed understanding regarding what to do in case of urgent or emergency type symptoms.   Medication list was reconciled, printed and provided to the patient in AVS. Patient instructions and summary information was reviewed with the patient as documented in the AVS. This note was prepared with assistance of Dragon voice recognition software. Occasional wrong-word or sound-a-like substitutions may have occurred due to the inherent limitations of voice recognition software

## 2018-03-16 NOTE — Patient Instructions (Signed)
Please return in 8 weeks to recheck mood.   Wean the lexapro as directed on bottle.  Start the trintellix once off the lexapro.  Call with any problems.  Start the birth control pill with the onset of your next menstrual cycle.   I will release your lab results to you on your MyChart account with further instructions. Please reply with any questions.    If you have any questions or concerns, please don't hesitate to send me a message via MyChart or call the office at [phone]. Thank you for visiting with [place] today! It's our pleasure caring for you.  Please do these things to maintain good health!   Exercise at least 30-45 minutes a day,  4-5 days a week.   Eat a low-fat diet with lots of fruits and vegetables, up to 7-9 servings per day.  Drink plenty of water daily. Try to drink 8 8oz glasses per day.  Seatbelts can save your life. Always wear your seatbelt.  Place Smoke Detectors on every level of your home and check batteries every year.  Schedule an appointment with an eye doctor for an eye exam every 1-2 years  Safe sex - use condoms to protect yourself from STDs if you could be exposed to these types of infections. Use birth control if you do not want to become pregnant and are sexually active.  Avoid heavy alcohol use. If you drink, keep it to less than 2 drinks/day and not every day.  [place].  Choose someone you trust that could speak for you if you became unable to speak for yourself.  Depression is common in our stressful world.If you're feeling down or losing interest in things you normally enjoy, please come in for a visit.  If anyone is threatening or hurting you, please get help. Physical or Emotional Violence is never OK.

## 2018-03-22 ENCOUNTER — Ambulatory Visit (INDEPENDENT_AMBULATORY_CARE_PROVIDER_SITE_OTHER): Payer: 59 | Admitting: Psychology

## 2018-03-22 DIAGNOSIS — F411 Generalized anxiety disorder: Secondary | ICD-10-CM

## 2018-03-23 ENCOUNTER — Ambulatory Visit: Payer: 59 | Admitting: Psychology

## 2018-04-06 ENCOUNTER — Ambulatory Visit (INDEPENDENT_AMBULATORY_CARE_PROVIDER_SITE_OTHER): Payer: 59 | Admitting: Psychology

## 2018-04-06 DIAGNOSIS — F411 Generalized anxiety disorder: Secondary | ICD-10-CM | POA: Diagnosis not present

## 2018-04-18 ENCOUNTER — Other Ambulatory Visit: Payer: Self-pay | Admitting: Family Medicine

## 2018-04-18 MED FILL — HYDROCHLOROTHIAZIDE 25 MG T: 25 | 90 days supply | Qty: 90 | Fill #0

## 2018-04-18 MED FILL — TRINTELLIX 10 MG TABLET: 10 | 30 days supply | Qty: 30 | Fill #0

## 2018-04-20 ENCOUNTER — Ambulatory Visit (INDEPENDENT_AMBULATORY_CARE_PROVIDER_SITE_OTHER): Payer: 59 | Admitting: Psychology

## 2018-04-20 DIAGNOSIS — F411 Generalized anxiety disorder: Secondary | ICD-10-CM | POA: Diagnosis not present

## 2018-04-20 MED FILL — PANTOPRAZOLE SOD DR 20 MG T: 20 | 90 days supply | Qty: 90 | Fill #0

## 2018-04-26 ENCOUNTER — Other Ambulatory Visit: Payer: Self-pay

## 2018-05-02 ENCOUNTER — Ambulatory Visit (INDEPENDENT_AMBULATORY_CARE_PROVIDER_SITE_OTHER): Payer: 59 | Admitting: Psychology

## 2018-05-02 DIAGNOSIS — F411 Generalized anxiety disorder: Secondary | ICD-10-CM

## 2018-05-10 ENCOUNTER — Ambulatory Visit (INDEPENDENT_AMBULATORY_CARE_PROVIDER_SITE_OTHER): Payer: 59 | Admitting: Family Medicine

## 2018-05-10 ENCOUNTER — Encounter: Payer: Self-pay | Admitting: Family Medicine

## 2018-05-10 ENCOUNTER — Ambulatory Visit: Payer: 59 | Admitting: Family Medicine

## 2018-05-10 ENCOUNTER — Other Ambulatory Visit: Payer: Self-pay

## 2018-05-10 VITALS — BP 122/88 | Temp 99.1°F

## 2018-05-10 DIAGNOSIS — I10 Essential (primary) hypertension: Secondary | ICD-10-CM

## 2018-05-10 DIAGNOSIS — E781 Pure hyperglyceridemia: Secondary | ICD-10-CM | POA: Diagnosis not present

## 2018-05-10 DIAGNOSIS — F330 Major depressive disorder, recurrent, mild: Secondary | ICD-10-CM

## 2018-05-10 DIAGNOSIS — F411 Generalized anxiety disorder: Secondary | ICD-10-CM

## 2018-05-10 MED ORDER — LISINOPRIL 5 MG PO TABS: 5.0000 mg | ORAL_TABLET | Freq: Every day | ORAL | 3 refills | Status: DC

## 2018-05-10 MED FILL — LISINOPRIL 5 MG TABLET: 5 | 90 days supply | Qty: 90 | Fill #0

## 2018-05-10 NOTE — Progress Notes (Signed)
I have discussed the procedure for the virtual visit with the patient who has given consent to proceed with assessment and treatment.   Aristeo Hankerson S Aristide Waggle, CMA     

## 2018-05-10 NOTE — Patient Instructions (Addendum)
Please return in 3 months for mood recheck.   If you have any questions or concerns, please don't hesitate to send me a message via MyChart or call the office at [phone]. Thank you for visiting with [place] today! It's our pleasure caring for you.

## 2018-05-10 NOTE — Progress Notes (Signed)
Virtual Visit via Video Note  SUBJECTIVE CC:  Chief Complaint  Patient presents with   Follow-up   Hypertension    BPs average 120s over 80s.   Depression    PHQ9 today is 10. She has been on Trintellix for about 2 weeks    I connected with [place] on 05/10/18 at  8:20 AM EDT by a video enabled telemedicine application and verified that I am speaking with the correct person using two identifiers. Location patient: Home Location provider: [employer], Office Persons participating in the virtual visit: [place], Leamon Arnt, MD Lilli Light, [place] discussed the limitations of evaluation and management by telemedicine and the availability of in person appointments. The patient expressed understanding and agreed to proceed.  HPI:   Virtual visit for f/u on Mood and HTN. Weaned off lexapro due to worsening depressive sxs and apathy. Started trintellix x 6 weeks and reports feeling back to herself. Less down. Less apathetic. 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 However feels like she is handling it well. No AEs from meds. Did not start BC: is having mild cycle mood swings but would rather hold off on [facility] for now and monitor sxs on trintellix  Depression screen [facility] 2/9 05/10/2018 03/16/2018 02/15/2017  Decreased Interest 1 1 0  Down, Depressed, Hopeless 1 1 0  PHQ - 2 Score 2 2 0  Altered sleeping 2 2 0  Tired, decreased energy 3 2 1   Change in appetite 0 1 1  Feeling bad or failure about yourself  0 1 0  Trouble concentrating 3 1 0  Moving slowly or fidgety/restless 0 1 0  Suicidal thoughts 0 0 0  PHQ-9 Score 10 10 2   Difficult doing work/chores Not difficult at all Somewhat difficult Not difficult at all   GAD 7 : Generalized Anxiety Score 03/16/2018 02/15/2017  Nervous, Anxious, on Edge 1  1  Control/stop worrying 1 1  Worry too much - different things 1 1  Trouble relaxing 1 1  Restless 1 0  Easily annoyed or irritable 2 1  Afraid - awful might happen 0 0  Total GAD 7 Score 7 5  Anxiety Difficulty Somewhat difficult Not difficult at all    Previously on prescription medications for mood/anxiety: lexapro for years; weaned last month Therapist/counseling:in past Previous Diagnosis of psychiatric disorder: GAD Family history of psychiatric disorder: n/a  Hypertension f/u: Control is good . Pt reports she is doing well. taking medications as instructed, no medication side effects noted, no TIAs, no chest pain on exertion, no dyspnea on exertion, no swelling of ankles. Home readings 1202-130s/80s-90.  She denies adverse effects from his BP medications. Compliance with medication is good.   Hypertrigs: started fish oil and tolerating fine. Working on [employer]. Exercise is limited currently.   BP Readings from Last 3 Encounters:  05/10/18 122/88  03/16/18 128/86  11/11/17 (!) 148/80   Wt Readings from Last 3 Encounters:  03/16/18 204 lb 12.8 oz (92.9 kg)  11/11/17 216 lb 6.4 oz (98.2 kg)  10/08/17 215 lb (97.5 kg)    Lab Results  Component Value Date   CHOL 214 (H) 03/16/2018   CHOL 223 (H) 08/17/2017   Lab Results  Component Value Date   HDL 47.50 03/16/2018   HDL 40.70 08/17/2017   No results found  for: [facility] Lab Results  Component Value Date   TRIG 314.0 (H) 03/16/2018   TRIG (H) 08/17/2017    429.0 Triglyceride is over 400; calculations on Lipids are invalid.   Lab Results  Component Value Date   CHOLHDL 5 03/16/2018   CHOLHDL 5 08/17/2017   Lab Results  Component Value Date   LDLDIRECT 137.0 03/16/2018   LDLDIRECT 117.0 08/17/2017   Lab Results  Component Value Date   CREATININE 0.82 03/16/2018   BUN 14 03/16/2018   NA 137 03/16/2018   K 3.5 03/16/2018   CL 97 03/16/2018   CO2 32 03/16/2018    The ASCVD Risk score (Goff DC Jr.,  et al., 2013) failed to calculate for the following reasons:   The 2013 ASCVD risk score is only valid for ages 57 to 24  ASSESSMENT 1. GAD (generalized anxiety disorder)   2. Essential hypertension   3. Mild episode of recurrent major depressive disorder ([place])   4. Hypertriglyceridemia      Depression/GAD: improving on trintellix. Continue. Recheck 3 months. Ok to hold off on [facility] for now. Will monitor cycle and mood changes.   HTN: fair control. Add low dose ace to treat elevated diastolic readings.   Hypertrigs: on fish oil. Will recheck fasting levels once able to bring back in office   I discussed the assessment and treatment plan with the patient. The patient was provided an opportunity to ask questions and all were answered. The patient agreed with the plan and demonstrated an understanding of the instructions.   The patient was advised to call back or seek an in-person evaluation if the symptoms worsen or if the condition fails to improve as anticipated. Follow up: Return in about 3 months (around 08/09/2018) for mood follow up, follow up Hypertension.  Visit date not found  Meds ordered this encounter  Medications   lisinopril (PRINIVIL,ZESTRIL) 5 MG tablet    Sig: Take 1 tablet (5 mg total) by mouth daily.    Dispense:  90 tablet    Refill:  3      I reviewed the patients updated PMH, FH, and SocHx.    Patient Active Problem List   Diagnosis Date Noted   Essential hypertension 03/16/2018    Priority: High   OSA on CPAP 05/25/2016    Priority: High   Moderate persistent asthma without complication 49/44/9675    Priority: High   Chronic seasonal allergic rhinitis due to pollen 11/25/2015    Priority: Medium   GAD (generalized anxiety disorder) 11/25/2015    Priority: Medium   Gastroesophageal reflux disease without esophagitis 11/25/2015    Priority: Medium   Irritable bowel syndrome with both constipation and diarrhea 11/25/2015    Priority: Medium    Mild episode of recurrent major depressive disorder ([place]) 05/10/2018   Hypertriglyceridemia 05/10/2018   Infertility, female 02/15/2017   Current Meds  Medication Sig   albuterol (PROVENTIL HFA;VENTOLIN HFA) 108 (90 Base) MCG/ACT inhaler Inhale 2 puffs into the lungs every 4 (four) hours as needed for wheezing (cough, shortness of breath or wheezing.).   budesonide-formoterol (SYMBICORT) 80-4.5 MCG/ACT inhaler Inhale 2 puffs into the lungs 2 (two) times daily.    hydrochlorothiazide (HYDRODIURIL) 25 MG tablet Take 1 tablet (25 mg total) by mouth daily.   loratadine (CLARITIN) 10 MG tablet Take 1 tablet by mouth daily.   pantoprazole (PROTONIX) 20 MG tablet TAKE 1 TABLET BY MOUTH DAILY.   vortioxetine HBr (TRINTELLIX) 10 MG TABS tablet Take 1 tablet (  10 mg total) by mouth daily.    Allergies: Patient is allergic to nsaids. Family History: Patient family history includes COPD in her paternal grandmother; Dementia in her maternal grandmother; Diabetes in her maternal grandfather; Migraines in her mother and sister; Osteoporosis in her mother. Social History:  Patient  reports that she has never smoked. She has never used smokeless tobacco. She reports current alcohol use of about 2.0 - 3.0 standard drinks of alcohol per week. She reports that she does not use drugs.  Review of Systems: Constitutional: Negative for fever malaise or anorexia Cardiovascular: negative for chest pain Respiratory: negative for SOB or persistent cough Gastrointestinal: negative for abdominal pain  OBJECTIVE/OBSERVATIONS: Today's Vitals   05/10/18 0820  BP: 122/88  Temp: 99.1 F (37.3 C)  TempSrc: Oral   There is no height or weight on file to calculate BMI.  General: no acute distress, well appearing, no apparent distress, well groomed Psych:  Alert and oriented x 3,normal mood, behavior, speech, dress, and thought processes.  Mildly flat affect  Leamon Arnt, MD

## 2018-05-16 ENCOUNTER — Ambulatory Visit (INDEPENDENT_AMBULATORY_CARE_PROVIDER_SITE_OTHER): Payer: 59 | Admitting: Psychology

## 2018-05-16 DIAGNOSIS — F411 Generalized anxiety disorder: Secondary | ICD-10-CM

## 2018-05-25 MED FILL — TRINTELLIX 10 MG TABLET: 10 | 30 days supply | Qty: 30 | Fill #1

## 2018-05-30 ENCOUNTER — Ambulatory Visit (INDEPENDENT_AMBULATORY_CARE_PROVIDER_SITE_OTHER): Payer: 59 | Admitting: Psychology

## 2018-05-30 DIAGNOSIS — F411 Generalized anxiety disorder: Secondary | ICD-10-CM

## 2018-06-02 DIAGNOSIS — F4323 Adjustment disorder with mixed anxiety and depressed mood: Secondary | ICD-10-CM | POA: Diagnosis not present

## 2018-06-02 DIAGNOSIS — F4321 Adjustment disorder with depressed mood: Secondary | ICD-10-CM | POA: Diagnosis not present

## 2018-06-09 DIAGNOSIS — K219 Gastro-esophageal reflux disease without esophagitis: Secondary | ICD-10-CM | POA: Diagnosis not present

## 2018-06-09 DIAGNOSIS — R1031 Right lower quadrant pain: Secondary | ICD-10-CM | POA: Diagnosis not present

## 2018-06-09 DIAGNOSIS — R194 Change in bowel habit: Secondary | ICD-10-CM | POA: Diagnosis not present

## 2018-06-09 DIAGNOSIS — Z1211 Encounter for screening for malignant neoplasm of colon: Secondary | ICD-10-CM | POA: Diagnosis not present

## 2018-06-09 DIAGNOSIS — K625 Hemorrhage of anus and rectum: Secondary | ICD-10-CM | POA: Diagnosis not present

## 2018-06-09 MED FILL — PEG-3350 AND ELECTROLYTES S: 236 | 1 days supply | Qty: 4000 | Fill #0

## 2018-06-13 DIAGNOSIS — F4321 Adjustment disorder with depressed mood: Secondary | ICD-10-CM | POA: Diagnosis not present

## 2018-06-13 DIAGNOSIS — F4323 Adjustment disorder with mixed anxiety and depressed mood: Secondary | ICD-10-CM | POA: Diagnosis not present

## 2018-06-17 DIAGNOSIS — R194 Change in bowel habit: Secondary | ICD-10-CM | POA: Diagnosis not present

## 2018-06-17 DIAGNOSIS — K625 Hemorrhage of anus and rectum: Secondary | ICD-10-CM | POA: Diagnosis not present

## 2018-06-17 DIAGNOSIS — Z1211 Encounter for screening for malignant neoplasm of colon: Secondary | ICD-10-CM | POA: Diagnosis not present

## 2018-06-22 ENCOUNTER — Ambulatory Visit (INDEPENDENT_AMBULATORY_CARE_PROVIDER_SITE_OTHER): Payer: 59 | Admitting: Psychology

## 2018-06-22 DIAGNOSIS — F411 Generalized anxiety disorder: Secondary | ICD-10-CM | POA: Diagnosis not present

## 2018-06-23 MED FILL — TRINTELLIX 10 MG TABLET: 10 | 30 days supply | Qty: 30 | Fill #2

## 2018-07-07 DIAGNOSIS — F4323 Adjustment disorder with mixed anxiety and depressed mood: Secondary | ICD-10-CM | POA: Diagnosis not present

## 2018-07-12 DIAGNOSIS — K219 Gastro-esophageal reflux disease without esophagitis: Secondary | ICD-10-CM | POA: Diagnosis not present

## 2018-07-12 DIAGNOSIS — E669 Obesity, unspecified: Secondary | ICD-10-CM | POA: Diagnosis not present

## 2018-07-12 DIAGNOSIS — K580 Irritable bowel syndrome with diarrhea: Secondary | ICD-10-CM | POA: Diagnosis not present

## 2018-07-12 MED FILL — OSCIMIN SL 0.125 MG TABLET: 0.125 | 30 days supply | Qty: 60 | Fill #0

## 2018-07-13 ENCOUNTER — Ambulatory Visit (INDEPENDENT_AMBULATORY_CARE_PROVIDER_SITE_OTHER): Payer: 59 | Admitting: Psychology

## 2018-07-13 DIAGNOSIS — F411 Generalized anxiety disorder: Secondary | ICD-10-CM

## 2018-07-25 MED FILL — PANTOPRAZOLE SOD DR 20 MG T: 20 | 90 days supply | Qty: 90 | Fill #0

## 2018-07-25 MED FILL — HYDROCHLOROTHIAZIDE 25 MG T: 25 | 90 days supply | Qty: 90 | Fill #0

## 2018-07-25 MED FILL — LISINOPRIL 5 MG TABLET: 5 | 90 days supply | Qty: 90 | Fill #0

## 2018-07-25 MED FILL — TRINTELLIX 10 MG TABLET: 10 | 30 days supply | Qty: 30 | Fill #0

## 2018-07-28 DIAGNOSIS — F4323 Adjustment disorder with mixed anxiety and depressed mood: Secondary | ICD-10-CM | POA: Diagnosis not present

## 2018-08-10 ENCOUNTER — Ambulatory Visit (INDEPENDENT_AMBULATORY_CARE_PROVIDER_SITE_OTHER): Payer: 59 | Admitting: Family Medicine

## 2018-08-10 ENCOUNTER — Encounter: Payer: Self-pay | Admitting: Family Medicine

## 2018-08-10 ENCOUNTER — Ambulatory Visit (INDEPENDENT_AMBULATORY_CARE_PROVIDER_SITE_OTHER): Payer: 59 | Admitting: Psychology

## 2018-08-10 VITALS — BP 118/79

## 2018-08-10 DIAGNOSIS — F330 Major depressive disorder, recurrent, mild: Secondary | ICD-10-CM | POA: Diagnosis not present

## 2018-08-10 DIAGNOSIS — I10 Essential (primary) hypertension: Secondary | ICD-10-CM

## 2018-08-10 DIAGNOSIS — F411 Generalized anxiety disorder: Secondary | ICD-10-CM

## 2018-08-10 DIAGNOSIS — E781 Pure hyperglyceridemia: Secondary | ICD-10-CM | POA: Diagnosis not present

## 2018-08-10 MED ORDER — CLONAZEPAM 0.5 MG PO TABS: 0.2500 mg | ORAL_TABLET | Freq: Every day | ORAL | 1 refills | Status: DC | PRN

## 2018-08-10 MED FILL — clonazePAM 0.5 MG TABS: 0.5 | 20 days supply | Qty: 20 | Fill #0

## 2018-08-10 NOTE — Progress Notes (Signed)
Virtual Visit via Video Note  SUBJECTIVE CC:  Chief Complaint  Patient presents with  . Hypertension    Home readings have improved  . Anxiety    Moods are improved but panic attacks are more    I connected with [place] on 08/10/18 at  8:20 AM EDT by a video enabled telemedicine application and verified that I am speaking with the correct person using two identifiers. Location patient: Home Location provider: [facility] [place] participating in the virtual visit: [place], Leamon Arnt, MD Lilli Light, [place] discussed the limitations of evaluation and management by telemedicine and the availability of in person appointments. The patient expressed understanding and agreed to proceed.  HPI: Monica Benjamin is a 38 y.o. female who was contacted today to address the problems listed above in the chief complaint/mood.  F/u for depression: doing well on trintellix. Low mood sxs are much improved. Home/marital life is better; but dealing with increasing work stress that is overwhelming her. Had panic attack last week. Has remote h/o panic attacks; had used prn low dose klonopin in the past with good results and rare use.continues with marital counseling.   Depression screen [facility] 2/9 05/10/2018 03/16/2018 02/15/2017  Decreased Interest 1 1 0  Down, Depressed, Hopeless 1 1 0  PHQ - 2 Score 2 2 0  Altered sleeping 2 2 0  Tired, decreased energy 3 2 1   Change in appetite 0 1 1  Feeling bad or failure about yourself  0 1 0  Trouble concentrating 3 1 0  Moving slowly or fidgety/restless 0 1 0  Suicidal thoughts 0 0 0  PHQ-9 Score 10 10 2   Difficult doing work/chores Not difficult at all Somewhat difficult Not difficult at all   GAD 7 : Generalized Anxiety Score 08/10/2018 03/16/2018 02/15/2017  Nervous, Anxious, on Edge 1 1 1   Control/stop worrying 1 1 1   Worry too much - different things 0 1 1  Trouble relaxing 1 1 1    Restless 0 1 0  Easily annoyed or irritable 1 2 1   Afraid - awful might happen 0 0 0  Total GAD 7 Score 4 7 5   Anxiety Difficulty Not difficult at all Somewhat difficult Not difficult at all    Hypertension f/u: Control is good . Pt reports she is doing well. taking medications as instructed, no medication side effects noted, no TIAs, no chest pain on exertion, no dyspnea on exertion, no swelling of ankles. Home readings are consistently normal now.  She denies adverse effects from his BP medications. Compliance with medication is good.   HLD: doing well on diet and using fish oil. Reviewed last labs. Had mildly elevated trigs.   BP Readings from Last 3 Encounters:  08/10/18 118/79  05/10/18 122/88  03/16/18 128/86   Wt Readings from Last 3 Encounters:  03/16/18 204 lb 12.8 oz (92.9 kg)  11/11/17 216 lb 6.4 oz (98.2 kg)  10/08/17 215 lb (97.5 kg)    Lab Results  Component Value Date   CHOL 214 (H) 03/16/2018   CHOL 223 (H) 08/17/2017   Lab Results  Component Value Date   HDL 47.50 03/16/2018   HDL 40.70 08/17/2017   No results found for: [facility] Lab Results  Component Value Date   TRIG 314.0 (H) 03/16/2018   TRIG (H) 08/17/2017    429.0 Triglyceride is over 400; calculations on Lipids are invalid.  Lab Results  Component Value Date   CHOLHDL 5 03/16/2018   CHOLHDL 5 08/17/2017   Lab Results  Component Value Date   LDLDIRECT 137.0 03/16/2018   LDLDIRECT 117.0 08/17/2017   Lab Results  Component Value Date   CREATININE 0.82 03/16/2018   BUN 14 03/16/2018   NA 137 03/16/2018   K 3.5 03/16/2018   CL 97 03/16/2018   CO2 32 03/16/2018    The ASCVD Risk score (Goff DC Jr., et al., 2013) failed to calculate for the following reasons:   The 2013 ASCVD risk score is only valid for ages 81 to 47  ASSESSMENT 1. GAD (generalized anxiety disorder)   2. Essential hypertension   3. Hypertriglyceridemia   4. Mild episode of recurrent major depressive disorder (H[facility])       Depression:  With panic sxs due to work stressors; counseling done. Continue trintellix and add low dose klonopin back. F/u 6 months; sooner if needed. Overall, improved.   HTN: This medical condition is well controlled. There are no signs of complications, medication side effects, or red flags. Patient is instructed to continue the current treatment plan without change in therapies or medications.   Elevated trigs: will recheck in feb at [facility]. Continue healthy diet and exercise.   I discussed the assessment and treatment plan with the patient. The patient was provided an opportunity to ask questions and all were answered. The patient agreed with the plan and demonstrated an understanding of the instructions.   The patient was advised to call back or seek an in-person evaluation if the symptoms worsen or if the condition fails to improve as anticipated. Follow up: Return in about 6 months (around 02/10/2019).  Visit date not found  No orders of the defined types were placed in this encounter.     I reviewed the patients updated PMH, FH, and SocHx.    Patient Active Problem List   Diagnosis Date Noted  . Essential hypertension 03/16/2018    Priority: High  . OSA on CPAP 05/25/2016    Priority: High  . Moderate persistent asthma without complication 01/75/1025    Priority: High  . Chronic seasonal allergic rhinitis due to pollen 11/25/2015    Priority: Medium  . GAD (generalized anxiety disorder) 11/25/2015    Priority: Medium  . Gastroesophageal reflux disease without esophagitis 11/25/2015    Priority: Medium  . Irritable bowel syndrome with both constipation and diarrhea 11/25/2015    Priority: Medium  . Mild episode of recurrent major depressive disorder ([place]) 05/10/2018  . Hypertriglyceridemia 05/10/2018  . Infertility, female 02/15/2017   Current Meds  Medication Sig  . albuterol (PROVENTIL HFA;VENTOLIN HFA) 108 (90 Base) MCG/ACT inhaler Inhale 2 puffs into the lungs  every 4 (four) hours as needed for wheezing (cough, shortness of breath or wheezing.).  Marland Kitchen budesonide-formoterol (SYMBICORT) 80-4.5 MCG/ACT inhaler Inhale 2 puffs into the lungs 2 (two) times daily.   . hydrochlorothiazide (HYDRODIURIL) 25 MG tablet Take 1 tablet (25 mg total) by mouth daily.  Marland Kitchen lisinopril (PRINIVIL,ZESTRIL) 5 MG tablet Take 1 tablet (5 mg total) by mouth daily.  Marland Kitchen loratadine (CLARITIN) 10 MG tablet Take 1 tablet by mouth daily.  . pantoprazole (PROTONIX) 20 MG tablet TAKE 1 TABLET BY MOUTH DAILY.  Marland Kitchen vortioxetine HBr (TRINTELLIX) 10 MG TABS tablet Take 1 tablet (10 mg total) by mouth daily.    Allergies: Patient is allergic to nsaids. Family History: Patient family history includes COPD in her paternal grandmother; Dementia in her  maternal grandmother; Diabetes in her maternal grandfather; Migraines in her mother and sister; Osteoporosis in her mother. Social History:  Patient  reports that she has never smoked. She has never used smokeless tobacco. She reports current alcohol use of about 2.0 - 3.0 standard drinks of alcohol per week. She reports that she does not use drugs.  Review of Systems: Constitutional: Negative for fever malaise or anorexia Cardiovascular: negative for chest pain Respiratory: negative for SOB or persistent cough Gastrointestinal: negative for abdominal pain  OBJECTIVE/OBSERVATIONS: General: no acute distress, well appearing, no apparent distress, well groomed Psych:  Alert and oriented x 3,normal mood, behavior, speech, dress, and thought processes.   Leamon Arnt, MD

## 2018-08-16 DIAGNOSIS — F4323 Adjustment disorder with mixed anxiety and depressed mood: Secondary | ICD-10-CM | POA: Diagnosis not present

## 2018-08-31 ENCOUNTER — Ambulatory Visit (INDEPENDENT_AMBULATORY_CARE_PROVIDER_SITE_OTHER): Payer: 59 | Admitting: Psychology

## 2018-08-31 DIAGNOSIS — F411 Generalized anxiety disorder: Secondary | ICD-10-CM

## 2018-09-20 DIAGNOSIS — F4323 Adjustment disorder with mixed anxiety and depressed mood: Secondary | ICD-10-CM | POA: Diagnosis not present

## 2018-09-20 MED FILL — TRINTELLIX 10 MG TABLET: 10 | 30 days supply | Qty: 30 | Fill #1

## 2018-10-05 ENCOUNTER — Ambulatory Visit (INDEPENDENT_AMBULATORY_CARE_PROVIDER_SITE_OTHER): Payer: 59 | Admitting: Psychology

## 2018-10-05 DIAGNOSIS — F411 Generalized anxiety disorder: Secondary | ICD-10-CM | POA: Diagnosis not present

## 2018-10-11 DIAGNOSIS — F4323 Adjustment disorder with mixed anxiety and depressed mood: Secondary | ICD-10-CM | POA: Diagnosis not present

## 2018-10-12 ENCOUNTER — Other Ambulatory Visit: Payer: Self-pay

## 2018-10-14 ENCOUNTER — Ambulatory Visit: Payer: 59 | Admitting: Certified Nurse Midwife

## 2018-10-17 ENCOUNTER — Other Ambulatory Visit (HOSPITAL_COMMUNITY)
Admission: RE | Admit: 2018-10-17 | Discharge: 2018-10-17 | Disposition: A | Discharge status: Home / Self Care | Payer: 59 | Source: Ambulatory Visit | Attending: Obstetrics & Gynecology | Admitting: Obstetrics & Gynecology

## 2018-10-17 ENCOUNTER — Ambulatory Visit (INDEPENDENT_AMBULATORY_CARE_PROVIDER_SITE_OTHER): Payer: 59 | Admitting: Certified Nurse Midwife

## 2018-10-17 ENCOUNTER — Encounter: Payer: Self-pay | Admitting: Certified Nurse Midwife

## 2018-10-17 ENCOUNTER — Other Ambulatory Visit: Payer: Self-pay

## 2018-10-17 VITALS — BP 118/80 | HR 70 | Temp 97.0°F | Resp 16 | Ht 63.5 in | Wt 213.0 lb

## 2018-10-17 DIAGNOSIS — Z124 Encounter for screening for malignant neoplasm of cervix: Secondary | ICD-10-CM | POA: Insufficient documentation

## 2018-10-17 DIAGNOSIS — Z01419 Encounter for gynecological examination (general) (routine) without abnormal findings: Secondary | ICD-10-CM | POA: Diagnosis not present

## 2018-10-17 NOTE — Patient Instructions (Signed)

## 2018-10-17 NOTE — Progress Notes (Signed)
38 y.o. G0P0000 Married  Caucasian Fe here for annual exam. Periods getting lighter and duration is 2 days. Occasional cramping, no issues. Has had migraine with periods recently, a change for her, no aura. Relieved with OTC.  Had colonoscopy 2 months ago all normal just IBS.Marland Kitchen Aware weight has not changed, but eating healthy. Sees PCP Dr. Jonni Sanger for aex, hypertension and depression medication management and labs. No other health issues today.  Patient's last menstrual period was 10/05/2018 (exact date).          Sexually active: Yes.    The current method of family planning is none. Female partner    Exercising: Yes.    walking Smoker:  no  Review of Systems  Constitutional: Negative.   HENT: Negative.        Menstrual migraines, ovulatory cramping  Eyes: Negative.   Respiratory: Negative.   Cardiovascular: Negative.   Gastrointestinal: Negative.   Genitourinary: Negative.   Musculoskeletal: Negative.   Skin: Negative.   Neurological: Negative.   Endo/Heme/Allergies: Negative.   Psychiatric/Behavioral: Negative.     Health Maintenance: Pap:  05-24-15 neg HPV HR neg History of Abnormal Pap: no MMG: none Self Breast exams: yes Colonoscopy: 2020 IBS BMD:   none TDaP:  2015 Shingles: no Pneumonia: no Hep C and HIV: HIV neg 2017 Labs: if needed   reports that she has never smoked. She has never used smokeless tobacco. She reports current alcohol use of about 2.0 - 3.0 standard drinks of alcohol per week. She reports that she does not use drugs.  Past Medical History:  Diagnosis Date  . Allergy   . Anxiety   . Asthma   . Depression   . Hypertension   . Infertility, female 02/15/2017   IVF treatment  . Migraines   . Trigeminal neuralgia     Past Surgical History:  Procedure Laterality Date  . WISDOM TOOTH EXTRACTION      Current Outpatient Medications  Medication Sig Dispense Refill  . albuterol (PROVENTIL HFA;VENTOLIN HFA) 108 (90 Base) MCG/ACT inhaler Inhale 2 puffs  into the lungs every 4 (four) hours as needed for wheezing (cough, shortness of breath or wheezing.). 1 Inhaler 1  . budesonide-formoterol (SYMBICORT) 80-4.5 MCG/ACT inhaler Inhale 2 puffs into the lungs 2 (two) times daily.     . clonazePAM (KLONOPIN) 0.5 MG tablet Take 0.5-1 tablets (0.25-0.5 mg total) by mouth daily as needed for anxiety. 20 tablet 1  . hydrochlorothiazide (HYDRODIURIL) 25 MG tablet Take 1 tablet (25 mg total) by mouth daily. 90 tablet 3  . Hyoscyamine Sulfate (HYOSCYAMINE PO) Take by mouth.    Marland Kitchen lisinopril (PRINIVIL,ZESTRIL) 5 MG tablet Take 1 tablet (5 mg total) by mouth daily. 90 tablet 3  . loratadine (CLARITIN) 10 MG tablet Take 1 tablet by mouth daily.    . pantoprazole (PROTONIX) 20 MG tablet TAKE 1 TABLET BY MOUTH DAILY. 90 tablet 3  . vortioxetine HBr (TRINTELLIX) 10 MG TABS tablet Take 1 tablet (10 mg total) by mouth daily. 30 tablet 11   No current facility-administered medications for this visit.     Family History  Problem Relation Age of Onset  . Osteoporosis Mother   . Migraines Mother   . Migraines Sister   . Dementia Maternal Grandmother   . Diabetes Maternal Grandfather   . COPD Paternal Grandmother   . Cancer Neg Hx   . Heart disease Neg Hx   . Hypertension Neg Hx     ROS:  Pertinent items are  noted in HPI.  Otherwise, a comprehensive ROS was negative.  Exam:   BP 118/80   Pulse 70   Temp (!) 97 F (36.1 C) (Skin)   Resp 16   Ht 5' 3.5" (1.613 m)   Wt 213 lb (96.6 kg)   LMP 10/05/2018 (Exact Date)   BMI 37.14 kg/m  Height: 5' 3.5" (161.3 cm) Ht Readings from Last 3 Encounters:  10/17/18 5' 3.5" (1.613 m)  03/16/18 5\' 4"  (1.626 m)  11/11/17 5\' 4"  (1.626 m)    General appearance: alert, cooperative and appears stated age Head: Normocephalic, without obvious abnormality, atraumatic Neck: no adenopathy, supple, symmetrical, trachea midline and thyroid normal to inspection and palpation Lungs: clear to auscultation  bilaterally Breasts: normal appearance, no masses or tenderness, No nipple retraction or dimpling, No nipple discharge or bleeding, No axillary or supraclavicular adenopathy Heart: regular rate and rhythm Abdomen: soft, non-tender; no masses,  no organomegaly Extremities: extremities normal, atraumatic, no cyanosis or edema Skin: Skin color, texture, turgor normal. No rashes or lesions Lymph nodes: Cervical, supraclavicular, and axillary nodes normal. No abnormal inguinal nodes palpated Neurologic: Grossly normal   Pelvic: External genitalia:  no lesions              Urethra:  normal appearing urethra with no masses, tenderness or lesions              Bartholin's and Skene's: normal                 Vagina: normal appearing vagina with normal color and discharge, no lesions              Cervix: no cervical motion tenderness and no lesions              Pap taken: Yes.   Bimanual Exam:  Uterus:  normal size, contour, position, consistency, mobility, non-tender and anteverted              Adnexa: normal adnexa and no mass, fullness, tenderness               Rectovaginal: Confirms               Anus:  normal sphincter tone, no lesions  Chaperone present: yes  A:  Well Woman with normal exam  Contraception none needed( has wife)  Migraine HA with period only  IBS noted with colonoscopy  Hypertension/depression with PCP management  Mammogram due  P:   Reviewed health and wellness pertinent to exam  Discussed if migraine aura occurring needs to advise. Comfort measures discussed as well staying hydrated.  Continue follow up with Dr. Collene Mares as indicated  Continue follow up with PCP as indicated  Given information and expectations with first mammogram. Questions addressed.  Pap smear: yes   counseled on breast self exam, mammography screening, feminine hygiene, adequate intake of calcium and vitamin D, diet and exercise  return annually or prn  An After Visit Summary was printed and  given to the patient.

## 2018-10-19 LAB — CYTOLOGY - PAP: Diagnosis: NEGATIVE

## 2018-10-21 MED FILL — TRINTELLIX 10 MG TABLET: 10 | 30 days supply | Qty: 30 | Fill #2

## 2018-10-21 MED FILL — PANTOPRAZOLE SOD DR 20 MG T: 20 | 90 days supply | Qty: 90 | Fill #1

## 2018-10-26 ENCOUNTER — Ambulatory Visit: Payer: 59 | Admitting: Psychology

## 2018-10-26 MED FILL — clonazePAM 0.5 MG TABS: 0.5 | 20 days supply | Qty: 20 | Fill #1

## 2018-11-15 DIAGNOSIS — F4323 Adjustment disorder with mixed anxiety and depressed mood: Secondary | ICD-10-CM | POA: Diagnosis not present

## 2018-11-16 ENCOUNTER — Ambulatory Visit (INDEPENDENT_AMBULATORY_CARE_PROVIDER_SITE_OTHER): Payer: 59 | Admitting: Psychology

## 2018-11-16 DIAGNOSIS — F411 Generalized anxiety disorder: Secondary | ICD-10-CM

## 2018-11-17 ENCOUNTER — Other Ambulatory Visit: Payer: Self-pay | Admitting: Certified Nurse Midwife

## 2018-11-17 DIAGNOSIS — Z1231 Encounter for screening mammogram for malignant neoplasm of breast: Secondary | ICD-10-CM

## 2018-11-21 MED FILL — TRINTELLIX 10 MG TABLET: 10 | 30 days supply | Qty: 30 | Fill #3

## 2018-12-06 ENCOUNTER — Ambulatory Visit (INDEPENDENT_AMBULATORY_CARE_PROVIDER_SITE_OTHER): Payer: 59 | Admitting: Psychology

## 2018-12-06 DIAGNOSIS — F411 Generalized anxiety disorder: Secondary | ICD-10-CM | POA: Diagnosis not present

## 2018-12-13 DIAGNOSIS — F4323 Adjustment disorder with mixed anxiety and depressed mood: Secondary | ICD-10-CM | POA: Diagnosis not present

## 2018-12-19 MED FILL — TRINTELLIX 10 MG TABLET: 10 | 30 days supply | Qty: 30 | Fill #4

## 2019-01-03 DIAGNOSIS — F4323 Adjustment disorder with mixed anxiety and depressed mood: Secondary | ICD-10-CM | POA: Diagnosis not present

## 2019-01-05 ENCOUNTER — Other Ambulatory Visit: Payer: Self-pay

## 2019-01-05 ENCOUNTER — Ambulatory Visit
Admission: RE | Admit: 2019-01-05 | Discharge: 2019-01-05 | Disposition: A | Discharge status: Home / Self Care | Payer: 59 | Source: Ambulatory Visit | Attending: Certified Nurse Midwife | Admitting: Certified Nurse Midwife

## 2019-01-05 DIAGNOSIS — Z1231 Encounter for screening mammogram for malignant neoplasm of breast: Secondary | ICD-10-CM | POA: Diagnosis not present

## 2019-01-09 ENCOUNTER — Ambulatory Visit (INDEPENDENT_AMBULATORY_CARE_PROVIDER_SITE_OTHER): Payer: 59 | Admitting: Psychology

## 2019-01-09 ENCOUNTER — Other Ambulatory Visit: Payer: Self-pay | Admitting: Certified Nurse Midwife

## 2019-01-09 DIAGNOSIS — R928 Other abnormal and inconclusive findings on diagnostic imaging of breast: Secondary | ICD-10-CM

## 2019-01-09 DIAGNOSIS — F411 Generalized anxiety disorder: Secondary | ICD-10-CM

## 2019-01-13 ENCOUNTER — Other Ambulatory Visit: Payer: Self-pay

## 2019-01-13 ENCOUNTER — Other Ambulatory Visit: Payer: Self-pay | Admitting: Certified Nurse Midwife

## 2019-01-13 ENCOUNTER — Ambulatory Visit
Admission: RE | Admit: 2019-01-13 | Discharge: 2019-01-13 | Disposition: A | Discharge status: Home / Self Care | Payer: 59 | Source: Ambulatory Visit | Attending: Certified Nurse Midwife | Admitting: Certified Nurse Midwife

## 2019-01-13 DIAGNOSIS — N6321 Unspecified lump in the left breast, upper outer quadrant: Secondary | ICD-10-CM | POA: Diagnosis not present

## 2019-01-13 DIAGNOSIS — N6320 Unspecified lump in the left breast, unspecified quadrant: Secondary | ICD-10-CM

## 2019-01-13 DIAGNOSIS — R928 Other abnormal and inconclusive findings on diagnostic imaging of breast: Secondary | ICD-10-CM

## 2019-01-17 ENCOUNTER — Other Ambulatory Visit: Payer: Self-pay | Admitting: Family Medicine

## 2019-01-17 ENCOUNTER — Other Ambulatory Visit: Payer: Self-pay

## 2019-01-17 DIAGNOSIS — I10 Essential (primary) hypertension: Secondary | ICD-10-CM

## 2019-01-17 MED ORDER — HYDROCHLOROTHIAZIDE 25 MG PO TABS: 25.0000 mg | ORAL_TABLET | Freq: Every day | ORAL | 3 refills | Status: DC

## 2019-01-17 MED FILL — HYDROCHLOROTHIAZIDE 25 MG T: 25 | 90 days supply | Qty: 90 | Fill #0

## 2019-01-17 MED FILL — PANTOPRAZOLE SOD DR 20 MG T: 20 | 90 days supply | Qty: 90 | Fill #2

## 2019-01-17 MED FILL — clonazePAM 0.5 MG TABS: 0.5 | 20 days supply | Qty: 20 | Fill #0

## 2019-01-17 MED FILL — TRINTELLIX 10 MG TABLET: 10 | 30 days supply | Qty: 30 | Fill #5

## 2019-01-17 NOTE — Telephone Encounter (Signed)
Patient is requesting a refill for clonazepam 0.5 mg tabs  Last filled: 10/26/2018 LOV: 08/10/2018 Upcoming OV: not on file

## 2019-02-02 DIAGNOSIS — F4323 Adjustment disorder with mixed anxiety and depressed mood: Secondary | ICD-10-CM | POA: Diagnosis not present

## 2019-02-03 ENCOUNTER — Ambulatory Visit (INDEPENDENT_AMBULATORY_CARE_PROVIDER_SITE_OTHER): Payer: 59 | Admitting: Psychology

## 2019-02-03 DIAGNOSIS — F411 Generalized anxiety disorder: Secondary | ICD-10-CM | POA: Diagnosis not present

## 2019-02-06 IMAGING — US US PELVIS COMPLETE TRANSABD/TRANSVAG
1 series · 13 of 25 positions shown · non-contrast
Comparison: None

CLINICAL DATA: Onset of pelvic pain last night around 8 p.m..
History of infertility.

EXAM:
TRANSABDOMINAL AND TRANSVAGINAL ULTRASOUND OF PELVIS
TECHNIQUE: Both transabdominal and transvaginal ultrasound examinations of the
pelvis were performed. Transabdominal technique was performed for
global imaging of the pelvis including uterus, ovaries, adnexal
regions, and pelvic cul-de-sac. It was necessary to proceed with
endovaginal exam following the transabdominal exam to visualize the
uterus, endometrium, ovaries, and adnexal structures..

[Series 1: us pelvis complete transabd/transvag · 0.17mm/px · 13 of 62 slices shown]
[im 1/62]
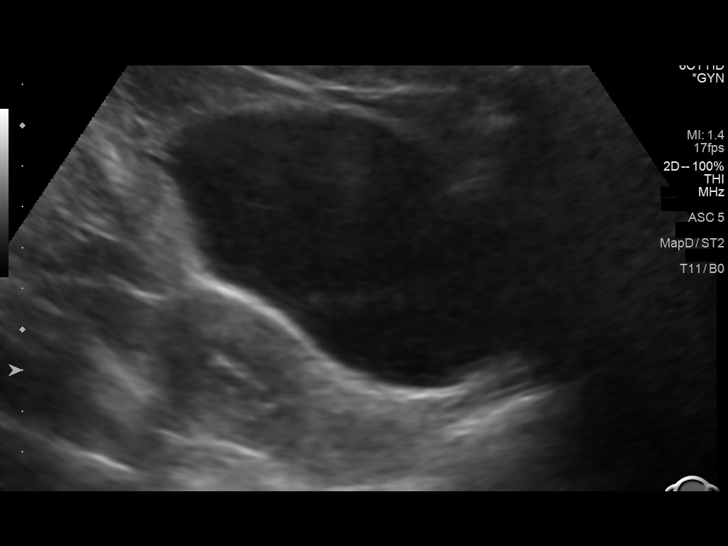
[im 6/62]
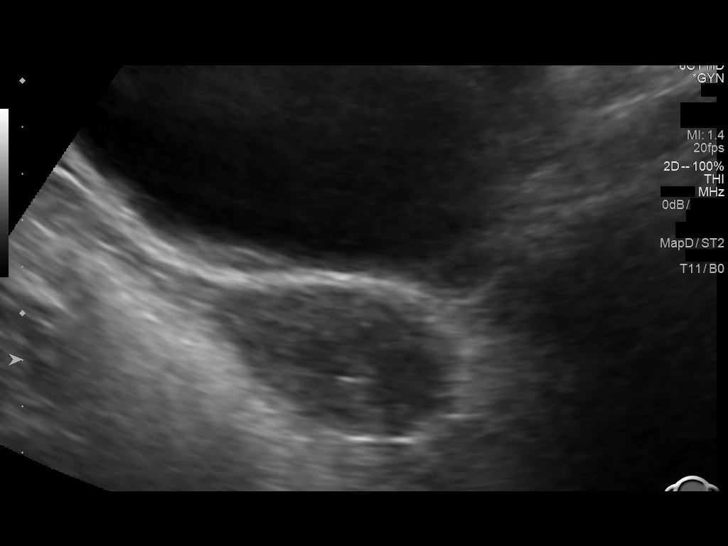
[im 11/62]
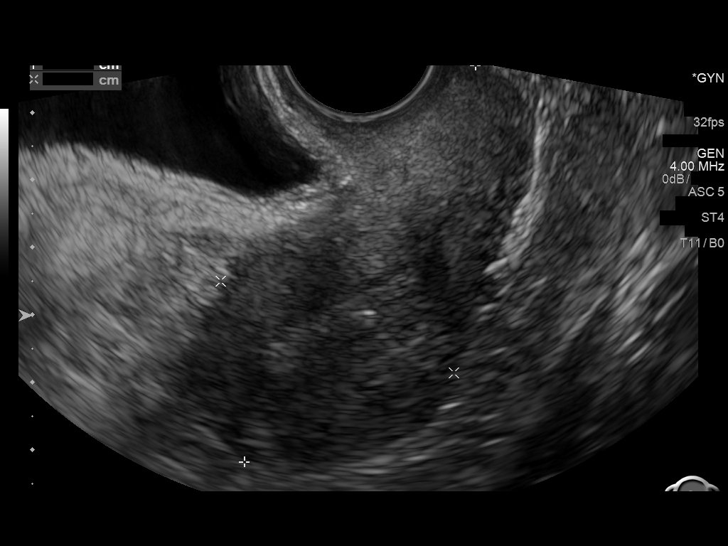
[im 16/62]
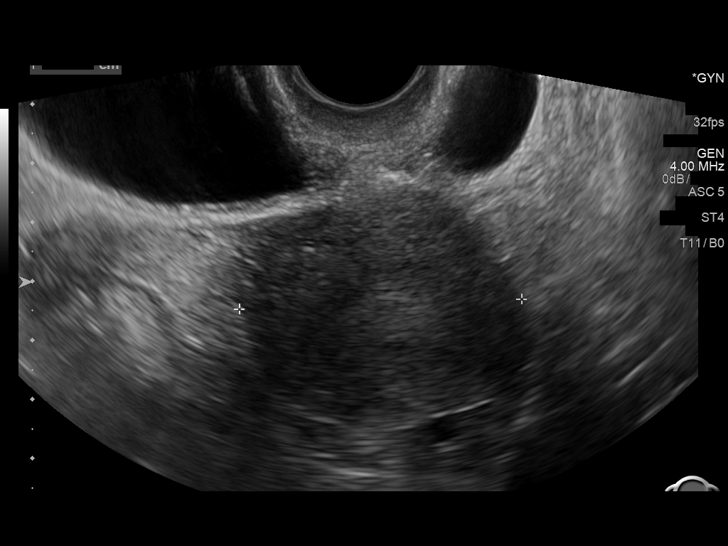
[im 21/62]
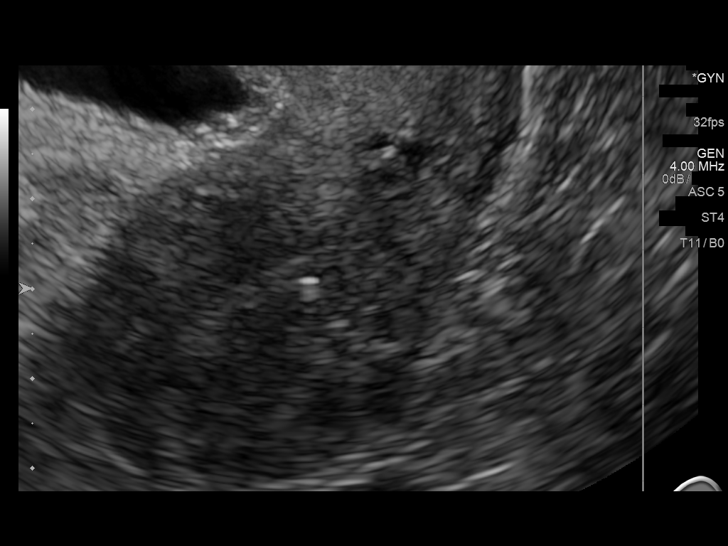
[im 26/62]
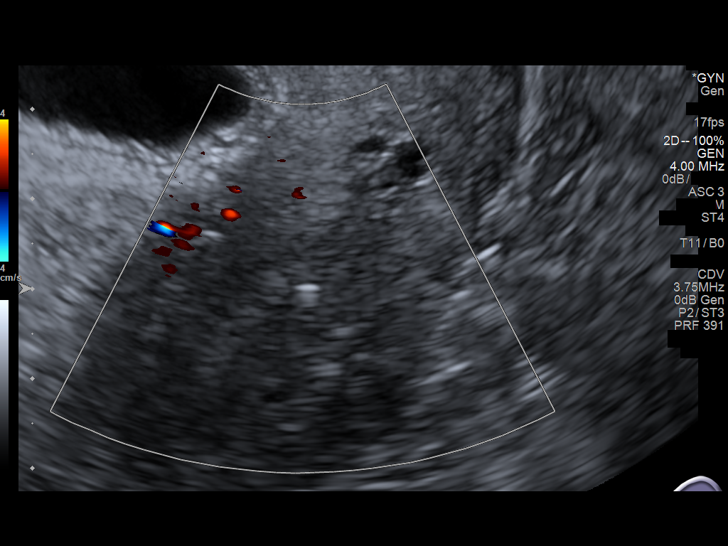
[im 31/62]
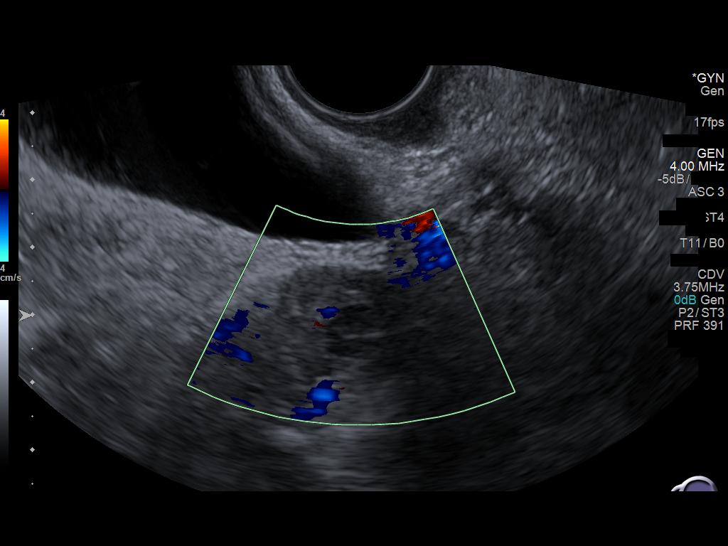
[im 36/62]
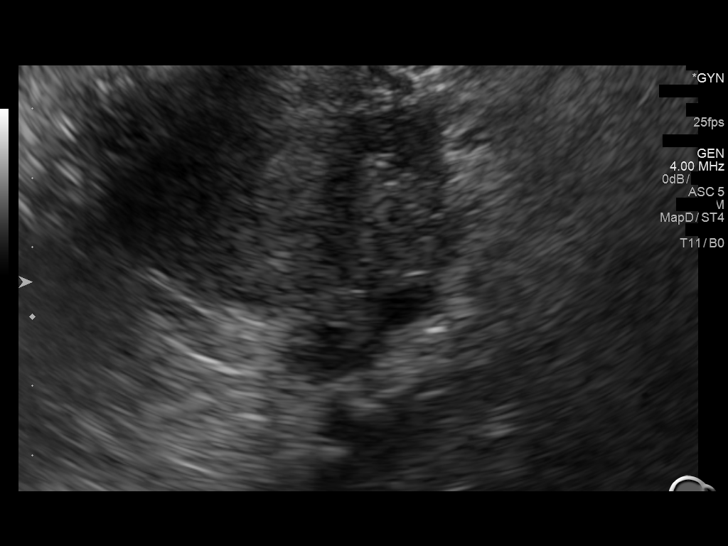
[im 41/62]
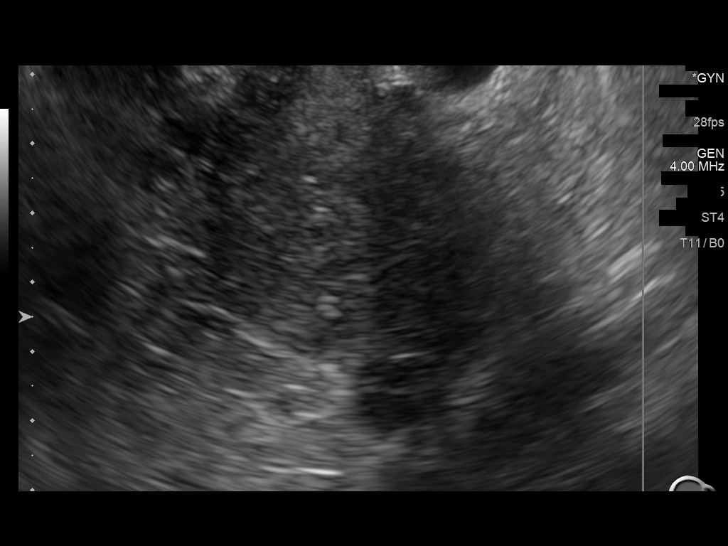
[im 46/62]
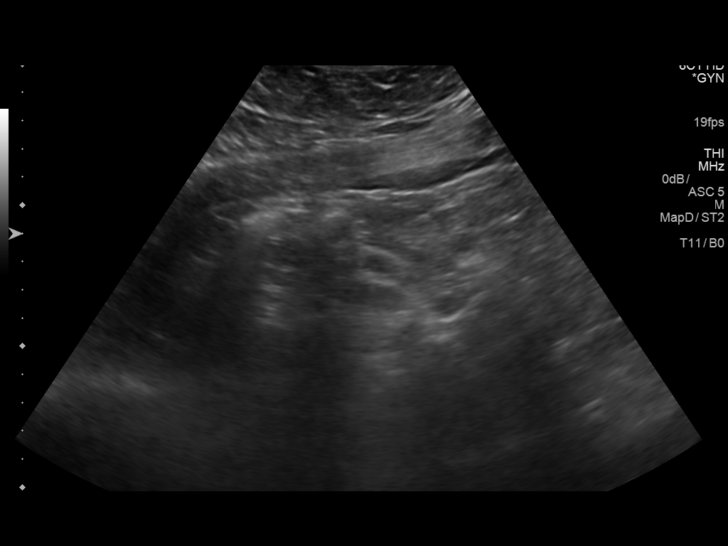
[im 51/62]
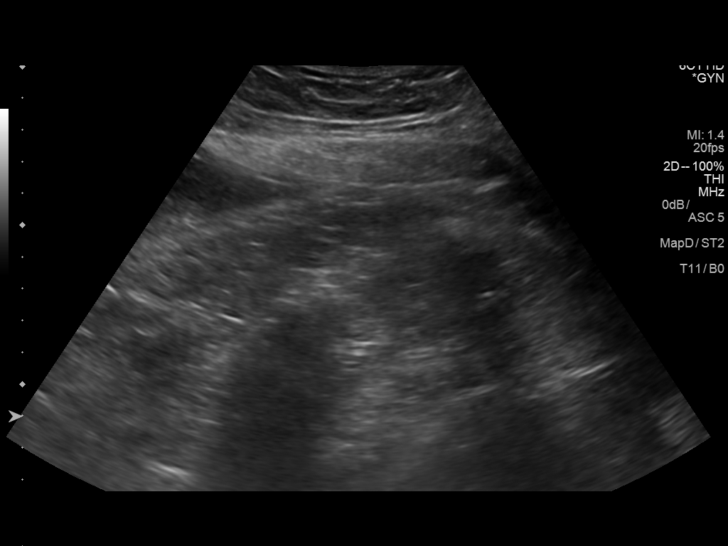
[im 56/62]
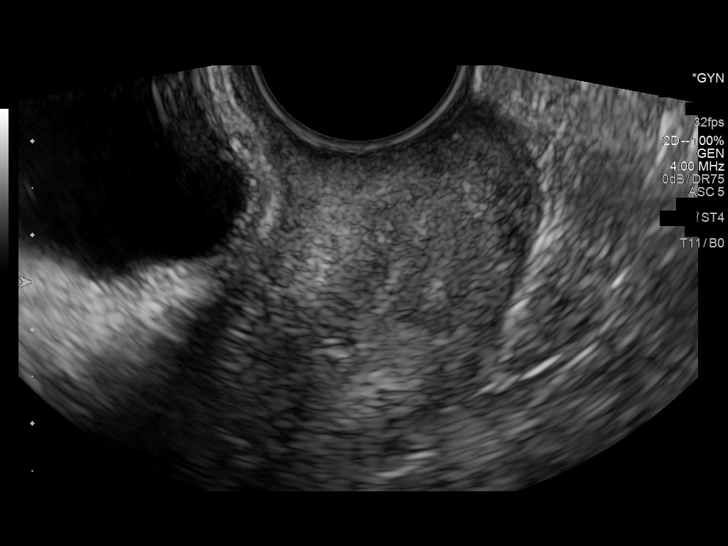
[im 62/62]
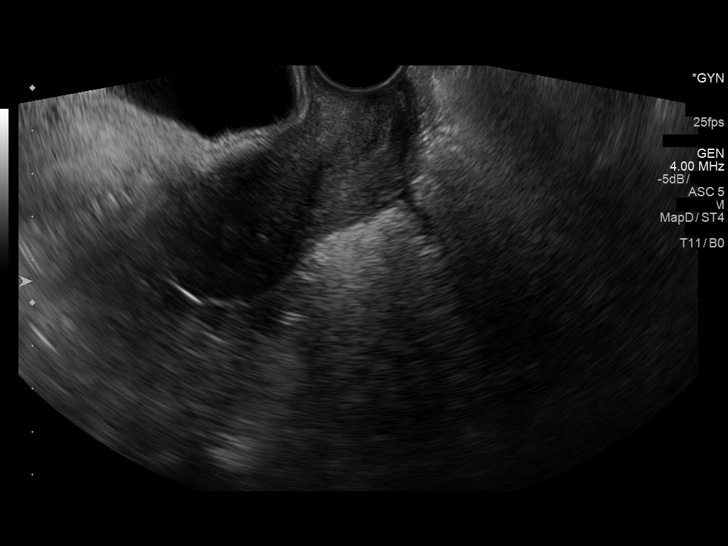

[13 of 25 positions shown; findings below may reference images not displayed]

FINDINGS: Uterus

Measurements: 6.8 x 3.7 x 4.8 cm. The myometrial echotexture is
heterogeneous. A punctate echogenic focus is observed in the
posterior aspect of the uterine corpus likely reflecting
calcification. There are nabothian cysts.

Endometrium

Thickness: 4 mm.  No focal abnormality visualized.

Right ovary

Measurements: 2.8 x 1.6 x 0.9 cm. Normal appearance/no adnexal mass.

Left ovary

Measurements: 2.6 x 1.3 x 1.8 cm.. Normal appearance/no adnexal
mass.

Other findings

No abnormal free fluid. There is considerable stool present within
bowel posterior to the right of the uterus and right ovary.
IMPRESSION: Normal uterine size. No suspicious myometrial masses. Normal
endometrial stripe thickness.

Normal appearance of both ovaries.

Increased stool noted within bowel to the right of the uterus and
right ovary may reflect constipation in the appropriate clinical
setting.

## 2019-02-17 MED FILL — TRINTELLIX 10 MG TABLET: 10 | 30 days supply | Qty: 30 | Fill #6

## 2019-02-21 ENCOUNTER — Other Ambulatory Visit: Payer: Self-pay

## 2019-02-22 ENCOUNTER — Ambulatory Visit (INDEPENDENT_AMBULATORY_CARE_PROVIDER_SITE_OTHER): Payer: 59 | Admitting: Family Medicine

## 2019-02-22 ENCOUNTER — Encounter: Payer: Self-pay | Admitting: Family Medicine

## 2019-02-22 VITALS — Temp 99.2°F | Ht 63.5 in | Wt 209.0 lb

## 2019-02-22 DIAGNOSIS — J45901 Unspecified asthma with (acute) exacerbation: Secondary | ICD-10-CM | POA: Diagnosis not present

## 2019-02-22 DIAGNOSIS — J209 Acute bronchitis, unspecified: Secondary | ICD-10-CM | POA: Diagnosis not present

## 2019-02-22 MED ORDER — PREDNISONE 20 MG PO TABS: ORAL_TABLET | ORAL | 0 refills | Status: DC

## 2019-02-22 MED ORDER — GUAIFENESIN-CODEINE 100-10 MG/5ML PO SOLN: 5.0000 mL | Freq: Four times a day (QID) | ORAL | 0 refills | Status: DC | PRN

## 2019-02-22 MED ORDER — AZITHROMYCIN 250 MG PO TABS: ORAL_TABLET | ORAL | 0 refills | Status: DC

## 2019-02-22 MED FILL — predniSONE 20 MG TABS: 20 | 5 days supply | Qty: 15 | Fill #0

## 2019-02-22 MED FILL — AZITHROMYCIN 250 MG TABS: 250 | 5 days supply | Qty: 6 | Fill #0

## 2019-02-22 NOTE — Progress Notes (Signed)
Virtual Visit via Video Note  Subjective  CC:  Chief Complaint  Patient presents with  . Cough    Symptoms started last Friday evening 1/22/221. Patient has already been tested which came back negative.  . Wheezing     I connected with [place] on 02/22/19 at  3:40 PM EST by a video enabled telemedicine application and verified that I am speaking with the correct person using two identifiers. Location patient: Home Location provider: [facility] [place], Office Persons participating in the virtual visit: [place], Leamon Arnt, MD Serita Sheller, [place] discussed the limitations of evaluation and management by telemedicine and the availability of in person appointments. The patient expressed understanding and agreed to proceed. HPI: Monica Benjamin is a 39 y.o. female who was contacted today to address the problems listed above in the chief complaint. . 39 year old with mild persistent asthma complains of almost week history of asthma exacerbation associated with bronchitis.  Has congestion, wheezing without shortness of breath, low-grade fevers and hacking productive cough.  Has not had a recent asthma exacerbation.  Has been using her maintenance inhaler and albuterol without significant relief.  She was tested for Covid earlier this week, fortunately the test came back negative.  She has no GI symptoms.  No nausea vomiting or diarrhea.  She is eating well. Assessment  1. Acute bronchitis with asthma with acute exacerbation      Plan   Asthma exacerbation due to bronchitis : continue inhalers, add prednisone, cough syrup and an antibiotic.  Monitor for shortness of breath or worsening symptoms.  Follow-up as needed.  Patient is due for complete physical next month.  She will call to get scheduled I discussed the assessment and treatment plan with the patient. The patient was provided an opportunity to ask questions and all were  answered. The patient agreed with the plan and demonstrated an understanding of the instructions.   The patient was advised to call back or seek an in-person evaluation if the symptoms worsen or if the condition fails to improve as anticipated. Follow up: CPE in February Visit date not found  Meds ordered this encounter  Medications  . predniSONE (DELTASONE) 20 MG tablet    Sig: Take 3 tabs daily for 5 days    Dispense:  15 tablet    Refill:  0  . azithromycin (ZITHROMAX) 250 MG tablet    Sig: Take 2 tabs today, then 1 tab daily for 4 days    Dispense:  1 each    Refill:  0  . guaiFENesin-codeine 100-10 MG/5ML syrup    Sig: Take 5 mLs by mouth every 6 (six) hours as needed for cough.    Dispense:  120 mL    Refill:  0      I reviewed the patients updated PMH, FH, and SocHx.    Patient Active Problem List   Diagnosis Date Noted  . Essential hypertension 03/16/2018    Priority: High  . OSA on CPAP 05/25/2016    Priority: High  . Moderate persistent asthma without complication [id number]    Priority: High  . Chronic seasonal allergic rhinitis due to pollen 11/25/2015    Priority: Medium  . GAD (generalized anxiety disorder) 11/25/2015    Priority: Medium  . Gastroesophageal reflux disease without esophagitis 11/25/2015    Priority: Medium  . Irritable bowel syndrome with both constipation and diarrhea 11/25/2015    Priority: Medium  .  Mild episode of recurrent major depressive disorder ([place]) 05/10/2018  . Hypertriglyceridemia 05/10/2018  . Infertility, female 02/15/2017   Current Meds  Medication Sig  . albuterol (PROVENTIL HFA;VENTOLIN HFA) 108 (90 Base) MCG/ACT inhaler Inhale 2 puffs into the lungs every 4 (four) hours as needed for wheezing (cough, shortness of breath or wheezing.).  Marland Kitchen budesonide-formoterol (SYMBICORT) 80-4.5 MCG/ACT inhaler Inhale 2 puffs into the lungs 2 (two) times daily.   . clonazePAM (KLONOPIN) 0.5 MG tablet TAKE 1/2-1 TABLET BY MOUTH DAILY AS  NEEDED FOR ANXIETY.  . hydrochlorothiazide (HYDRODIURIL) 25 MG tablet Take 1 tablet (25 mg total) by mouth daily.  Marland Kitchen Hyoscyamine Sulfate (HYOSCYAMINE PO) Take by mouth.  Marland Kitchen lisinopril (PRINIVIL,ZESTRIL) 5 MG tablet Take 1 tablet (5 mg total) by mouth daily.  Marland Kitchen loratadine (CLARITIN) 10 MG tablet Take 1 tablet by mouth daily.  . pantoprazole (PROTONIX) 20 MG tablet TAKE 1 TABLET BY MOUTH DAILY.  Marland Kitchen vortioxetine HBr (TRINTELLIX) 10 MG TABS tablet Take 1 tablet (10 mg total) by mouth daily.    Allergies: Patient is allergic to nsaids. Family History: Patient family history includes COPD in her paternal grandmother; Dementia in her maternal grandmother; Diabetes in her maternal grandfather; Migraines in her mother and sister; Osteoporosis in her mother. Social History:  Patient  reports that she has never smoked. She has never used smokeless tobacco. She reports current alcohol use of about 2.0 - 3.0 standard drinks of alcohol per week. She reports that she does not use drugs.  Review of Systems: Constitutional: Negative for malaise or anorexia, no fever Cardiovascular: negative for chest pain Respiratory: Positive for SOB or persistent cough Gastrointestinal: negative for abdominal pain  OBJECTIVE Vitals: Temp 99.2 F (37.3 C) (Tympanic)   Ht 5' 3.5" (1.613 m)   Wt 209 lb (94.8 kg)   BMI 36.44 kg/m  General: no acute distress , A&Ox3, no respiratory distress, coughing  Leamon Arnt, MD

## 2019-02-23 DIAGNOSIS — F4323 Adjustment disorder with mixed anxiety and depressed mood: Secondary | ICD-10-CM | POA: Diagnosis not present

## 2019-02-24 MED FILL — GUAIATUSSIN AC LIQUID: 100-10 | 6 days supply | Qty: 120 | Fill #0

## 2019-03-02 ENCOUNTER — Ambulatory Visit (INDEPENDENT_AMBULATORY_CARE_PROVIDER_SITE_OTHER): Payer: 59 | Admitting: Psychology

## 2019-03-02 DIAGNOSIS — F411 Generalized anxiety disorder: Secondary | ICD-10-CM | POA: Diagnosis not present

## 2019-03-19 DIAGNOSIS — M545 Low back pain: Secondary | ICD-10-CM | POA: Diagnosis not present

## 2019-03-20 ENCOUNTER — Other Ambulatory Visit: Payer: Self-pay | Admitting: Family Medicine

## 2019-03-20 MED FILL — TRINTELLIX 10 MG TABLET: 10 | 30 days supply | Qty: 30 | Fill #0

## 2019-03-21 DIAGNOSIS — M545 Low back pain: Secondary | ICD-10-CM | POA: Diagnosis not present

## 2019-03-28 ENCOUNTER — Ambulatory Visit (INDEPENDENT_AMBULATORY_CARE_PROVIDER_SITE_OTHER): Payer: 59 | Admitting: Psychology

## 2019-03-28 DIAGNOSIS — F411 Generalized anxiety disorder: Secondary | ICD-10-CM | POA: Diagnosis not present

## 2019-03-30 DIAGNOSIS — M545 Low back pain: Secondary | ICD-10-CM | POA: Diagnosis not present

## 2019-04-05 DIAGNOSIS — F4323 Adjustment disorder with mixed anxiety and depressed mood: Secondary | ICD-10-CM | POA: Diagnosis not present

## 2019-04-06 DIAGNOSIS — M545 Low back pain: Secondary | ICD-10-CM | POA: Diagnosis not present

## 2019-04-11 ENCOUNTER — Other Ambulatory Visit: Payer: Self-pay | Admitting: Family Medicine

## 2019-04-11 MED FILL — PANTOPRAZOLE SOD DR 20 MG T: 20 | 90 days supply | Qty: 90 | Fill #0

## 2019-04-14 ENCOUNTER — Encounter: Payer: Self-pay | Admitting: Certified Nurse Midwife

## 2019-04-18 ENCOUNTER — Ambulatory Visit (INDEPENDENT_AMBULATORY_CARE_PROVIDER_SITE_OTHER): Payer: 59 | Admitting: Psychology

## 2019-04-18 DIAGNOSIS — F411 Generalized anxiety disorder: Secondary | ICD-10-CM | POA: Diagnosis not present

## 2019-04-19 MED FILL — TRINTELLIX 10 MG TABLET: 10 | 30 days supply | Qty: 30 | Fill #1

## 2019-04-20 DIAGNOSIS — M545 Low back pain: Secondary | ICD-10-CM | POA: Diagnosis not present

## 2019-04-25 DIAGNOSIS — F4323 Adjustment disorder with mixed anxiety and depressed mood: Secondary | ICD-10-CM | POA: Diagnosis not present

## 2019-05-09 ENCOUNTER — Ambulatory Visit (INDEPENDENT_AMBULATORY_CARE_PROVIDER_SITE_OTHER): Payer: 59 | Admitting: Psychology

## 2019-05-09 DIAGNOSIS — F411 Generalized anxiety disorder: Secondary | ICD-10-CM | POA: Diagnosis not present

## 2019-05-16 DIAGNOSIS — F4323 Adjustment disorder with mixed anxiety and depressed mood: Secondary | ICD-10-CM | POA: Diagnosis not present

## 2019-05-19 MED FILL — TRINTELLIX 10 MG TABLET: 10 | 30 days supply | Qty: 30 | Fill #2

## 2019-05-30 ENCOUNTER — Ambulatory Visit (INDEPENDENT_AMBULATORY_CARE_PROVIDER_SITE_OTHER): Payer: 59 | Admitting: Psychology

## 2019-05-30 DIAGNOSIS — F411 Generalized anxiety disorder: Secondary | ICD-10-CM | POA: Diagnosis not present

## 2019-06-01 MED FILL — clonazePAM 0.5 MG TABS: 0.5 | 20 days supply | Qty: 20 | Fill #1

## 2019-06-13 DIAGNOSIS — F4323 Adjustment disorder with mixed anxiety and depressed mood: Secondary | ICD-10-CM | POA: Diagnosis not present

## 2019-06-15 ENCOUNTER — Ambulatory Visit: Payer: 59 | Admitting: Psychology

## 2019-06-15 MED FILL — TRINTELLIX 10 MG TABLET: 10 | 30 days supply | Qty: 30 | Fill #3

## 2019-06-27 ENCOUNTER — Ambulatory Visit: Payer: 59 | Admitting: Psychology

## 2019-06-30 MED FILL — PANTOPRAZOLE SOD DR 20 MG T: 20 | 90 days supply | Qty: 90 | Fill #1

## 2019-06-30 MED FILL — HYDROCHLOROTHIAZIDE 25 MG T: 25 | 90 days supply | Qty: 90 | Fill #1

## 2019-07-14 ENCOUNTER — Other Ambulatory Visit: Payer: Self-pay

## 2019-07-14 ENCOUNTER — Ambulatory Visit
Admission: RE | Admit: 2019-07-14 | Discharge: 2019-07-14 | Disposition: A | Discharge status: Home / Self Care | Payer: 59 | Source: Ambulatory Visit | Attending: Certified Nurse Midwife | Admitting: Certified Nurse Midwife

## 2019-07-14 ENCOUNTER — Other Ambulatory Visit: Payer: Self-pay | Admitting: Obstetrics & Gynecology

## 2019-07-14 DIAGNOSIS — N6320 Unspecified lump in the left breast, unspecified quadrant: Secondary | ICD-10-CM

## 2019-07-14 DIAGNOSIS — R922 Inconclusive mammogram: Secondary | ICD-10-CM | POA: Diagnosis not present

## 2019-07-17 MED FILL — TRINTELLIX 10 MG TABLET: 10 | 30 days supply | Qty: 30 | Fill #4

## 2019-07-24 ENCOUNTER — Ambulatory Visit: Payer: 59 | Admitting: Psychology

## 2019-07-25 ENCOUNTER — Ambulatory Visit: Payer: 59 | Admitting: Psychology

## 2019-07-27 DIAGNOSIS — F4323 Adjustment disorder with mixed anxiety and depressed mood: Secondary | ICD-10-CM | POA: Diagnosis not present

## 2019-08-03 DIAGNOSIS — M545 Low back pain: Secondary | ICD-10-CM | POA: Diagnosis not present

## 2019-08-03 MED FILL — predniSONE 10 MG TABS: 10 | 6 days supply | Qty: 21 | Fill #0

## 2019-08-09 ENCOUNTER — Ambulatory Visit (INDEPENDENT_AMBULATORY_CARE_PROVIDER_SITE_OTHER): Payer: 59 | Admitting: Psychology

## 2019-08-09 DIAGNOSIS — F411 Generalized anxiety disorder: Secondary | ICD-10-CM | POA: Diagnosis not present

## 2019-08-14 MED FILL — TRINTELLIX 10 MG TABLET: 10 | 30 days supply | Qty: 30 | Fill #5

## 2019-08-30 ENCOUNTER — Ambulatory Visit (INDEPENDENT_AMBULATORY_CARE_PROVIDER_SITE_OTHER): Payer: 59 | Admitting: Psychology

## 2019-08-30 DIAGNOSIS — F411 Generalized anxiety disorder: Secondary | ICD-10-CM

## 2019-08-31 DIAGNOSIS — F4323 Adjustment disorder with mixed anxiety and depressed mood: Secondary | ICD-10-CM | POA: Diagnosis not present

## 2019-09-08 MED FILL — TRINTELLIX 10 MG TABLET: 10 | 30 days supply | Qty: 30 | Fill #6

## 2019-09-22 MED FILL — PANTOPRAZOLE SOD DR 20 MG T: 20 | 90 days supply | Qty: 90 | Fill #2

## 2019-09-29 ENCOUNTER — Ambulatory Visit: Payer: 59 | Admitting: Psychology

## 2019-10-12 MED FILL — TRINTELLIX 10 MG TABLET: 10 | 30 days supply | Qty: 30 | Fill #7

## 2019-10-20 ENCOUNTER — Ambulatory Visit: Payer: 59 | Admitting: Certified Nurse Midwife

## 2019-11-06 ENCOUNTER — Other Ambulatory Visit: Payer: Self-pay | Admitting: Family Medicine

## 2019-11-06 MED FILL — TRINTELLIX 10 MG TABLET: 10 | 90 days supply | Qty: 90 | Fill #0

## 2019-11-06 NOTE — Telephone Encounter (Signed)
Pt is overdue for visit for mood and cpe; please call and ask her to schedule. I've refilled trintellix for 90 days. Thanks.

## 2019-11-06 NOTE — Telephone Encounter (Signed)
LVM asking patient to call back.  

## 2019-11-24 ENCOUNTER — Encounter: Payer: Self-pay | Admitting: Family Medicine

## 2019-11-24 ENCOUNTER — Other Ambulatory Visit: Payer: Self-pay

## 2019-11-24 ENCOUNTER — Other Ambulatory Visit: Payer: Self-pay | Admitting: Family Medicine

## 2019-11-24 ENCOUNTER — Telehealth (INDEPENDENT_AMBULATORY_CARE_PROVIDER_SITE_OTHER): Payer: 59 | Admitting: Family Medicine

## 2019-11-24 DIAGNOSIS — K219 Gastro-esophageal reflux disease without esophagitis: Secondary | ICD-10-CM | POA: Diagnosis not present

## 2019-11-24 DIAGNOSIS — F411 Generalized anxiety disorder: Secondary | ICD-10-CM | POA: Diagnosis not present

## 2019-11-24 DIAGNOSIS — I10 Essential (primary) hypertension: Secondary | ICD-10-CM | POA: Diagnosis not present

## 2019-11-24 DIAGNOSIS — F330 Major depressive disorder, recurrent, mild: Secondary | ICD-10-CM

## 2019-11-24 MED ORDER — VORTIOXETINE HBR 10 MG PO TABS: 10.0000 mg | ORAL_TABLET | Freq: Every day | ORAL | 3 refills | Status: DC

## 2019-11-24 MED ORDER — HYDROCHLOROTHIAZIDE 25 MG PO TABS: 25.0000 mg | ORAL_TABLET | Freq: Every day | ORAL | 3 refills | Status: DC

## 2019-11-24 MED FILL — HYDROCHLOROTHIAZIDE 25 MG T: 25 | 90 days supply | Qty: 90 | Fill #0

## 2019-11-24 NOTE — Progress Notes (Signed)
Virtual Visit via Video Note  Subjective  CC:  Chief Complaint  Patient presents with  . Depression    feels well controlled, happy with current dose of Trintellix     I connected with Monica Benjamin on 11/24/19 at 11:00 AM EDT by a video enabled telemedicine application and verified that I am speaking with the correct person using two identifiers. Location patient: Home Location provider: [facility] [place], Office Persons participating in the virtual visit: [place], Leamon Arnt, MD Reymundo Poll CMA  I discussed the limitations of evaluation and management by telemedicine and the availability of in person appointments. The patient expressed understanding and agreed to proceed. HPI: Monica Benjamin is a 39 y.o. female who presents to the office today to address the problems listed above in the chief complaint.  Depression f/u: last visit was 02/2018. Needs refills. Mood and anxiety are very well controlled. Has been in psychotherapy for the last 2 years and it has made a tremendous difference. meds continue to control mood/anxiety. No AEs  Hypertension f/u: no longer taking lisinopril 5 daily. Reports cough. She is taking the hctz; home readings are variable but mostly controlled. Due lab work.  Pt reports she is doing well.  GERD is well controlled on chronic ppi/protonix.  HM: due visit but screens and imms are up to date.    Assessment  1. GAD (generalized anxiety disorder)   2. Mild episode of recurrent major depressive disorder ([name])   3. Essential hypertension   4. Gastroesophageal reflux disease without esophagitis      Plan    Hypertension f/u: BP control is fairly well controlled. Will check more readings and send to me. Will check labs with lab appointment w/in the next 2 weeks. Cont hctz. Consider adding arb or CCB if needed  GAD/depression: well controlled. Refilled trintellix. Continue therapy  GERD  controlled. Refilled meds.   rec cpe visit next year: expecting their 2nd child in January. Happy!  I discussed the assessment and treatment plan with the patient. The patient was provided an opportunity to ask questions and all were answered. The patient agreed with the plan and demonstrated an understanding of the instructions.   The patient was advised to call back or seek an in-person evaluation if the symptoms worsen or if the condition fails to improve as anticipated. Follow up: Return in about 6 months (around 05/24/2020) for complete physical.  Visit date not found  Meds ordered this encounter  Medications  . vortioxetine HBr (TRINTELLIX) 10 MG TABS tablet    Sig: Take 1 tablet (10 mg total) by mouth daily.    Dispense:  90 tablet    Refill:  3  . hydrochlorothiazide (HYDRODIURIL) 25 MG tablet    Sig: Take 1 tablet (25 mg total) by mouth daily.    Dispense:  90 tablet    Refill:  3    Please keep on file for next refill due December      I reviewed the patients updated PMH, FH, and SocHx.    Patient Active Problem List   Diagnosis Date Noted  . Essential hypertension 03/16/2018    Priority: High  . OSA on CPAP 05/25/2016    Priority: High  . Moderate persistent asthma without complication 67/59/1638    Priority: High  . Chronic seasonal allergic rhinitis due to pollen 11/25/2015    Priority: Medium  . GAD (generalized anxiety disorder) 11/25/2015  Priority: Medium  . Gastroesophageal reflux disease without esophagitis 11/25/2015    Priority: Medium  . Irritable bowel syndrome with both constipation and diarrhea 11/25/2015    Priority: Medium  . Mild episode of recurrent major depressive disorder ([name]) 05/10/2018  . Hypertriglyceridemia 05/10/2018  . Infertility, female 02/15/2017   Current Meds  Medication Sig  . albuterol (PROVENTIL HFA;VENTOLIN HFA) 108 (90 Base) MCG/ACT inhaler Inhale 2 puffs into the lungs every 4 (four) hours as needed for wheezing  (cough, shortness of breath or wheezing.).  Marland Kitchen budesonide-formoterol (SYMBICORT) 80-4.5 MCG/ACT inhaler Inhale 2 puffs into the lungs 2 (two) times daily.   . clonazePAM (KLONOPIN) 0.5 MG tablet TAKE 1/2-1 TABLET BY MOUTH DAILY AS NEEDED FOR ANXIETY.  Marland Kitchen loratadine (CLARITIN) 10 MG tablet Take 1 tablet by mouth daily.  . pantoprazole (PROTONIX) 20 MG tablet TAKE 1 TABLET BY MOUTH DAILY.  Marland Kitchen vortioxetine HBr (TRINTELLIX) 10 MG TABS tablet Take 1 tablet (10 mg total) by mouth daily.  . [DISCONTINUED] azithromycin (ZITHROMAX) 250 MG tablet Take 2 tabs today, then 1 tab daily for 4 days  . [DISCONTINUED] guaiFENesin-codeine 100-10 MG/5ML syrup Take 5 mLs by mouth every 6 (six) hours as needed for cough.  . [DISCONTINUED] hydrochlorothiazide (HYDRODIURIL) 25 MG tablet Take 1 tablet (25 mg total) by mouth daily.  . [DISCONTINUED] predniSONE (DELTASONE) 20 MG tablet Take 3 tabs daily for 5 days  . [DISCONTINUED] TRINTELLIX 10 MG TABS tablet TAKE 1 TABLET BY MOUTH ONCE A DAY  . hydrochlorothiazide (HYDRODIURIL) 25 MG tablet Take 1 tablet (25 mg total) by mouth daily.    Allergies: Patient is allergic to ace inhibitors. Family History: Patient family history includes COPD in her paternal grandmother; Dementia in her maternal grandmother; Diabetes in her maternal grandfather; Migraines in her mother and sister; Osteoporosis in her mother. Social History:  Patient  reports that she has never smoked. She has never used smokeless tobacco. She reports current alcohol use of about 2.0 - 3.0 standard drinks of alcohol per week. She reports that she does not use drugs.  Review of Systems: Constitutional: Negative for fever malaise or anorexia Cardiovascular: negative for chest pain Respiratory: negative for SOB or persistent cough Gastrointestinal: negative for abdominal pain  Lab Results  Component Value Date   CREATININE 0.82 03/16/2018   BUN 14 03/16/2018   NA 137 03/16/2018   K 3.5 03/16/2018   CL  97 03/16/2018   CO2 32 03/16/2018   Lab Results  Component Value Date   CHOL 214 (H) 03/16/2018   HDL 47.50 03/16/2018   LDLDIRECT 137.0 03/16/2018   TRIG 314.0 (H) 03/16/2018   CHOLHDL 5 03/16/2018    OBJECTIVE Vitals: There were no vitals taken for this visit. General: no acute distress , A&Ox3 Happy mood. Normal speech  Leamon Arnt, MD

## 2019-11-24 NOTE — Patient Instructions (Addendum)
Please return in 3-6 months for your annual complete physical; please come fasting.  My office will call you to schedule a lab appointment.  If you have any questions or concerns, please don't hesitate to send me a message via MyChart or call the office at [phone]. Thank you for visiting with [place] today! It's our pleasure caring for you.

## 2019-11-24 NOTE — Progress Notes (Signed)
Patient is scheduled for Wednesday at 815 am.

## 2019-11-29 ENCOUNTER — Other Ambulatory Visit: Payer: 59

## 2019-11-29 ENCOUNTER — Other Ambulatory Visit: Payer: Self-pay

## 2019-11-29 DIAGNOSIS — I10 Essential (primary) hypertension: Secondary | ICD-10-CM | POA: Diagnosis not present

## 2019-11-30 LAB — LIPID PANEL
Cholesterol: 212 mg/dL — ABNORMAL HIGH (ref <200)
HDL: 43 mg/dL — ABNORMAL LOW (ref >=50)
LDL Cholesterol (Calc): 121 mg/dL (calc) — ABNORMAL HIGH
Non-HDL Cholesterol (Calc): 169 mg/dL (calc) — ABNORMAL HIGH (ref <130)
Total CHOL/HDL Ratio: 4.9 (calc) (ref <5.0)
Triglycerides: 339 mg/dL — ABNORMAL HIGH (ref <150)

## 2019-11-30 LAB — CBC WITH DIFFERENTIAL/PLATELET
Absolute Monocytes: 678 cells/uL (ref 200–950)
Basophils Absolute: 68 cells/uL (ref 0–200)
Basophils Relative: 0.6 %
Eosinophils Absolute: 486 cells/uL (ref 15–500)
Eosinophils Relative: 4.3 %
HCT: 42 % (ref 35.0–45.0)
Hemoglobin: 14.5 g/dL (ref 11.7–15.5)
Lymphs Abs: 3673 cells/uL (ref 850–3900)
MCH: 30.8 pg (ref 27.0–33.0)
MCHC: 34.5 g/dL (ref 32.0–36.0)
MCV: 89.2 fL (ref 80.0–100.0)
MPV: 10.5 fL (ref 7.5–12.5)
Monocytes Relative: 6 %
Neutro Abs: 6396 cells/uL (ref 1500–7800)
Neutrophils Relative %: 56.6 %
Platelets: 312 10*3/uL (ref 140–400)
RBC: 4.71 10*6/uL (ref 3.80–5.10)
RDW: 12.4 % (ref 11.0–15.0)
Total Lymphocyte: 32.5 %
WBC: 11.3 10*3/uL — ABNORMAL HIGH (ref 3.8–10.8)

## 2019-11-30 LAB — COMPLETE METABOLIC PANEL WITH GFR
AG Ratio: 1.5 (calc) (ref 1.0–2.5)
ALT: 11 U/L (ref 6–29)
AST: 12 U/L (ref 10–30)
Albumin: 4.4 g/dL (ref 3.6–5.1)
Alkaline phosphatase (APISO): 97 U/L (ref 31–125)
BUN: 11 mg/dL (ref 7–25)
CO2: 31 mmol/L (ref 20–32)
Calcium: 10 mg/dL (ref 8.6–10.2)
Chloride: 98 mmol/L (ref 98–110)
Creat: 0.84 mg/dL (ref 0.50–1.10)
GFR, Est African American: 100 mL/min/{1.73_m2} (ref >=60)
GFR, Est Non African American: 86 mL/min/{1.73_m2} (ref >=60)
Globulin: 2.9 g/dL (calc) (ref 1.9–3.7)
Glucose, Bld: 105 mg/dL — ABNORMAL HIGH (ref 65–99)
Potassium: 3.9 mmol/L (ref 3.5–5.3)
Sodium: 137 mmol/L (ref 135–146)
Total Bilirubin: 0.5 mg/dL (ref 0.2–1.2)
Total Protein: 7.3 g/dL (ref 6.1–8.1)

## 2019-11-30 NOTE — Progress Notes (Signed)
Please call patient: I have reviewed his/her lab results. Was she fasting? TRIGLYCERIDES remain very high; rec fish oil 1gm bid otc and recheck at physical. Needs low fat diet. Other labs are fine . If fasting, sugar was just above normal as well. Need f/u blood pressure readings as well. Thanks

## 2019-12-06 MED FILL — PANTOPRAZOLE SOD DR 20 MG T: 20 | 90 days supply | Qty: 90 | Fill #3

## 2019-12-19 ENCOUNTER — Encounter: Payer: Self-pay | Admitting: Family Medicine

## 2020-01-09 ENCOUNTER — Other Ambulatory Visit: Payer: Self-pay | Admitting: Family Medicine

## 2020-01-09 MED FILL — clonazePAM 0.5 MG TABS: 0.5 | 20 days supply | Qty: 20 | Fill #0

## 2020-01-09 NOTE — Telephone Encounter (Signed)
Pt requesting Clonazepam 0.5 mg tab LOV: 11/24/2019 No visits scheduled Lat refill: 06/01/2019  Approve?

## 2020-02-01 DIAGNOSIS — F331 Major depressive disorder, recurrent, moderate: Secondary | ICD-10-CM | POA: Diagnosis not present

## 2020-02-06 MED FILL — TRINTELLIX 10 MG TABLET: 10 | 90 days supply | Qty: 90 | Fill #0

## 2020-02-09 DIAGNOSIS — F331 Major depressive disorder, recurrent, moderate: Secondary | ICD-10-CM | POA: Diagnosis not present

## 2020-02-21 ENCOUNTER — Ambulatory Visit
Admission: RE | Admit: 2020-02-21 | Discharge: 2020-02-21 | Disposition: A | Discharge status: Home / Self Care | Payer: 59 | Source: Ambulatory Visit | Attending: Obstetrics & Gynecology | Admitting: Obstetrics & Gynecology

## 2020-02-21 ENCOUNTER — Other Ambulatory Visit: Payer: Self-pay

## 2020-02-21 DIAGNOSIS — R922 Inconclusive mammogram: Secondary | ICD-10-CM | POA: Diagnosis not present

## 2020-02-21 DIAGNOSIS — N6320 Unspecified lump in the left breast, unspecified quadrant: Secondary | ICD-10-CM

## 2020-03-01 ENCOUNTER — Other Ambulatory Visit: Payer: Self-pay | Admitting: Family Medicine

## 2020-03-01 MED FILL — PANTOPRAZOLE SOD DR 20 MG T: 20 | 90 days supply | Qty: 90 | Fill #0

## 2020-03-07 ENCOUNTER — Ambulatory Visit: Payer: 59 | Admitting: Obstetrics and Gynecology

## 2020-04-01 ENCOUNTER — Encounter: Payer: Self-pay | Admitting: Obstetrics and Gynecology

## 2020-04-01 ENCOUNTER — Ambulatory Visit (INDEPENDENT_AMBULATORY_CARE_PROVIDER_SITE_OTHER): Payer: 59 | Admitting: Obstetrics and Gynecology

## 2020-04-01 ENCOUNTER — Other Ambulatory Visit: Payer: Self-pay

## 2020-04-01 ENCOUNTER — Other Ambulatory Visit (HOSPITAL_COMMUNITY)
Admission: RE | Admit: 2020-04-01 | Discharge: 2020-04-01 | Disposition: A | Discharge status: Home / Self Care | Payer: 59 | Source: Ambulatory Visit | Attending: Obstetrics and Gynecology | Admitting: Obstetrics and Gynecology

## 2020-04-01 VITALS — BP 118/78 | HR 87 | Ht 63.5 in | Wt 213.0 lb

## 2020-04-01 DIAGNOSIS — Z01419 Encounter for gynecological examination (general) (routine) without abnormal findings: Secondary | ICD-10-CM | POA: Insufficient documentation

## 2020-04-01 NOTE — Progress Notes (Signed)
40 y.o. [id number] Married Caucasian female here for annual exam.    Menses monthly and last 2 - 3 days.  Some ovulation cramping.  Takes Tylenol and uses a heating pad.  Has hormonal headaches.  Symptoms are manageable.   Reports elevated cholesterol.  Son is 2 months old and doing well.  Also has a 42 yo adopted girl.  Works for [employer].    PCP:   Billey Chang, MD  Patient's last menstrual period was 03/30/2020.           Sexually active: Yes.    The current method of family planning is female partner.    Exercising: Yes.    twice weekly Smoker:  no  Health Maintenance: Pap:  10/17/18 Neg   05/27/15 Neg:Neg HR HPV History of abnormal Pap:  no MMG:  Dx bilateral mammogram - 02/21/20 BIRADS 3:Probably benign, probable benign hamartoma of left breast, cat C density. TDaP:  10/31/13 Gardasil:   Completed series HIV: 2017 negative Hep C:  Screening Labs:  PCP   reports that she has never smoked. She has never used smokeless tobacco. She reports current alcohol use of about 2.0 - 3.0 standard drinks of alcohol per week. She reports that she does not use drugs.  Past Medical History:  Diagnosis Date  . Allergy   . Anxiety   . Asthma   . Depression   . Hypertension   . IBS (irritable bowel syndrome)   . Infertility, female 02/15/2017   IVF treatment  . Migraines   . Trigeminal neuralgia     Past Surgical History:  Procedure Laterality Date  . WISDOM TOOTH EXTRACTION      Current Outpatient Medications  Medication Sig Dispense Refill  . albuterol (PROVENTIL HFA;VENTOLIN HFA) 108 (90 Base) MCG/ACT inhaler Inhale 2 puffs into the lungs every 4 (four) hours as needed for wheezing (cough, shortness of breath or wheezing.). 1 Inhaler 1  . clonazePAM (KLONOPIN) 0.5 MG tablet TAKE 1/2 TO 1 TABLET BY MOUTH DAILY AS NEEDED FOR ANXIETY. 20 tablet 1  . hydrochlorothiazide (HYDRODIURIL) 25 MG tablet Take 1 tablet (25 mg total) by mouth daily. 90 tablet 3  . pantoprazole (PROTONIX)  20 MG tablet TAKE 1 TABLET BY MOUTH DAILY. 90 tablet 3  . vortioxetine HBr (TRINTELLIX) 10 MG TABS tablet Take 1 tablet (10 mg total) by mouth daily. 90 tablet 3   No current facility-administered medications for this visit.    Family History  Problem Relation Age of Onset  . Osteoporosis Mother   . Migraines Mother   . Migraines Sister   . Dementia Maternal Grandmother   . Diabetes Maternal Grandfather   . COPD Paternal Grandmother   . Cancer Neg Hx   . Heart disease Neg Hx   . Hypertension Neg Hx     Review of Systems  Constitutional: Negative.   HENT: Negative.   Eyes: Negative.   Respiratory: Negative.   Cardiovascular: Negative.   Gastrointestinal: Negative.   Endocrine: Negative.   Genitourinary: Negative.   Musculoskeletal: Negative.   Skin: Negative.   Allergic/Immunologic: Negative.   Neurological: Negative.   Hematological: Negative.   Psychiatric/Behavioral: Negative.     Exam:   BP 118/78   Pulse 87   Ht 5' 3.5" (1.613 m)   Wt 213 lb (96.6 kg)   LMP 03/30/2020   SpO2 97%   BMI 37.14 kg/m     General appearance: alert, cooperative and appears stated age Head: normocephalic, without obvious abnormality, atraumatic  Neck: no adenopathy, supple, symmetrical, trachea midline and thyroid normal to inspection and palpation Lungs: clear to auscultation bilaterally Breasts: normal appearance, no masses or tenderness, No nipple retraction or dimpling, No nipple discharge or bleeding, No axillary adenopathy Heart: regular rate and rhythm Abdomen: soft, non-tender; no masses, no organomegaly Extremities: extremities normal, atraumatic, no cyanosis or edema Skin: skin color, texture, turgor normal. No rashes or lesions Lymph nodes: cervical, supraclavicular, and axillary nodes normal. Neurologic: grossly normal  Pelvic: External genitalia:  no lesions              No abnormal inguinal nodes palpated.              Urethra:  normal appearing urethra with no  masses, tenderness or lesions              Bartholins and Skenes: normal                 Vagina: normal appearing vagina with normal color and discharge, no lesions              Cervix: no lesions              Pap taken: Yes.   Bimanual Exam:  Uterus:  normal size, contour, position, consistency, mobility, non-tender              Adnexa: no mass, fullness, tenderness              Rectal exam: Yes.    Confirms.              Anus:  normal sphincter tone, no lesions  Chaperone was present for exam.  Assessment:   Well woman visit with normal exam. Probable left breast benign hamartoma.  Followed with imaging. No prior biopsy.  Hypercholesterolemia and hypertriglyceridemia.   Plan: Mammogram dx in January, 2023.   Self breast awareness reviewed. Pap and HR HPV as above. Guidelines for Calcium, Vitamin D, regular exercise program including cardiovascular and weight bearing exercise. We reviewed a Mediterranean heart healthy diet.  Labs with PCP.  Follow up annually and prn.

## 2020-04-01 NOTE — Patient Instructions (Signed)
EXERCISE AND DIET:  We recommended that you start or continue a regular exercise program for good health. Regular exercise means any activity that makes your heart beat faster and makes you sweat.  We recommend exercising at least 30 minutes per day at least 3 days a week, preferably 4 or 5.  We also recommend a diet low in fat and sugar.  Inactivity, poor dietary choices and obesity can cause diabetes, heart attack, stroke, and kidney damage, among others.    ALCOHOL AND SMOKING:  Women should limit their alcohol intake to no more than 7 drinks/beers/glasses of wine (combined, not each!) per week. Moderation of alcohol intake to this level decreases your risk of breast cancer and liver damage. And of course, no recreational drugs are part of a healthy lifestyle.  And absolutely no smoking or even second hand smoke. Most people know smoking can cause heart and lung diseases, but did you know it also contributes to weakening of your bones? Aging of your skin?  Yellowing of your teeth and nails?  CALCIUM AND VITAMIN D:  Adequate intake of calcium and Vitamin D are recommended.  The recommendations for exact amounts of these supplements seem to change often, but generally speaking 600 mg of calcium (either carbonate or citrate) and 800 units of Vitamin D per day seems prudent. Certain women may benefit from higher intake of Vitamin D.  If you are among these women, your doctor will have told you during your visit.    PAP SMEARS:  Pap smears, to check for cervical cancer or precancers,  have traditionally been done yearly, although recent scientific advances have shown that most women can have pap smears less often.  However, every woman still should have a physical exam from her gynecologist every year. It will include a breast check, inspection of the vulva and vagina to check for abnormal growths or skin changes, a visual exam of the cervix, and then an exam to evaluate the size and shape of the uterus and  ovaries.  And after 42 years of age, a rectal exam is indicated to check for rectal cancers. We will also provide age appropriate advice regarding health maintenance, like when you should have certain vaccines, screening for sexually transmitted diseases, bone density testing, colonoscopy, mammograms, etc.   MAMMOGRAMS:  All women over 40 years old should have a yearly mammogram. Many facilities now offer a "3D" mammogram, which may cost around $50 extra out of pocket. If possible,  we recommend you accept the option to have the 3D mammogram performed.  It both reduces the number of women who will be called back for extra views which then turn out to be normal, and it is better than the routine mammogram at detecting truly abnormal areas.    COLONOSCOPY:  Colonoscopy to screen for colon cancer is recommended for all women at age 50.  We know, you hate the idea of the prep.  We agree, BUT, having colon cancer and not knowing it is worse!!  Colon cancer so often starts as a polyp that can be seen and removed at colonscopy, which can quite literally save your life!  And if your first colonoscopy is normal and you have no family history of colon cancer, most women don't have to have it again for 10 years.  Once every ten years, you can do something that may end up saving your life, right?  We will be happy to help you get it scheduled when you are ready.    Be sure to check your insurance coverage so you understand how much it will cost.  It may be covered as a preventative service at no cost, but you should check your particular policy.      Mediterranean Diet A Mediterranean diet refers to food and lifestyle choices that are based on the traditions of countries located on the [employer]. This way of eating has been shown to help prevent certain conditions and improve outcomes for people who have chronic diseases, like kidney disease and heart disease. What are tips for following this  plan? Lifestyle  Cook and eat meals together with your family, when possible.  Drink enough fluid to keep your urine clear or pale yellow.  Be physically active every day. This includes: ? Aerobic exercise like running or swimming. ? Leisure activities like gardening, walking, or housework.  Get 7-8 hours of sleep each night.  If recommended by your health care provider, drink red wine in moderation. This means 1 glass a day for nonpregnant women and 2 glasses a day for men. A glass of wine equals 5 oz (150 mL). Reading food labels  Check the serving size of packaged foods. For foods such as rice and pasta, the serving size refers to the amount of cooked product, not dry.  Check the total fat in packaged foods. Avoid foods that have saturated fat or trans fats.  Check the ingredients list for added sugars, such as corn syrup.   Shopping  At the grocery store, buy most of your food from the areas near the walls of the store. This includes: ? Fresh fruits and vegetables (produce). ? Grains, beans, nuts, and seeds. Some of these may be available in unpackaged forms or large amounts (in bulk). ? Fresh seafood. ? Poultry and eggs. ? Low-fat dairy products.  Buy whole ingredients instead of prepackaged foods.  Buy fresh fruits and vegetables in-season from local farmers markets.  Buy frozen fruits and vegetables in resealable bags.  If you do not have access to quality fresh seafood, buy precooked frozen shrimp or canned fish, such as tuna, salmon, or sardines.  Buy small amounts of raw or cooked vegetables, salads, or olives from the deli or salad bar at your store.  Stock your pantry so you always have certain foods on hand, such as olive oil, canned tuna, canned tomatoes, rice, pasta, and beans. Cooking  Cook foods with extra-virgin olive oil instead of using butter or other vegetable oils.  Have meat as a side dish, and have vegetables or grains as your main dish. This  means having meat in small portions or adding small amounts of meat to foods like pasta or stew.  Use beans or vegetables instead of meat in common dishes like chili or lasagna.  Experiment with different cooking methods. Try roasting or broiling vegetables instead of steaming or sauteing them.  Add frozen vegetables to soups, stews, pasta, or rice.  Add nuts or seeds for added healthy fat at each meal. You can add these to yogurt, salads, or vegetable dishes.  Marinate fish or vegetables using olive oil, lemon juice, garlic, and fresh herbs. Meal planning  Plan to eat 1 vegetarian meal one day each week. Try to work up to 2 vegetarian meals, if possible.  Eat seafood 2 or more times a week.  Have healthy snacks readily available, such as: ? Vegetable sticks with hummus. ? [place] yogurt. ? Fruit and nut trail mix.  Eat balanced meals throughout the week. This  includes: ? Fruit: 2-3 servings a day ? Vegetables: 4-5 servings a day ? Low-fat dairy: 2 servings a day ? Fish, poultry, or lean meat: 1 serving a day ? Beans and legumes: 2 or more servings a week ? Nuts and seeds: 1-2 servings a day ? Whole grains: 6-8 servings a day ? Extra-virgin olive oil: 3-4 servings a day  Limit red meat and sweets to only a few servings a month   What are my food choices?  Mediterranean diet ? Recommended  Grains: Whole-grain pasta. Brown rice. Bulgar wheat. Polenta. Couscous. Whole-wheat bread. [place].  Vegetables: Artichokes. Beets. Broccoli. Cabbage. Carrots. Eggplant. Green beans. Chard. Kale. Spinach. Onions. Leeks. Peas. Squash. Tomatoes. Peppers. Radishes.  Fruits: Apples. Apricots. Avocado. Berries. Bananas. Cherries. Dates. Figs. Grapes. Lemons. Melon. Oranges. Peaches. Plums. Pomegranate.  Meats and other protein foods: Beans. Almonds. Sunflower seeds. Pine nuts. Peanuts. [place]. Salmon. Scallops. Shrimp. [place]. Tilapia. Clams. Oysters. Eggs.  Dairy: Low-fat milk. Cheese.  Greek yogurt.  Beverages: Water. Red wine. Herbal tea.  Fats and oils: Extra virgin olive oil. Avocado oil. Grape seed oil.  Sweets and desserts: [place] yogurt with honey. Baked apples. Poached pears. Trail mix.  Seasoning and other foods: Basil. Cilantro. Coriander. Cumin. Mint. Parsley. Sage. Rosemary. Tarragon. Garlic. Oregano. Thyme. Pepper. Balsalmic vinegar. Tahini. Hummus. Tomato sauce. Olives. Mushrooms. ? Limit these  Grains: Prepackaged pasta or rice dishes. Prepackaged cereal with added sugar.  Vegetables: Deep fried potatoes (french fries).  Fruits: Fruit canned in syrup.  Meats and other protein foods: Beef. Pork. Lamb. Poultry with skin. Hot dogs. [place] Salines.  Dairy: Ice cream. Sour cream. Whole milk.  Beverages: Juice. Sugar-sweetened soft drinks. Beer. Liquor and spirits.  Fats and oils: Butter. Canola oil. Vegetable oil. Beef fat (tallow). Lard.  Sweets and desserts: Cookies. Cakes. Pies. Candy.  Seasoning and other foods: Mayonnaise. Premade sauces and marinades. The items listed may not be a complete list. Talk with your dietitian about what dietary choices are right for you. Summary  The Mediterranean diet includes both food and lifestyle choices.  Eat a variety of fresh fruits and vegetables, beans, nuts, seeds, and whole grains.  Limit the amount of red meat and sweets that you eat.  Talk with your health care provider about whether it is safe for you to drink red wine in moderation. This means 1 glass a day for nonpregnant women and 2 glasses a day for men. A glass of wine equals 5 oz (150 mL). This information is not intended to replace advice given to you by your health care provider. Make sure you discuss any questions you have with your health care provider. Document Revised: 09/12/2015 Document Reviewed: 09/05/2015 Elsevier Patient Education  [place].

## 2020-04-03 LAB — CYTOLOGY - PAP
Comment: NEGATIVE
Diagnosis: NEGATIVE
High risk HPV: NEGATIVE

## 2020-05-07 MED FILL — Vortioxetine HBr Tab 10 MG (Base Equiv): ORAL | 90 days supply | Qty: 90 | Fill #0 | Status: AC

## 2020-05-08 ENCOUNTER — Other Ambulatory Visit (HOSPITAL_COMMUNITY): Payer: Self-pay

## 2020-05-19 MED FILL — Pantoprazole Sodium EC Tab 20 MG (Base Equiv): ORAL | 90 days supply | Qty: 90 | Fill #0 | Status: AC

## 2020-05-20 ENCOUNTER — Other Ambulatory Visit (HOSPITAL_COMMUNITY): Payer: Self-pay

## 2020-05-22 ENCOUNTER — Other Ambulatory Visit (HOSPITAL_COMMUNITY): Payer: Self-pay

## 2020-05-22 MED FILL — Clonazepam Tab 0.5 MG: ORAL | 20 days supply | Qty: 20 | Fill #0 | Status: AC

## 2020-05-29 ENCOUNTER — Other Ambulatory Visit (HOSPITAL_COMMUNITY): Payer: Self-pay

## 2020-05-29 ENCOUNTER — Telehealth: Payer: 59 | Admitting: Nurse Practitioner

## 2020-05-29 DIAGNOSIS — R062 Wheezing: Secondary | ICD-10-CM

## 2020-05-29 DIAGNOSIS — J4521 Mild intermittent asthma with (acute) exacerbation: Secondary | ICD-10-CM | POA: Diagnosis not present

## 2020-05-29 MED ORDER — AZITHROMYCIN 250 MG PO TABS
ORAL_TABLET | Freq: Every day | ORAL | 0 refills | Status: DC
  Filled 2020-05-29: qty 6, 5d supply, fill #0

## 2020-05-29 MED ORDER — PREDNISONE 20 MG PO TABS
40.0000 mg | ORAL_TABLET | Freq: Every day | ORAL | 0 refills | Status: AC
  Filled 2020-05-29: qty 10, 5d supply, fill #0

## 2020-05-29 MED ORDER — ALBUTEROL SULFATE HFA 108 (90 BASE) MCG/ACT IN AERS
2.0000 | INHALATION_SPRAY | RESPIRATORY_TRACT | 1 refills | Status: DC | PRN
  Filled 2020-05-29: qty 18, 17d supply, fill #0
  Filled 2020-09-16: qty 18, 17d supply, fill #1

## 2020-05-29 NOTE — Progress Notes (Signed)
We are sorry that you are not feeling well.  Here is how we plan to help!  Based on your presentation I believe you most likely have A cough due to bacteria.  When patients have a fever and a productive cough with a change in color or increased sputum production, we are concerned about bacterial bronchitis.  If left untreated it can progress to pneumonia.  If your symptoms do not improve with your treatment plan it is important that you contact your provider.   I have prescribed Azithromyin 250 mg: two tablets now and then one tablet daily for 4 additonal days    In addition you may use A non-prescription cough medication called Mucinex DM: take 2 tablets every 12 hours.  Prednisone 20mg  2 po at same time daily for 5 days and albuterol hfa 2 puffs every 6 hours as needed.  From your responses in the eVisit questionnaire you describe inflammation in the upper respiratory tract which is causing a significant cough.  This is commonly called Bronchitis and has four common causes:    Allergies  Viral Infections  Acid Reflux  Bacterial Infection Allergies, viruses and acid reflux are treated by controlling symptoms or eliminating the cause. An example might be a cough caused by taking certain blood pressure medications. You stop the cough by changing the medication. Another example might be a cough caused by acid reflux. Controlling the reflux helps control the cough.  USE OF BRONCHODILATOR ("RESCUE") INHALERS: There is a risk from using your bronchodilator too frequently.  The risk is that over-reliance on a medication which only relaxes the muscles surrounding the breathing tubes can reduce the effectiveness of medications prescribed to reduce swelling and congestion of the tubes themselves.  Although you feel brief relief from the bronchodilator inhaler, your asthma may actually be worsening with the tubes becoming more swollen and filled with mucus.  This can delay other crucial treatments, such as  oral steroid medications. If you need to use a bronchodilator inhaler daily, several times per day, you should discuss this with your provider.  There are probably better treatments that could be used to keep your asthma under control.     HOME CARE . Only take medications as instructed by your medical team. . Complete the entire course of an antibiotic. . Drink plenty of fluids and get plenty of rest. . Avoid close contacts especially the very young and the elderly . Cover your mouth if you cough or cough into your sleeve. . Always remember to wash your hands . A steam or ultrasonic humidifier can help congestion.   GET HELP RIGHT AWAY IF: . You develop worsening fever. . You become short of breath . You cough up blood. . Your symptoms persist after you have completed your treatment plan MAKE SURE YOU   Understand these instructions.  Will watch your condition.  Will get help right away if you are not doing well or get worse.  Your e-visit answers were reviewed by a board certified advanced clinical practitioner to complete your personal care plan.  Depending on the condition, your plan could have included both over the counter or prescription medications. If there is a problem please reply  once you have received a response from your provider. Your safety is important to [place].  If you have drug allergies check your prescription carefully.    You can use MyChart to ask questions about today's visit, request a non-urgent call back, or ask for a work or  school excuse for 24 hours related to this e-Visit. If it has been greater than 24 hours you will need to follow up with your provider, or enter a new e-Visit to address those concerns. You will get an e-mail in the next two days asking about your experience.  I hope that your e-visit has been valuable and will speed your recovery. Thank you for using e-visits.  5-10 minutes spent reviewing and documenting in chart.

## 2020-05-31 ENCOUNTER — Other Ambulatory Visit (HOSPITAL_COMMUNITY): Payer: Self-pay

## 2020-07-10 ENCOUNTER — Other Ambulatory Visit: Payer: Self-pay | Admitting: Family Medicine

## 2020-07-10 ENCOUNTER — Other Ambulatory Visit (HOSPITAL_COMMUNITY): Payer: Self-pay

## 2020-07-10 MED ORDER — CLONAZEPAM 0.5 MG PO TABS
0.2500 mg | ORAL_TABLET | Freq: Every day | ORAL | 1 refills | Status: DC
  Filled 2020-07-10: qty 20, 20d supply, fill #0

## 2020-07-10 NOTE — Telephone Encounter (Signed)
Last refill 01/09/2020 Last OV 11/21/2019 dx anxiety

## 2020-07-11 ENCOUNTER — Other Ambulatory Visit (HOSPITAL_COMMUNITY): Payer: Self-pay

## 2020-07-17 ENCOUNTER — Other Ambulatory Visit (HOSPITAL_COMMUNITY): Payer: Self-pay

## 2020-07-17 MED FILL — Hydrochlorothiazide Tab 25 MG: ORAL | 90 days supply | Qty: 90 | Fill #0 | Status: AC

## 2020-08-02 MED FILL — Vortioxetine HBr Tab 10 MG (Base Equiv): ORAL | 90 days supply | Qty: 90 | Fill #1 | Status: AC

## 2020-08-05 ENCOUNTER — Other Ambulatory Visit (HOSPITAL_COMMUNITY): Payer: Self-pay

## 2020-08-09 ENCOUNTER — Other Ambulatory Visit (HOSPITAL_COMMUNITY): Payer: Self-pay

## 2020-08-09 MED FILL — Pantoprazole Sodium EC Tab 20 MG (Base Equiv): ORAL | 90 days supply | Qty: 90 | Fill #1 | Status: AC

## 2020-08-23 ENCOUNTER — Telehealth: Payer: 59 | Admitting: Emergency Medicine

## 2020-08-23 ENCOUNTER — Other Ambulatory Visit (HOSPITAL_COMMUNITY): Payer: Self-pay

## 2020-08-23 DIAGNOSIS — J329 Chronic sinusitis, unspecified: Secondary | ICD-10-CM | POA: Diagnosis not present

## 2020-08-23 MED ORDER — AMOXICILLIN-POT CLAVULANATE 875-125 MG PO TABS
1.0000 | ORAL_TABLET | Freq: Two times a day (BID) | ORAL | 0 refills | Status: DC
  Filled 2020-08-23: qty 14, 7d supply, fill #0

## 2020-08-23 NOTE — Progress Notes (Signed)

## 2020-09-17 ENCOUNTER — Other Ambulatory Visit (HOSPITAL_COMMUNITY): Payer: Self-pay

## 2020-09-21 DIAGNOSIS — J320 Chronic maxillary sinusitis: Secondary | ICD-10-CM | POA: Diagnosis not present

## 2020-10-01 ENCOUNTER — Encounter: Payer: Self-pay | Admitting: Physician Assistant

## 2020-10-01 ENCOUNTER — Other Ambulatory Visit: Payer: Self-pay

## 2020-10-01 ENCOUNTER — Ambulatory Visit: Payer: 59 | Admitting: Physician Assistant

## 2020-10-01 ENCOUNTER — Other Ambulatory Visit (HOSPITAL_COMMUNITY): Payer: Self-pay

## 2020-10-01 VITALS — BP 119/83 | HR 69 | Temp 98.0°F | Ht 63.5 in | Wt 211.2 lb

## 2020-10-01 DIAGNOSIS — R062 Wheezing: Secondary | ICD-10-CM | POA: Diagnosis not present

## 2020-10-01 DIAGNOSIS — I10 Essential (primary) hypertension: Secondary | ICD-10-CM

## 2020-10-01 DIAGNOSIS — J4540 Moderate persistent asthma, uncomplicated: Secondary | ICD-10-CM | POA: Diagnosis not present

## 2020-10-01 MED ORDER — BUDESONIDE-FORMOTEROL FUMARATE 80-4.5 MCG/ACT IN AERO
2.0000 | INHALATION_SPRAY | Freq: Two times a day (BID) | RESPIRATORY_TRACT | 3 refills | Status: DC
  Filled 2020-10-01: qty 10.2, 30d supply, fill #0

## 2020-10-01 MED ORDER — LEVOCETIRIZINE DIHYDROCHLORIDE 5 MG PO TABS
5.0000 mg | ORAL_TABLET | Freq: Every evening | ORAL | 1 refills | Status: DC
  Filled 2020-10-01: qty 90, 90d supply, fill #0

## 2020-10-01 MED ORDER — HYDROCHLOROTHIAZIDE 25 MG PO TABS
25.0000 mg | ORAL_TABLET | Freq: Every day | ORAL | 3 refills | Status: DC
  Filled 2020-10-01: qty 90, 90d supply, fill #0
  Filled 2021-06-25: qty 90, 90d supply, fill #1
  Filled 2021-09-28: qty 90, 90d supply, fill #2

## 2020-10-01 NOTE — Progress Notes (Signed)
Acute Office Visit  Subjective:    Patient ID: Monica Benjamin, female    DOB: 1980-03-13, 40 y.o.   MRN: [id number]  Chief Complaint  Patient presents with   Hypertension   Wheezing    Hypertension  Wheezing   Patient is in today for recurrent sinus infections and issues with her asthma. Feels like she has been sick about 6 weeks. States this is affecting her breathing and airways. She recently had augmentin, but has had continued coughing and wheezing[place] she had a steroid injection & another antibiotic (Omnicef - couldn't tolerate). Feels like she had the most benefit from the steroid injection. Switched from Claritin to Zyrtec without much relief. She has been using her albuterol as needed. Previously on Symbicort.   Also needs refill on BP medication. States her pressures have been running around 120/80. No issues.   Past Medical History:  Diagnosis Date   Allergy    Anxiety    Asthma    Depression    Hypertension    IBS (irritable bowel syndrome)    Infertility, female 02/15/2017   IVF treatment   Migraines    Trigeminal neuralgia     Past Surgical History:  Procedure Laterality Date   WISDOM TOOTH EXTRACTION      Family History  Problem Relation Age of Onset   Osteoporosis Mother    Migraines Mother    Migraines Sister    Dementia Maternal Grandmother    Diabetes Maternal Grandfather    COPD Paternal Grandmother    Cancer Neg Hx    Heart disease Neg Hx    Hypertension Neg Hx     Social History   Socioeconomic History   Marital status: Married    Spouse name: Museum/gallery conservator   Number of children: Not on file   Years of education: Not on file   Highest education level: Not on file  Occupational History   Occupation: Editor, commissioning    Employer: [employer]  Tobacco Use   Smoking status: Never   Smokeless tobacco: Never  Vaping Use   Vaping Use: Never used  Substance and Sexual Activity   Alcohol use: Yes     Alcohol/week: 2.0 - 3.0 standard drinks    Types: 2 - 3 Standard drinks or equivalent per week   Drug use: No   Sexual activity: Yes    Partners: Female    Birth control/protection: None  Other Topics Concern   Not on file  Social History Narrative   Married to [employer]   Social Determinants of Health   Financial Resource Strain: Not on file  Food Insecurity: Not on file  Transportation Needs: Not on file  Physical Activity: Not on file  Stress: Not on file  Social Connections: Not on file  Intimate Partner Violence: Not on file    Outpatient Medications Prior to Visit  Medication Sig Dispense Refill   albuterol (VENTOLIN HFA) 108 (90 Base) MCG/ACT inhaler Inhale 2 puffs into the lungs every 4 (four) hours as needed (cough, shortness of breath or wheezing.). 18 g 1   clonazePAM (KLONOPIN) 0.5 MG tablet Take 0.5-1 tablets (0.25-0.5 mg total) by mouth daily as needed for anxiety 20 tablet 1   hydrochlorothiazide (HYDRODIURIL) 25 MG tablet TAKE 1 TABLET BY MOUTH ONCE DAILY. 90 tablet 3   pantoprazole (PROTONIX) 20 MG tablet TAKE 1 TABLET BY MOUTH DAILY. 90 tablet 3   vortioxetine HBr (TRINTELLIX) 10 MG TABS tablet  TAKE 1 TABLET (10 MG TOTAL) BY MOUTH DAILY. 90 tablet 3   amoxicillin-clavulanate (AUGMENTIN) 875-125 MG tablet Take 1 tablet by mouth every 12 (twelve) hours. 14 tablet 0   azithromycin (ZITHROMAX Z-PAK) 250 MG tablet Take 2 tablets by mouth on day 1 then 1 tab daily for the next 4 days 6 tablet 0   No facility-administered medications prior to visit.    Allergies  Allergen Reactions   Ace Inhibitors Cough   Nsaids Swelling    Lips and face Other reaction(s): Unknown    Review of Systems  Respiratory:  Positive for wheezing.   REFER TO HPI FOR PERTINENT POSITIVES AND NEGATIVES     Objective:    Physical Exam Vitals and nursing note reviewed.  Constitutional:      Appearance: Normal appearance. She is normal weight. She is not toxic-appearing.  HENT:      Head: Normocephalic and atraumatic.     Right Ear: Tympanic membrane, ear canal and external ear normal.     Left Ear: Tympanic membrane, ear canal and external ear normal.     Nose: Nose normal.     Mouth/Throat:     Mouth: Mucous membranes are moist.  Eyes:     Extraocular Movements: Extraocular movements intact.     Conjunctiva/sclera: Conjunctivae normal.     Pupils: Pupils are equal, round, and reactive to light.  Cardiovascular:     Rate and Rhythm: Normal rate and regular rhythm.     Pulses: Normal pulses.     Heart sounds: Normal heart sounds.  Pulmonary:     Effort: Pulmonary effort is normal.     Breath sounds: Wheezing (diffuse mild expiratory wheezing) present.  Musculoskeletal:        General: Normal range of motion.     Cervical back: Normal range of motion and neck supple.  Skin:    General: Skin is warm and dry.  Neurological:     General: No focal deficit present.     Mental Status: She is alert and oriented to person, place, and time.  Psychiatric:        Mood and Affect: Mood normal.        Behavior: Behavior normal.        Thought Content: Thought content normal.        Judgment: Judgment normal.    BP 119/83   Pulse 69   Temp 98 F (36.7 C)   Ht 5' 3.5" (1.613 m)   Wt 211 lb 3.2 oz (95.8 kg)   SpO2 97%   BMI 36.83 kg/m  Wt Readings from Last 3 Encounters:  10/01/20 211 lb 3.2 oz (95.8 kg)  04/01/20 213 lb (96.6 kg)  02/22/19 209 lb (94.8 kg)    Health Maintenance Due  Topic Date Due   Hepatitis C Screening  Never done   COVID-19 Vaccine (3 - Booster for Pfizer series) 07/31/2019   INFLUENZA VACCINE  08/26/2020    There are no preventive care reminders to display for this patient.   Lab Results  Component Value Date   TSH 1.25 03/16/2018   Lab Results  Component Value Date   WBC 11.3 (H) 11/29/2019   HGB 14.5 11/29/2019   HCT 42.0 11/29/2019   MCV 89.2 11/29/2019   PLT 312 11/29/2019   Lab Results  Component Value Date   NA  137 11/29/2019   K 3.9 11/29/2019   CO2 31 11/29/2019   GLUCOSE 105 (H) 11/29/2019   BUN 11  11/29/2019   CREATININE 0.84 11/29/2019   BILITOT 0.5 11/29/2019   ALKPHOS 96 03/16/2018   AST 12 11/29/2019   ALT 11 11/29/2019   PROT 7.3 11/29/2019   ALBUMIN 4.5 03/16/2018   CALCIUM 10.0 11/29/2019   GFR 77.43 03/16/2018   Lab Results  Component Value Date   CHOL 212 (H) 11/29/2019   Lab Results  Component Value Date   HDL 43 (L) 11/29/2019   Lab Results  Component Value Date   LDLCALC 121 (H) 11/29/2019   Lab Results  Component Value Date   TRIG 339 (H) 11/29/2019   Lab Results  Component Value Date   CHOLHDL 4.9 11/29/2019   No results found for: HGBA1C     Assessment & Plan:   Problem List Items Addressed This Visit   None   1. Essential hypertension -At goal -Refilled HCTZ 25 mg to take once daily -Low salt diet -Exercise & water recommended -Monitor at home and she will call if issues  2. Moderate persistent asthma without complication 3. Wheezing -Still in an acute flare, but improving after [place] -Will start back on Symbicort daily, rinse mouth after use -Continue albuterol QID for the next week, then as needed -Switch to Xyzal at this time as well -Recheck prn -ED if acute sudden shortness of breath or worsening symptoms    Marlow Berenguer M Claryssa Sandner, PA-C

## 2020-10-01 NOTE — Patient Instructions (Signed)
Good to see you today!  Your BP looks great, medication refilled. Keep up good work with this. Start back on Symbicort daily for your asthma. Try Xyzal for allergies. Call if any concerns.  Happy birthday tomorrow!!!!!!

## 2020-10-31 MED FILL — Pantoprazole Sodium EC Tab 20 MG (Base Equiv): ORAL | 90 days supply | Qty: 90 | Fill #2 | Status: AC

## 2020-10-31 MED FILL — Vortioxetine HBr Tab 10 MG (Base Equiv): ORAL | 90 days supply | Qty: 90 | Fill #2 | Status: AC

## 2020-11-01 ENCOUNTER — Other Ambulatory Visit (HOSPITAL_COMMUNITY): Payer: Self-pay

## 2020-11-04 ENCOUNTER — Other Ambulatory Visit (HOSPITAL_COMMUNITY): Payer: Self-pay

## 2020-11-19 DIAGNOSIS — Z760 Encounter for issue of repeat prescription: Secondary | ICD-10-CM | POA: Diagnosis not present

## 2021-01-07 ENCOUNTER — Other Ambulatory Visit: Payer: Self-pay | Admitting: Obstetrics & Gynecology

## 2021-01-07 DIAGNOSIS — Q859 Phakomatosis, unspecified: Secondary | ICD-10-CM

## 2021-01-14 ENCOUNTER — Other Ambulatory Visit: Payer: Self-pay | Admitting: Physical Medicine and Rehabilitation

## 2021-01-14 ENCOUNTER — Other Ambulatory Visit (HOSPITAL_COMMUNITY): Payer: Self-pay

## 2021-01-14 ENCOUNTER — Other Ambulatory Visit (HOSPITAL_COMMUNITY): Payer: Self-pay | Admitting: Physical Medicine and Rehabilitation

## 2021-01-14 DIAGNOSIS — M5416 Radiculopathy, lumbar region: Secondary | ICD-10-CM | POA: Diagnosis not present

## 2021-01-14 DIAGNOSIS — M5459 Other low back pain: Secondary | ICD-10-CM | POA: Diagnosis not present

## 2021-01-14 DIAGNOSIS — F419 Anxiety disorder, unspecified: Secondary | ICD-10-CM | POA: Diagnosis not present

## 2021-01-14 DIAGNOSIS — M545 Low back pain, unspecified: Secondary | ICD-10-CM

## 2021-01-14 MED ORDER — DIAZEPAM 10 MG PO TABS
ORAL_TABLET | ORAL | 0 refills | Status: DC
  Filled 2021-01-14: qty 2, 1d supply, fill #0

## 2021-01-14 MED ORDER — HYDROCODONE-ACETAMINOPHEN 10-325 MG PO TABS
1.0000 | ORAL_TABLET | Freq: Four times a day (QID) | ORAL | 0 refills | Status: DC | PRN
  Filled 2021-01-14: qty 20, 5d supply, fill #0

## 2021-01-18 ENCOUNTER — Ambulatory Visit (HOSPITAL_COMMUNITY)
Admission: RE | Admit: 2021-01-18 | Discharge: 2021-01-18 | Disposition: A | Discharge status: Home / Self Care | Payer: 59 | Source: Ambulatory Visit | Attending: Physical Medicine and Rehabilitation | Admitting: Physical Medicine and Rehabilitation

## 2021-01-18 DIAGNOSIS — M545 Low back pain, unspecified: Secondary | ICD-10-CM | POA: Diagnosis not present

## 2021-01-26 DIAGNOSIS — E559 Vitamin D deficiency, unspecified: Secondary | ICD-10-CM

## 2021-01-26 HISTORY — DX: Vitamin D deficiency, unspecified: E55.9

## 2021-01-27 ENCOUNTER — Other Ambulatory Visit: Payer: Self-pay | Admitting: Family Medicine

## 2021-01-28 ENCOUNTER — Other Ambulatory Visit (HOSPITAL_COMMUNITY): Payer: Self-pay

## 2021-01-28 DIAGNOSIS — Z0289 Encounter for other administrative examinations: Secondary | ICD-10-CM

## 2021-01-28 MED ORDER — VORTIOXETINE HBR 10 MG PO TABS
10.0000 mg | ORAL_TABLET | Freq: Every day | ORAL | 3 refills | Status: DC
  Filled 2021-01-28: qty 90, 90d supply, fill #0
  Filled 2021-04-24: qty 90, 90d supply, fill #1
  Filled 2021-07-23: qty 90, 90d supply, fill #2
  Filled 2021-10-21: qty 90, 90d supply, fill #3

## 2021-01-28 MED ORDER — PANTOPRAZOLE SODIUM 20 MG PO TBEC
20.0000 mg | DELAYED_RELEASE_TABLET | Freq: Every day | ORAL | 3 refills | Status: DC
  Filled 2021-01-28: qty 90, 90d supply, fill #0
  Filled 2021-04-24: qty 90, 90d supply, fill #1
  Filled 2021-07-11: qty 90, 90d supply, fill #2
  Filled 2021-09-28: qty 90, 90d supply, fill #3

## 2021-02-05 DIAGNOSIS — M5416 Radiculopathy, lumbar region: Secondary | ICD-10-CM | POA: Diagnosis not present

## 2021-02-12 ENCOUNTER — Other Ambulatory Visit: Payer: Self-pay

## 2021-02-12 ENCOUNTER — Ambulatory Visit (INDEPENDENT_AMBULATORY_CARE_PROVIDER_SITE_OTHER): Payer: 59 | Admitting: Family Medicine

## 2021-02-12 ENCOUNTER — Encounter (INDEPENDENT_AMBULATORY_CARE_PROVIDER_SITE_OTHER): Payer: Self-pay | Admitting: Family Medicine

## 2021-02-12 VITALS — BP 108/77 | HR 81 | Temp 98.4°F | Ht 64.0 in | Wt 201.0 lb

## 2021-02-12 DIAGNOSIS — Z1331 Encounter for screening for depression: Secondary | ICD-10-CM

## 2021-02-12 DIAGNOSIS — R0602 Shortness of breath: Secondary | ICD-10-CM

## 2021-02-12 DIAGNOSIS — R5383 Other fatigue: Secondary | ICD-10-CM | POA: Diagnosis not present

## 2021-02-12 DIAGNOSIS — E669 Obesity, unspecified: Secondary | ICD-10-CM

## 2021-02-12 DIAGNOSIS — Z6834 Body mass index (BMI) 34.0-34.9, adult: Secondary | ICD-10-CM

## 2021-02-12 DIAGNOSIS — K219 Gastro-esophageal reflux disease without esophagitis: Secondary | ICD-10-CM

## 2021-02-12 DIAGNOSIS — I10 Essential (primary) hypertension: Secondary | ICD-10-CM

## 2021-02-12 DIAGNOSIS — Z9189 Other specified personal risk factors, not elsewhere classified: Secondary | ICD-10-CM | POA: Diagnosis not present

## 2021-02-12 DIAGNOSIS — G4733 Obstructive sleep apnea (adult) (pediatric): Secondary | ICD-10-CM

## 2021-02-12 DIAGNOSIS — E7849 Other hyperlipidemia: Secondary | ICD-10-CM | POA: Diagnosis not present

## 2021-02-12 DIAGNOSIS — F39 Unspecified mood [affective] disorder: Secondary | ICD-10-CM | POA: Diagnosis not present

## 2021-02-13 ENCOUNTER — Telehealth: Payer: 59 | Admitting: Physician Assistant

## 2021-02-13 ENCOUNTER — Other Ambulatory Visit (HOSPITAL_COMMUNITY): Payer: Self-pay

## 2021-02-13 DIAGNOSIS — B9689 Other specified bacterial agents as the cause of diseases classified elsewhere: Secondary | ICD-10-CM | POA: Diagnosis not present

## 2021-02-13 DIAGNOSIS — J019 Acute sinusitis, unspecified: Secondary | ICD-10-CM

## 2021-02-13 LAB — CBC WITH DIFFERENTIAL/PLATELET
Basophils Absolute: 0.1 10*3/uL (ref 0.0–0.2)
Basos: 1 %
EOS (ABSOLUTE): 0.5 10*3/uL — ABNORMAL HIGH (ref 0.0–0.4)
Eos: 5 %
Hematocrit: 42.9 % (ref 34.0–46.6)
Hemoglobin: 14.6 g/dL (ref 11.1–15.9)
Immature Grans (Abs): 0 10*3/uL (ref 0.0–0.1)
Immature Granulocytes: 0 %
Lymphocytes Absolute: 2.1 10*3/uL (ref 0.7–3.1)
Lymphs: 19 %
MCH: 30.1 pg (ref 26.6–33.0)
MCHC: 34 g/dL (ref 31.5–35.7)
MCV: 89 fL (ref 79–97)
Monocytes Absolute: 0.7 10*3/uL (ref 0.1–0.9)
Monocytes: 6 %
Neutrophils Absolute: 7.5 10*3/uL — ABNORMAL HIGH (ref 1.4–7.0)
Neutrophils: 69 %
Platelets: 311 10*3/uL (ref 150–450)
RBC: 4.85 x10E6/uL (ref 3.77–5.28)
RDW: 12.9 % (ref 11.7–15.4)
WBC: 10.8 10*3/uL (ref 3.4–10.8)

## 2021-02-13 LAB — COMPREHENSIVE METABOLIC PANEL
ALT: 40 IU/L — ABNORMAL HIGH (ref 0–32)
AST: 49 IU/L — ABNORMAL HIGH (ref 0–40)
Albumin/Globulin Ratio: 1.6 (ref 1.2–2.2)
Albumin: 4.7 g/dL (ref 3.8–4.8)
Alkaline Phosphatase: 135 IU/L — ABNORMAL HIGH (ref 44–121)
BUN/Creatinine Ratio: 7 — ABNORMAL LOW (ref 9–23)
BUN: 6 mg/dL (ref 6–24)
Bilirubin Total: 0.5 mg/dL (ref 0.0–1.2)
CO2: 22 mmol/L (ref 20–29)
Calcium: 9.6 mg/dL (ref 8.7–10.2)
Chloride: 101 mmol/L (ref 96–106)
Creatinine, Ser: 0.87 mg/dL (ref 0.57–1.00)
Globulin, Total: 3 g/dL (ref 1.5–4.5)
Glucose: 86 mg/dL (ref 70–99)
Potassium: 3.6 mmol/L (ref 3.5–5.2)
Sodium: 139 mmol/L (ref 134–144)
Total Protein: 7.7 g/dL (ref 6.0–8.5)
eGFR: 85 mL/min/{1.73_m2} (ref >59)

## 2021-02-13 LAB — HEMOGLOBIN A1C
Est. average glucose Bld gHb Est-mCnc: 108 mg/dL
Hgb A1c MFr Bld: 5.4 % (ref 4.8–5.6)

## 2021-02-13 LAB — TSH: TSH: 1.67 u[IU]/mL (ref 0.450–4.500)

## 2021-02-13 LAB — LIPID PANEL WITH LDL/HDL RATIO
Cholesterol, Total: 195 mg/dL (ref 100–199)
HDL: 38 mg/dL — ABNORMAL LOW (ref >39)
LDL Chol Calc (NIH): 121 mg/dL — ABNORMAL HIGH (ref 0–99)
LDL/HDL Ratio: 3.2 ratio (ref 0.0–3.2)
Triglycerides: 205 mg/dL — ABNORMAL HIGH (ref 0–149)
VLDL Cholesterol Cal: 36 mg/dL (ref 5–40)

## 2021-02-13 LAB — T3: T3, Total: 136 ng/dL (ref 71–180)

## 2021-02-13 LAB — INSULIN, RANDOM: INSULIN: 6.6 u[IU]/mL (ref 2.6–24.9)

## 2021-02-13 LAB — T4, FREE: Free T4: 1.2 ng/dL (ref 0.82–1.77)

## 2021-02-13 LAB — FOLATE: Folate: 8.2 ng/mL (ref >3.0)

## 2021-02-13 LAB — VITAMIN B12: Vitamin B-12: 513 pg/mL (ref 232–1245)

## 2021-02-13 LAB — VITAMIN D 25 HYDROXY (VIT D DEFICIENCY, FRACTURES): Vit D, 25-Hydroxy: 24.3 ng/mL — ABNORMAL LOW (ref 30.0–100.0)

## 2021-02-13 MED ORDER — DOXYCYCLINE HYCLATE 100 MG PO TABS
100.0000 mg | ORAL_TABLET | Freq: Two times a day (BID) | ORAL | 0 refills | Status: DC
  Filled 2021-02-13: qty 20, 10d supply, fill #0

## 2021-02-13 NOTE — Progress Notes (Signed)
Chief Complaint:   Margaret (MR# [id number]) is a 41 y.o. female who presents for evaluation and treatment of obesity and related comorbidities. Current Body mass index is 34.5 kg/m. Zanyia has been struggling with her weight for many years and has been unsuccessful in either losing weight, maintaining weight loss, or reaching her healthy weight goal.  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 She lives with her spouse, and daughter and son ages 67 and 58 years old. She desires a 60 lb weight loss in 6 months. She notes Weight Watchers worked best in the past. She eats out 5 times per including lunch and dinner. She likes salty crunchy food, and that is what she snacks on (chips, popcorn, and crackers). She drinks tea with sugar seldomly. Her worst habits are eating too fast and overeating.  Cydne is currently in the action stage of change and ready to dedicate time achieving and maintaining a healthier weight. Raynelle is interested in becoming our patient and working on intensive lifestyle modifications including (but not limited to) diet and exercise for weight loss.  Abbagale's habits were reviewed today and are as follows: Her family eats meals together, she thinks her family will eat healthier with her, her desired weight loss is 51 lbs, she started gaining weight after college once she stopped playing basketball, her heaviest weight ever was 230 pounds, she is a picky eater and doesn't like to eat healthier foods, she has significant food cravings issues, she skips meals frequently, she is frequently drinking liquids with calories, she frequently makes poor food choices, she frequently eats larger portions than normal, and she struggles with emotional eating.  Depression Screen Gal's Food and Mood (modified PHQ-9) score was 18.  Depression screen PHQ 2/9 02/12/2021  Decreased Interest 2  Down, Depressed,  Hopeless 3  PHQ - 2 Score 5  Altered sleeping 3  Tired, decreased energy 3  Change in appetite 3  Feeling bad or failure about yourself  2  Trouble concentrating 2  Moving slowly or fidgety/restless 0  Suicidal thoughts 0  PHQ-9 Score 18  Difficult doing work/chores Somewhat difficult   Subjective:   1. Other fatigue Makeshia admits to daytime somnolence and admits to waking up still tired. Patent has a history of symptoms of daytime fatigue and morning fatigue. Maleea generally gets 7 or 8 hours of sleep per night, and states that she has generally restful sleep. Snoring is present. Apneic episodes are present. Epworth Sleepiness Score is 14.  2. Shortness of breath on exertion Jeroline notes increasing shortness of breath with exercising and seems to be worsening over time with weight gain. She notes getting out of breath sooner with activity than she used to. This has not gotten worse recently. Bernette has asthma and has been on Symbicot daily and albuterol with exercise and 2 times per month when she is not sick. She denies shortness of breath at rest or orthopnea.  3. Essential hypertension Lakethia's blood pressure is at goal today. She was first diagnosed 1-2 years ago. Her blood pressure is well controlled, and she is on hydrochlorothiazide.  4. Gastroesophageal reflux disease, unspecified whether esophagitis present Saraiyah's symptoms are currently controlled. She has a history of IBS as well. She is taking pantoprazole, and she avoids triggers. She has no concerns today.  5. Other hyperlipidemia Bralyn's triglycerides and LDL have been elevated in the past. She is taking  fish oil. She has never been told that she needed medications in the past.   6. OSA (obstructive sleep apnea) Melodee's ESS is 14. Last time she saw hr sleep doctor was many years ago (5 years or so). She doesn't like the mask and notes it is not comfortable.  7. Mood disorder ([place]) with emotional  eating Deoni has a history of anxiety and depression. Her PHQ-9 score is 18. She is take Trintellix and Klonopin as needed, and rarely uses. Her mood is stable and she is not seeing a Social worker.   8. At risk for heart disease Tecla is at a higher than average risk for cardiovascular disease due to hypertension and hyperlipidemia.   Assessment/Plan:   Orders Placed This Encounter  Procedures   Hemoglobin A1c   Folate   Comprehensive metabolic panel   CBC with Differential/Platelet   Vitamin B12   Insulin, random   Lipid Panel With LDL/HDL Ratio   T3   T4, free   TSH   VITAMIN D 25 Hydroxy (Vit-D Deficiency, Fractures)   EKG 12-Lead    Medications Discontinued During This Encounter  Medication Reason   diazepam (VALIUM) 10 MG tablet    levocetirizine (XYZAL) 5 MG tablet    HYDROcodone-acetaminophen (NORCO) 10-325 MG tablet      No orders of the defined types were placed in this encounter.    1. Other fatigue Jazleen does feel that her weight is causing her energy to be lower than it should be. Fatigue may be related to obesity, depression or many other causes. Labs will be ordered, and in the meanwhile, Greeley will focus on self care including making healthy food choices, increasing physical activity and focusing on stress reduction.  - Hemoglobin A1c - Folate - EKG 12-Lead - Vitamin B12 - Insulin, random - VITAMIN D 25 Hydroxy (Vit-D Deficiency, Fractures)  2. Shortness of breath on exertion Evaluna does feel that she gets out of breath more easily that she used to when she exercises. Joanie's shortness of breath appears to be obesity related and exercise induced. She has agreed to work on weight loss and gradually increase exercise to treat her exercise induced shortness of breath. Will continue to monitor closely.  3. Essential hypertension We will check labs today. Earsie will continue her medications and prudent nutritional plan. We will watch for  signs of hypotension as she continues her lifestyle modifications.  - Comprehensive metabolic panel - CBC with Differential/Platelet - T3 - T4, free - TSH  4. Gastroesophageal reflux disease, unspecified whether esophagitis present Intensive lifestyle modifications are the first line treatment for this issue. We discussed several lifestyle modifications today. Aspyn will continue to work on diet, exercise and weight loss efforts. Orders and follow up as documented in patient record.   Counseling If a person has gastroesophageal reflux disease (GERD), food and stomach acid move back up into the esophagus and cause symptoms or problems such as damage to the esophagus. Anti-reflux measures include: raising the head of the bed, avoiding tight clothing or belts, avoiding eating late at night, not lying down shortly after mealtime, and achieving weight loss. Avoid ASA, NSAID's, caffeine, alcohol, and tobacco.  OTC Pepcid and/or Tums are often very helpful for as needed use.  However, for persisting chronic or daily symptoms, stronger medications like Omeprazole may be needed. You may need to avoid foods and drinks such as: Coffee and tea (with or without caffeine). Drinks that contain alcohol. Energy drinks and sports drinks. Bubbly (carbonated)  drinks or sodas. Chocolate and cocoa. Peppermint and mint flavorings. Garlic and onions. Horseradish. Spicy and acidic foods. These include peppers, chili powder, curry powder, vinegar, hot sauces, and BBQ sauce. Citrus fruit juices and citrus fruits, such as oranges, lemons, and limes. Tomato-based foods. These include red sauce, chili, salsa, and pizza with red sauce. Fried and fatty foods. These include donuts, french fries, potato chips, and high-fat dressings. High-fat meats. These include hot dogs, rib eye steak, sausage, ham, and bacon.  5. Other hyperlipidemia Cardiovascular risk and specific lipid/LDL goals reviewed. We discussed  several lifestyle modifications today. We will check labs today. Ji will continue her fish oil and prudent nutritional plan, etc. Orders and follow up as documented in patient record.   Counseling Intensive lifestyle modifications are the first line treatment for this issue. Dietary changes: Increase soluble fiber. Decrease simple carbohydrates. Exercise changes: Moderate to vigorous-intensity aerobic activity 150 minutes per week if tolerated. Lipid-lowering medications: see documented in medical record.  - Comprehensive metabolic panel - Lipid Panel With LDL/HDL Ratio  6. OSA (obstructive sleep apnea) Intensive lifestyle modifications are the first line treatment for this issue. We discussed several lifestyle modifications today. Jull will continue to work on diet, exercise and weight loss efforts. We will continue to monitor. Orders and follow up as documented in patient record.   7. Mood disorder ([place]) with emotional eating Analis declines the need for Dr. Mallie Mussel right now, and will let us know in the future if she needs it. I recommended a life coach to use as needed. Orders and follow up as documented in patient record.   8. Screening for depression Myrissa had a positive depression screening. Depression is commonly associated with obesity and often results in emotional eating behaviors. We will monitor this closely and work on CBT to help improve the non-hunger eating patterns. Referral to Psychology may be required if no improvement is seen as she continues in our clinic.  9. At risk for heart disease Meerab was given approximately 10 minutes of coronary artery disease prevention counseling today. She is 41 y.o. female and has risk factors for heart disease including obesity. We discussed intensive lifestyle modifications today with an emphasis on specific weight loss instructions and strategies.   Repetitive spaced learning was employed today to elicit superior memory  formation and behavioral change.  10. Obesity with current BMI of 34.6 Ayasha is currently in the action stage of change and her goal is to continue with weight loss efforts. I recommend Jene begin the structured treatment plan as follows:  She has agreed to the Category 1 Plan.  Exercise goals: As is, walking for 15 minutes 3 times per week.   Behavioral modification strategies: decreasing eating out, avoiding temptations, and planning for success.  She was informed of the importance of frequent follow-up visits to maximize her success with intensive lifestyle modifications for her multiple health conditions. She was informed we would discuss her lab results at her next visit unless there is a critical issue that needs to be addressed sooner. Blessings agreed to keep her next visit at the agreed upon time to discuss these results.  Objective:   Blood pressure 108/77, pulse 81, temperature 98.4 F (36.9 C), height 5\' 4"  (2.878 m), weight 201 lb (91.2 kg), SpO2 96 %. Body mass index is 34.5 kg/m.  EKG: Normal sinus rhythm, rate 76 BPM.  Indirect Calorimeter completed today shows a VO2 of 240 and a REE of 1656.  Her calculated basal metabolic  rate is 1619 thus her basal metabolic rate is better than expected.  General: Cooperative, alert, well developed, in no acute distress. HEENT: Conjunctivae and lids unremarkable. Cardiovascular: Regular rhythm.  Lungs: Normal work of breathing. Neurologic: No focal deficits.   Lab Results  Component Value Date   CREATININE 0.87 02/12/2021   BUN 6 02/12/2021   NA 139 02/12/2021   K 3.6 02/12/2021   CL 101 02/12/2021   CO2 22 02/12/2021   Lab Results  Component Value Date   ALT 40 (H) 02/12/2021   AST 49 (H) 02/12/2021   ALKPHOS 135 (H) 02/12/2021   BILITOT 0.5 02/12/2021   Lab Results  Component Value Date   HGBA1C 5.4 02/12/2021   Lab Results  Component Value Date   INSULIN 6.6 02/12/2021   Lab Results  Component Value  Date   TSH 1.670 02/12/2021   Lab Results  Component Value Date   CHOL 195 02/12/2021   HDL 38 (L) 02/12/2021   LDLCALC 121 (H) 02/12/2021   LDLDIRECT 137.0 03/16/2018   TRIG 205 (H) 02/12/2021   CHOLHDL 4.9 11/29/2019   Lab Results  Component Value Date   WBC 10.8 02/12/2021   HGB 14.6 02/12/2021   HCT 42.9 02/12/2021   MCV 89 02/12/2021   PLT 311 02/12/2021   No results found for: IRON, TIBC, FERRITIN  Attestation Statements:   Reviewed by clinician on day of visit: allergies, medications, problem list, medical history, surgical history, family history, social history, and previous encounter notes.   Wilhemena Durie, am acting as transcriptionist for [employer], DO.  I have reviewed the above documentation for accuracy and completeness, and I agree with the above. Marjory Sneddon, D.O.  The [place] was signed into law in 2016 which includes the topic of electronic health records.  This provides immediate access to information in MyChart.  This includes consultation notes, operative notes, office notes, lab results and pathology reports.  If you have any questions about what you read please let us know at your next visit so we can discuss your concerns and take corrective action if need be.  We are right here with you.

## 2021-02-13 NOTE — Progress Notes (Signed)
I have spent 5 minutes in review of e-visit questionnaire, review and updating patient chart, medical decision making and response to patient.   Eliannah Hinde Cody [place] Arabie, PA-C    

## 2021-02-13 NOTE — Progress Notes (Signed)

## 2021-02-18 ENCOUNTER — Other Ambulatory Visit (HOSPITAL_COMMUNITY): Payer: Self-pay

## 2021-02-18 MED ORDER — AMOXICILLIN 500 MG PO CAPS
500.0000 mg | ORAL_CAPSULE | Freq: Two times a day (BID) | ORAL | 0 refills | Status: AC
  Filled 2021-02-18: qty 20, 10d supply, fill #0

## 2021-02-18 NOTE — Addendum Note (Signed)
Addended by: Mar Daring on: 02/18/2021 12:08 PM   Modules accepted: Orders

## 2021-02-19 NOTE — Therapy (Signed)
OUTPATIENT PHYSICAL THERAPY THORACOLUMBAR EVALUATION   Patient Name: Monica Benjamin MRN: [id number] DOB:25-Dec-1980, 41 y.o., female Today's Date: 02/24/2021   PT End of Session - 02/24/21 0721     Visit Number 1    Number of Visits 13    Date for PT Re-Evaluation 04/12/21    Authorization Type [facility]    PT Start Time 0721   patient late   PT Stop Time 0758    PT Time Calculation (min) 37 min    Activity Tolerance Patient tolerated treatment well    Behavior During Therapy [facility] for tasks assessed/performed             Past Medical History:  Diagnosis Date   Allergy    Anxiety    Asthma    Back pain    Depression    GERD (gastroesophageal reflux disease)    Hyperlipidemia    Hypertension    IBS (irritable bowel syndrome)    Infertility, female 02/15/2017   IVF treatment   Lumbar herniated disc    L4-L5   Migraines    Other fatigue    Shortness of breath on exertion    Sleep apnea    Trigeminal neuralgia    Past Surgical History:  Procedure Laterality Date   WISDOM TOOTH EXTRACTION     Patient Active Problem List   Diagnosis Date Noted   Mild episode of recurrent major depressive disorder ([place]) 05/10/2018   Hypertriglyceridemia 05/10/2018   Essential hypertension 03/16/2018   Infertility, female 02/15/2017   OSA on CPAP 05/25/2016   Chronic seasonal allergic rhinitis due to pollen 11/25/2015   GAD (generalized anxiety disorder) 11/25/2015   Gastroesophageal reflux disease without esophagitis 11/25/2015   Irritable bowel syndrome with both constipation and diarrhea 11/25/2015   Moderate persistent asthma without complication 36/14/4315    PCP: Leamon Arnt, MD  REFERRING PROVIDER: Suella Broad, MD  REFERRING DIAG: M54.16 (ICD-10-CM) - Radiculopathy, lumbar region   THERAPY DIAG:  Chronic bilateral low back pain without sciatica  Muscle weakness (generalized)  ONSET DATE: acute on chronic LBP, most recent exacerbation December 2022    SUBJECTIVE:                                                                                                                                                                                           SUBJECTIVE STATEMENT: Patient reports about a year ago she was picking up her daughter and "threw out her back." Ever since then she keeps having flare ups of pain. Most recent was around Christmas. She woke up one morning and couldn't move. She reports 3 flare ups  in the past year that kept her immobile for about 3 days. She reports pain everyday in her low back, but the debilitating pain has only happened a few times. No changes in bowel/bladder. No numbness/tingling. She reports occasional pain along the lateral aspect of the right foot noticing it when she walks.   PERTINENT HISTORY:  N/A  PAIN:  Are you having pain? Yes NPRS scale: 3/10 Pain location: low back  PAIN TYPE: aching Pain description: intermittent  Aggravating factors: walking Relieving factors: rest   PRECAUTIONS: None  WEIGHT BEARING RESTRICTIONS No  FALLS:  Has patient fallen in last 6 months? No, Number of falls: 0  LIVING ENVIRONMENT: Lives with: lives with their family Lives in: House/apartment Stairs: Yes; stairs to enter  Has following equipment at home: None  OCCUPATION: Water quality scientist for [employer]   PLOF: Independent  PATIENT GOALS "stretching, strengthen core, overall mobility."    OBJECTIVE:   DIAGNOSTIC FINDINGS:  Lumbar MRI IMPRESSION: Mild background disc degeneration at L4-L5 with a small left paracentral disc herniation at the left lateral recess. Query Left L5 radiculitis. No spinal or foraminal stenosis.  PATIENT SURVEYS:  FOTO 60% function to 67% predicted  SCREENING FOR RED FLAGS: Bowel or bladder incontinence: No Cauda equina syndrome: No   COGNITION:  Overall cognitive status: Within functional limits for tasks assessed     SENSATION:  Light touch: not assessed    MUSCLE  LENGTH: Hamstrings: mild tightness bilaterally, increased LBP with 90/90 on RLE   POSTURE:  Decreased lordosis   PALPATION: Pain and hypomobility PAIVMs L1-L5 [place] and palpable tenderness lumbar paraspinals  LUMBARAROM/PROM  A/PROM A/PROM  02/24/2021  Flexion Fingertips 4 inches from floor   Extension 25% limited increased LBP  Right lateral flexion WNL  Left lateral flexion WNL  Right rotation WNL  Left rotation WNL   (Blank rows = not tested)  LE AROM/PROM:  A/PROM Right 02/24/2021 Left 02/24/2021  Hip flexion    Hip extension    Hip abduction    Hip adduction    Hip internal rotation    Hip external rotation    Knee flexion    Knee extension    Ankle dorsiflexion    Ankle plantarflexion    Ankle inversion    Ankle eversion     (Blank rows = not tested)  LE MMT:  MMT Right 02/24/2021 Left 02/24/2021  Hip flexion 4/5 4-/5 increased LBP  Hip extension 4-/5 increased LBP 4-/5 increased LBP  Hip abduction 4-/5 increased LBP 4-/5 increased LBP  Hip adduction    Hip internal rotation    Hip external rotation    Knee flexion    Knee extension    Ankle dorsiflexion    Ankle plantarflexion    Ankle inversion    Ankle eversion     (Blank rows = not tested)  LUMBAR SPECIAL TESTS:  (-) SLR   FUNCTIONAL TESTS:  Not tested   GAIT: Distance walked: 20 ft  Assistive device utilized: None Level of assistance: Complete Independence Comments: WNL    TODAY'S TREATMENT  [facility] Adult PT Treatment:                                                DATE: 02/24/21 Therapeutic Exercise: Demonstrated and issued HEP.   Therapeutic Activity: Education on assessment findings that will be addressed throughout duration  of POC.     PATIENT EDUCATION:  Education details: see treatment above; discussed FOTO score and anticipated progress.   Person educated: Patient Education method: Explanation, Demonstration, Tactile cues, Verbal cues, and Handouts Education  comprehension: verbalized understanding, returned demonstration, verbal cues required, tactile cues required, and needs further education   HOME EXERCISE PROGRAM: Access Code: [email or web address] URL: [email or web address] Date: 02/24/2021 Prepared by: Gwendolyn Grant  Exercises Supine Bridge - 1 x daily - 7 x weekly - 2 sets - 10 reps Sidelying Hip Abduction - 1 x daily - 7 x weekly - 2 sets - 10 reps Supine Lower Trunk Rotation - 2 x daily - 7 x weekly - 1 sets - 10 reps Prone Press Up - 2 x daily - 7 x weekly - 1 sets - 10 reps Cat Cow - 2 x daily - 7 x weekly - 1 sets - 10 reps Supine Hamstring Stretch with Strap - 2 x daily - 7 x weekly - 3 sets - 30 sec hold   ASSESSMENT:  CLINICAL IMPRESSION: Patient is a 41 y.o. female who was seen today for physical therapy evaluation and treatment for acute on chronic low back pain with most recent exacerbation occurring in December 2022 without known cause. Upon assessment she is noted to have limited and painful lumbar extension AROM as well as pain and hypomobility about all lumbar segments. In addition she has core and bilateral hip weakness as well as mild hamstring tightness bilaterally. Patient will benefit from skilled PT to address above impairments and improve overall function.  REHAB POTENTIAL: Good  CLINICAL DECISION MAKING: Stable/uncomplicated  EVALUATION COMPLEXITY: Low   GOALS: Goals reviewed with patient? No  SHORT TERM GOALS:  STG Name Target Date Goal status  1 Patient will be independent with initial HEP.  Baseline:  03/10/2021 INITIAL  2 Patient will demonstrate pain free lumbar extension AROM to improve ability to reach and lift overhead.  Baseline:  03/17/2021 INITIAL   LONG TERM GOALS:   LTG Name Target Date Goal status  1 Patient will demonstrate at least 4+/5 bilateral hip strength without onset of LBP to improve stability about the chain with prolonged walking.  Baseline: 04/07/2021 INITIAL  2 Patient  will demonstrate proper lifting mechanics of at least 30 lbs from floor to waist height to improve her ability to lift her children.  Baseline: 04/07/2021 INITIAL  3 Patient will score at least 67% function on FOTO to signify clinically meaningful improvement in functional abilities.  Baseline: 04/07/2021 INITIAL  4 Patient will be independent with advanced home program to assist in management of her chronic condition.  Baseline: 04/07/2021 INITIAL   PLAN: PT FREQUENCY: 2x/week  PT DURATION: 6 weeks  PLANNED INTERVENTIONS: Therapeutic exercises, Therapeutic activity, Neuro Muscular re-education, Balance training, Patient/Family education, Joint mobilization, Dry Needling, Electrical stimulation, Spinal mobilization, Cryotherapy, Moist heat, Taping, Traction, Ultrasound, and Manual therapy  PLAN FOR NEXT SESSION: review HEP, progress lumbar mobility focusing on extension, manual/TPDN to lumbar spine, hip and core strengthening.   Gwendolyn Grant, PT, DPT, ATC 02/24/21 8:13 AM

## 2021-02-21 ENCOUNTER — Ambulatory Visit
Admission: RE | Admit: 2021-02-21 | Discharge: 2021-02-21 | Disposition: A | Discharge status: Home / Self Care | Payer: 59 | Source: Ambulatory Visit | Attending: Obstetrics & Gynecology | Admitting: Obstetrics & Gynecology

## 2021-02-21 DIAGNOSIS — Q859 Phakomatosis, unspecified: Secondary | ICD-10-CM

## 2021-02-21 DIAGNOSIS — R922 Inconclusive mammogram: Secondary | ICD-10-CM | POA: Diagnosis not present

## 2021-02-24 ENCOUNTER — Ambulatory Visit: Payer: 59 | Attending: Physical Medicine and Rehabilitation

## 2021-02-24 ENCOUNTER — Other Ambulatory Visit: Payer: Self-pay

## 2021-02-24 DIAGNOSIS — M545 Low back pain, unspecified: Secondary | ICD-10-CM | POA: Diagnosis not present

## 2021-02-24 DIAGNOSIS — G8929 Other chronic pain: Secondary | ICD-10-CM | POA: Insufficient documentation

## 2021-02-24 DIAGNOSIS — M6281 Muscle weakness (generalized): Secondary | ICD-10-CM | POA: Insufficient documentation

## 2021-02-26 ENCOUNTER — Encounter (INDEPENDENT_AMBULATORY_CARE_PROVIDER_SITE_OTHER): Payer: Self-pay | Admitting: Family Medicine

## 2021-02-26 ENCOUNTER — Other Ambulatory Visit: Payer: Self-pay

## 2021-02-26 ENCOUNTER — Other Ambulatory Visit (HOSPITAL_COMMUNITY): Payer: Self-pay

## 2021-02-26 ENCOUNTER — Ambulatory Visit (INDEPENDENT_AMBULATORY_CARE_PROVIDER_SITE_OTHER): Payer: 59 | Admitting: Family Medicine

## 2021-02-26 VITALS — BP 129/71 | HR 73 | Temp 98.6°F | Ht 64.0 in | Wt 206.0 lb

## 2021-02-26 DIAGNOSIS — E7849 Other hyperlipidemia: Secondary | ICD-10-CM

## 2021-02-26 DIAGNOSIS — Z6834 Body mass index (BMI) 34.0-34.9, adult: Secondary | ICD-10-CM

## 2021-02-26 DIAGNOSIS — K219 Gastro-esophageal reflux disease without esophagitis: Secondary | ICD-10-CM

## 2021-02-26 DIAGNOSIS — I10 Essential (primary) hypertension: Secondary | ICD-10-CM

## 2021-02-26 DIAGNOSIS — E669 Obesity, unspecified: Secondary | ICD-10-CM | POA: Diagnosis not present

## 2021-02-26 DIAGNOSIS — E8881 Metabolic syndrome: Secondary | ICD-10-CM

## 2021-02-26 DIAGNOSIS — E559 Vitamin D deficiency, unspecified: Secondary | ICD-10-CM

## 2021-02-26 DIAGNOSIS — E88819 Insulin resistance, unspecified: Secondary | ICD-10-CM

## 2021-02-26 DIAGNOSIS — Z6838 Body mass index (BMI) 38.0-38.9, adult: Secondary | ICD-10-CM | POA: Diagnosis not present

## 2021-02-26 DIAGNOSIS — Z9189 Other specified personal risk factors, not elsewhere classified: Secondary | ICD-10-CM

## 2021-02-26 DIAGNOSIS — G4733 Obstructive sleep apnea (adult) (pediatric): Secondary | ICD-10-CM

## 2021-02-26 DIAGNOSIS — K760 Fatty (change of) liver, not elsewhere classified: Secondary | ICD-10-CM

## 2021-02-26 MED ORDER — VITAMIN D (ERGOCALCIFEROL) 1.25 MG (50000 UNIT) PO CAPS
50000.0000 [IU] | ORAL_CAPSULE | ORAL | 0 refills | Status: DC
  Filled 2021-02-26: qty 4, 28d supply, fill #0

## 2021-02-26 NOTE — Therapy (Signed)
OUTPATIENT PHYSICAL THERAPY TREATMENT NOTE   Patient Name: Monica Benjamin MRN: [id number] DOB:[birth year], 41 y.o., female Today's Date: 03/03/2021  PCP: Leamon Arnt, MD REFERRING PROVIDER: Leamon Arnt, MD   PT End of Session - 03/03/21 [phone]     Visit Number 2    Number of Visits 13    Date for PT Re-Evaluation 04/12/21    Authorization Type [facility]    PT Start Time 0717    PT Stop Time 0758   3 minutes TPDN   PT Time Calculation (min) 41 min    Activity Tolerance Patient tolerated treatment well    Behavior During Therapy [facility] for tasks assessed/performed             Past Medical History:  Diagnosis Date   Allergy    Anxiety    Asthma    Back pain    Depression    GERD (gastroesophageal reflux disease)    Hyperlipidemia    Hypertension    IBS (irritable bowel syndrome)    Infertility, female 02/15/2017   IVF treatment   Lumbar herniated disc    L4-L5   Migraines    Other fatigue    Shortness of breath on exertion    Sleep apnea    Trigeminal neuralgia    Past Surgical History:  Procedure Laterality Date   WISDOM TOOTH EXTRACTION     Patient Active Problem List   Diagnosis Date Noted   Other hyperlipidemia 02/26/2021   Gastroesophageal reflux disease 02/26/2021   Insulin resistance 02/26/2021   Vitamin D deficiency 02/26/2021   OSA (obstructive sleep apnea) 02/26/2021   NAFLD (nonalcoholic fatty liver disease) 02/26/2021   At risk for diabetes mellitus 02/26/2021   Mild episode of recurrent major depressive disorder ([place]) 05/10/2018   Hypertriglyceridemia 05/10/2018   Essential hypertension 03/16/2018   Infertility, female 02/15/2017   OSA on CPAP 05/25/2016   Chronic seasonal allergic rhinitis due to pollen 11/25/2015   GAD (generalized anxiety disorder) 11/25/2015   Gastroesophageal reflux disease without esophagitis 11/25/2015   Irritable bowel syndrome with both constipation and diarrhea 11/25/2015   Moderate persistent asthma  without complication 72/09/4707    REFERRING DIAG: M54.16 (ICD-10-CM) - Radiculopathy, lumbar region   THERAPY DIAG:  Chronic bilateral low back pain without sciatica  Muscle weakness (generalized)  PERTINENT HISTORY: N/A  PRECAUTIONS: None   SUBJECTIVE: Patient reports the back is sore. She reports compliance with HEP.   PAIN:  Are you having pain? Yes NPRS scale: 5/10 Pain location: Rt low back pain  PAIN TYPE: sore Pain description: intermittent  Aggravating factors: prolonged sitting  Relieving factors: stretching   OBJECTIVE:   *Unless otherwise noted all objective measures were captured on initial evaluation.   DIAGNOSTIC FINDINGS:  Lumbar MRI IMPRESSION: Mild background disc degeneration at L4-L5 with a small left paracentral disc herniation at the left lateral recess. Query Left L5 radiculitis. No spinal or foraminal stenosis.   PATIENT SURVEYS:  FOTO 60% function to 67% predicted             MUSCLE LENGTH: Hamstrings: mild tightness bilaterally, increased LBP with 90/90 on RLE    POSTURE:  Decreased lordosis    PALPATION: Pain and hypomobility PAIVMs L1-L5 [place] and palpable tenderness lumbar paraspinals   LUMBARAROM/PROM   A/PROM A/PROM  02/24/2021  Flexion Fingertips 4 inches from floor   Extension 25% limited increased LBP  Right lateral flexion WNL  Left lateral flexion WNL  Right rotation WNL  Left  rotation WNL   (Blank rows = not tested)   LE AROM/PROM:   A/PROM Right 02/24/2021 Left 02/24/2021  Hip flexion      Hip extension      Hip abduction      Hip adduction      Hip internal rotation      Hip external rotation      Knee flexion      Knee extension      Ankle dorsiflexion      Ankle plantarflexion      Ankle inversion      Ankle eversion       (Blank rows = not tested)   LE MMT:   MMT Right 02/24/2021 Left 02/24/2021  Hip flexion 4/5 4-/5 increased LBP  Hip extension 4-/5 increased LBP 4-/5 increased LBP  Hip  abduction 4-/5 increased LBP 4-/5 increased LBP  Hip adduction      Hip internal rotation      Hip external rotation      Knee flexion      Knee extension      Ankle dorsiflexion      Ankle plantarflexion      Ankle inversion      Ankle eversion       (Blank rows = not tested)   LUMBAR SPECIAL TESTS:  (-) SLR    FUNCTIONAL TESTS:  Not tested    GAIT: Distance walked: 20 ft  Assistive device utilized: None Level of assistance: Complete Independence Comments: WNL       TODAY'S TREATMENT   [facility] Adult PT Treatment:                                                DATE: 03/03/21 Therapeutic Exercise: Cat/Cow x 20  Prone pressup 1 x 10  LTR with figure 4 1 min each  Supine pelvic tilt 2 x 10  Manual Therapy: STM/DTM bilateral lumbar paraspinals, bilateral glutes CPAs L1-L5 grade II-III  Trigger Point Dry Needling Treatment: Pre-treatment instruction: Patient instructed on dry needling rationale, procedures, and possible side effects including pain during treatment (achy,cramping feeling), bruising, drop of blood, lightheadedness, nausea, sweating. Patient Consent Given: Yes Education handout provided: Yes Muscles treated: bilateral lumbar erector spinae; bilateral L3 multifidi   Treatment response/outcome: Twitch response elicited and Palpable decrease in muscle tension Post-treatment instructions: Patient instructed to expect possible mild to moderate muscle soreness later today and/or tomorrow. Patient instructed in methods to reduce muscle soreness and to continue prescribed HEP. If patient was dry needled over the lung field, patient was instructed on signs and symptoms of pneumothorax and, however unlikely, to see immediate medical attention should they occur. Patient was also educated on signs and symptoms of infection and to seek medical attention should they occur. Patient verbalized understanding of these instructions and education.           PATIENT EDUCATION:   Education details: see treatment above   Person educated: Patient Education method: Theatre stage manager Education comprehension: verbalized understanding     HOME EXERCISE PROGRAM: Access Code: [email or web address] URL: [email or web address] Date: 02/24/2021 Prepared by: Gwendolyn Grant   Exercises Supine Bridge - 1 x daily - 7 x weekly - 2 sets - 10 reps Sidelying Hip Abduction - 1 x daily - 7 x weekly - 2 sets - 10 reps Supine Lower Trunk Rotation - 2 x  daily - 7 x weekly - 1 sets - 10 reps Prone Press Up - 2 x daily - 7 x weekly - 1 sets - 10 reps Cat Cow - 2 x daily - 7 x weekly - 1 sets - 10 reps Supine Hamstring Stretch with Strap - 2 x daily - 7 x weekly - 3 sets - 30 sec hold     ASSESSMENT:   CLINICAL IMPRESSION: Patient tolerated session well today with heavy emphasis on manual therapy to assist in improving lumbar mobility. She has significant tautness and tenderness about Rt lumbar paraspinals with improvement noted following manual therapy and TPDN. She has notable hypomobility throughout L-spine with patient reporting the most tenderness at L3. Following TPDN to L3 multifidi slight improvement in mobility noted as well as a decrease in tenderness. Continued with L-spine mobility and introduced core stabilization with patient having difficulty properly performing pelvic tilt.    REHAB POTENTIAL: Good   CLINICAL DECISION MAKING: Stable/uncomplicated   EVALUATION COMPLEXITY: Low     GOALS: Goals reviewed with patient? No   SHORT TERM GOALS:   STG Name Target Date Goal status  1 Patient will be independent with initial HEP.  Baseline:  03/10/2021 INITIAL  2 Patient will demonstrate pain free lumbar extension AROM to improve ability to reach and lift overhead.  Baseline:  03/17/2021 INITIAL    LONG TERM GOALS:    LTG Name Target Date Goal status  1 Patient will demonstrate at least 4+/5 bilateral hip strength without onset of LBP to improve stability about  the chain with prolonged walking.  Baseline: 04/07/2021 INITIAL  2 Patient will demonstrate proper lifting mechanics of at least 30 lbs from floor to waist height to improve her ability to lift her children.  Baseline: 04/07/2021 INITIAL  3 Patient will score at least 67% function on FOTO to signify clinically meaningful improvement in functional abilities.  Baseline: 04/07/2021 INITIAL  4 Patient will be independent with advanced home program to assist in management of her chronic condition.  Baseline: 04/07/2021 INITIAL    PLAN: PT FREQUENCY: 2x/week   PT DURATION: 6 weeks   PLANNED INTERVENTIONS: Therapeutic exercises, Therapeutic activity, Neuro Muscular re-education, Balance training, Patient/Family education, Joint mobilization, Dry Needling, Electrical stimulation, Spinal mobilization, Cryotherapy, Moist heat, Taping, Traction, Ultrasound, and Manual therapy   PLAN FOR NEXT SESSION: , progress lumbar mobility focusing on extension, manual/TPDN to lumbar spine, hip and core strengthening.        Rilley Poulter C Videl Nobrega, PT 03/03/2021, 8:00 AM

## 2021-02-27 NOTE — Progress Notes (Signed)
Chief Complaint:   OBESITY Monica Benjamin is here to discuss her progress with her obesity treatment plan along with follow-up of her obesity related diagnoses. Jema is on the Category 1 Plan and states she is following her eating plan approximately 50% of the time. Natoshia states she is walking for 15-30 minutes 2 times per week.  Today's visit was #: 2 Starting weight: 201 lbs Starting date: 02/12/2021 Today's weight: 206 lbs Today's date: 02/26/2021 Total lbs lost to date: 0 Total lbs lost since last in-office visit: 0  Interim History: Monica Benjamin is here today for her first follow-up office visit since starting the program with [place]. All blood work/lab tests that were recently ordered by myself or an outside provider were reviewed with patient today per their request. Extended time was spent counseling her on all new disease processes that were discovered or preexisting ones that are affected by BMI. She understands that many of these abnormalities will need to monitored regularly along with the current treatment plan of prudent dietary changes, in which we are making each and every office visit, to improve these health parameters.   - Monica Benjamin was unable to follow the meal plan much due to not buying the groceries until several days after her office visit. She has no questions or concerns about the plan as she hasn't been able to really follow it that much.  - She gained 1 lb since last OV.    Subjective:   1. Essential hypertension Tishana's blood is at goal today. She is taking hydrochlorothiazide. I discussed CMP with the patient today.  2. Other hyperlipidemia Mauria's triglycerides improved, but are still elevated and LDL is up. She is taking fish oil. I discussed CMP and FLP with the patient today.  3. Gastroesophageal reflux disease, unspecified whether esophagitis present Santia has been trying to avoid eating late. She is taking Protonix with no issues. I  discussed CMP with the patient today.  4. Insulin resistance Avantika has a new diagnosis of insulin resistance. She is not on medications currently. I discussed fasting insulin and A1c with the patient today.  5. Vitamin D deficiency Oretha has a new diagnosis of Vitamin D deficiency. She is not currently on Vit D supplementation. I discussed Vit D with the patient today.  6. OSA (obstructive sleep apnea) Raychell has a diagnosis of sleep apnea. She reports that she is not using a CPAP regularly, as she has no mask.   7. NAFLD (nonalcoholic fatty liver disease) Darria's labs are worsening. She had an abdominal ultrasound in 2015, and it showed fatty liver. I discussed labs with the patient today.  8. At risk for diabetes mellitus Pasha is at higher than average risk for developing diabetes due to her medical history and now, new onset of insulin resistance.    Assessment/Plan:   Meds ordered this encounter  Medications   Vitamin D, Ergocalciferol, (DRISDOL) 1.25 MG (50000 UNIT) CAPS capsule    Sig: Take 1 capsule (50,000 Units total) by mouth every 7 (seven) days.    Dispense:  4 capsule    Refill:  0    30 d supply;  ** OV for RF **   Do not send RF request     1. Essential hypertension - BP is at goal today.  - [place] on pathophysiology of disease and discussed treatment plan, which always includes dietary and lifestyle modification as first line.  - Lifestyle changes such as following our  low salt, heart healthy meal plan and engaging in a regular exercise program discussed  - Avoid buying foods that are: processed, frozen, or prepackaged to avoid excess salt. - Ambulatory blood pressure monitoring encouraged.  Reminded patient that if they ever feel poorly in any way, to check their blood pressure and pulse as well. - We will continue to monitor closely alongside PCP/ specialists.  Pt reminded to also f/up with those individuals as instructed by  them.  - We will continue to monitor symptoms as they relate to the her weight loss journey.   2. Other hyperlipidemia Cardiovascular risk and specific lipid/LDL goals reviewed. We discussed several lifestyle modifications today and Ysabela will continue to work on diet, exercise and weight loss efforts, along with taking her fish oil.  -Orders and follow up as documented in patient record.   Counseling Intensive lifestyle modifications are the first line treatment for this issue. Dietary changes: Increase soluble fiber. Decrease simple carbohydrates. Exercise changes: Moderate to vigorous-intensity aerobic activity 150 minutes per week if tolerated. Lipid-lowering medications: see documented in medical record.  3. Gastroesophageal reflux disease, unspecified whether esophagitis present Intensive lifestyle modifications are the first line treatment for this issue. We discussed several lifestyle modifications today. Mikita will continue her prudent nutritional plan, exercise and weight loss efforts. Orders and follow up as documented in patient record.   Counseling If a person has gastroesophageal reflux disease (GERD), food and stomach acid move back up into the esophagus and cause symptoms or problems such as damage to the esophagus. Anti-reflux measures include: raising the head of the bed, avoiding tight clothing or belts, avoiding eating late at night, not lying down shortly after mealtime, and achieving weight loss. Avoid ASA, NSAID's, caffeine, alcohol, and tobacco.  OTC Pepcid and/or Tums are often very helpful for as needed use.  However, for persisting chronic or daily symptoms, stronger medications like Omeprazole may be needed. You may need to avoid foods and drinks such as: Coffee and tea (with or without caffeine). Drinks that contain alcohol. Energy drinks and sports drinks. Bubbly (carbonated) drinks or sodas. Chocolate and cocoa. Peppermint and mint flavorings. Garlic  and onions. Horseradish. Spicy and acidic foods. These include peppers, chili powder, curry powder, vinegar, hot sauces, and BBQ sauce. Citrus fruit juices and citrus fruits, such as oranges, lemons, and limes. Tomato-based foods. These include red sauce, chili, salsa, and pizza with red sauce. Fried and fatty foods. These include donuts, french fries, potato chips, and high-fat dressings. High-fat meats. These include hot dogs, rib eye steak, sausage, ham, and bacon.  4. Insulin resistance - New onset.  - I counseled patient on pathophysiology of the disease process of I.R.  - Stressed importance of dietary and lifestyle modifications to result in weight loss as first line txmnt - in addition we discussed that medications can help [place] in the management of this disease process as well as with weight loss.  Will consider starting one of these meds in future, or a dose adjustment in the future, as we will focus on prudent nutritional plan at this time.  - continue to decrease simple carbs; increase fiber and proteins -> follow meal plan  - handouts provided on IR after education provided.  All concerns/questions addressed.   - anticipatory guidance given.   - We will recheck A1c and fasting insulin level in approximately 3 months from last check, or as deemed appropriate.    5. Vitamin D deficiency Monica Benjamin agreed to start prescription Vitamin D  50,000 IU every week, with no refills. She will follow-up for routine testing of Vitamin D, at least 2-3 times per year to avoid over-replacement.  - Vitamin D, Ergocalciferol, (DRISDOL) 1.25 MG (50000 UNIT) CAPS capsule; Take 1 capsule (50,000 Units total) by mouth every 7 (seven) days.  Dispense: 4 capsule; Refill: 0   6. OSA (obstructive sleep apnea) Intensive lifestyle modifications are the first line treatment for this issue. We discussed several lifestyle modifications today. Quincie is to contact her primary care physician for sleep medicine  referral. I reminded the patient that she needs to get treatment for this condition. She will continue to work on diet, exercise and weight loss efforts. We will continue to monitor. Orders and follow up as documented in patient record.    7. NAFLD (nonalcoholic fatty liver disease) Education was provided on the diagnosis of non-alcoholic fatty liver disease today and how this condition is obesity related. Monica Benjamin was educated the importance of weight loss. Monica Benjamin agreed to continue with her prudent nutritional plan and weight loss efforts. We will recheck labs in 3-4 months.    8. At risk for diabetes mellitus Monica Benjamin was given approximately 24 minutes of diabetic education and counseling today. We discussed intensive lifestyle modifications today with an emphasis on weight loss as well as increasing exercise and decreasing simple carbohydrates in her diet. We also reviewed medication options with an emphasis on risk versus benefits of those discussed.  Repetitive spaced learning was employed today to elicit superior memory formation and behavioral change.   9. Obesity, current BMI 38.9 Monica Benjamin is currently in the action stage of change. As such, her goal is to continue with weight loss efforts. She has agreed to the Category 1 Plan with breakfast options.   Exercise goals: As is.  Behavioral modification strategies: increasing lean protein intake, decreasing simple carbohydrates, and planning for success.  Kamariya has agreed to follow-up with our clinic in 2 to 3 weeks with Abby Potash, PA-C. She was informed of the importance of frequent follow-up visits to maximize her success with intensive lifestyle modifications for her multiple health conditions.    Objective:   Blood pressure 129/71, pulse 73, temperature 98.6 F (37 C), height 5\' 4"  (1.626 m), weight 206 lb (93.4 kg), SpO2 98 %. Body mass index is 35.36 kg/m.  General: Cooperative, alert, well developed, in no acute  distress. HEENT: Conjunctivae and lids unremarkable. Cardiovascular: Regular rhythm.  Lungs: Normal work of breathing. Neurologic: No focal deficits.   Lab Results  Component Value Date   CREATININE 0.87 02/12/2021   BUN 6 02/12/2021   NA 139 02/12/2021   K 3.6 02/12/2021   CL 101 02/12/2021   CO2 22 02/12/2021   Lab Results  Component Value Date   ALT 40 (H) 02/12/2021   AST 49 (H) 02/12/2021   ALKPHOS 135 (H) 02/12/2021   BILITOT 0.5 02/12/2021   Lab Results  Component Value Date   HGBA1C 5.4 02/12/2021   Lab Results  Component Value Date   INSULIN 6.6 02/12/2021   Lab Results  Component Value Date   TSH 1.670 02/12/2021   Lab Results  Component Value Date   CHOL 195 02/12/2021   HDL 38 (L) 02/12/2021   LDLCALC 121 (H) 02/12/2021   LDLDIRECT 137.0 03/16/2018   TRIG 205 (H) 02/12/2021   CHOLHDL 4.9 11/29/2019   Lab Results  Component Value Date   VD25OH 24.3 (L) 02/12/2021   VD25OH 27 (L) 05/18/2014   Lab Results  Component Value Date   WBC 10.8 02/12/2021   HGB 14.6 02/12/2021   HCT 42.9 02/12/2021   MCV 89 02/12/2021   PLT 311 02/12/2021   No results found for: IRON, TIBC, FERRITIN  Attestation Statements:   Reviewed by clinician on day of visit: allergies, medications, problem list, medical history, surgical history, family history, social history, and previous encounter notes.  Wilhemena Durie, am acting as transcriptionist for [employer], DO.  I have reviewed the above documentation for accuracy and completeness, and I agree with the above. Marjory Sneddon, D.O.  The [place] was signed into law in 2016 which includes the topic of electronic health records.  This provides immediate access to information in MyChart.  This includes consultation notes, operative notes, office notes, lab results and pathology reports.  If you have any questions about what you read please let us know at your next visit so we can discuss your  concerns and take corrective action if need be.  We are right here with you.

## 2021-03-03 ENCOUNTER — Ambulatory Visit: Payer: 59 | Attending: Physical Medicine and Rehabilitation

## 2021-03-03 ENCOUNTER — Other Ambulatory Visit: Payer: Self-pay

## 2021-03-03 DIAGNOSIS — M6281 Muscle weakness (generalized): Secondary | ICD-10-CM | POA: Insufficient documentation

## 2021-03-03 DIAGNOSIS — M545 Low back pain, unspecified: Secondary | ICD-10-CM | POA: Insufficient documentation

## 2021-03-03 DIAGNOSIS — G8929 Other chronic pain: Secondary | ICD-10-CM | POA: Insufficient documentation

## 2021-03-03 NOTE — Patient Instructions (Signed)

## 2021-03-04 NOTE — Therapy (Incomplete)
OUTPATIENT PHYSICAL THERAPY TREATMENT NOTE   Patient Name: Monica Benjamin MRN: [id number] DOB:02-10-1980, 41 y.o., female Today's Date: 03/04/2021  PCP: Leamon Arnt, MD REFERRING PROVIDER: Leamon Arnt, MD     Past Medical History:  Diagnosis Date   Allergy    Anxiety    Asthma    Back pain    Depression    GERD (gastroesophageal reflux disease)    Hyperlipidemia    Hypertension    IBS (irritable bowel syndrome)    Infertility, female 02/15/2017   IVF treatment   Lumbar herniated disc    L4-L5   Migraines    Other fatigue    Shortness of breath on exertion    Sleep apnea    Trigeminal neuralgia    Past Surgical History:  Procedure Laterality Date   WISDOM TOOTH EXTRACTION     Patient Active Problem List   Diagnosis Date Noted   Other hyperlipidemia 02/26/2021   Gastroesophageal reflux disease 02/26/2021   Insulin resistance 02/26/2021   Vitamin D deficiency 02/26/2021   OSA (obstructive sleep apnea) 02/26/2021   NAFLD (nonalcoholic fatty liver disease) 02/26/2021   At risk for diabetes mellitus 02/26/2021   Mild episode of recurrent major depressive disorder ([place]) 05/10/2018   Hypertriglyceridemia 05/10/2018   Essential hypertension 03/16/2018   Infertility, female 02/15/2017   OSA on CPAP 05/25/2016   Chronic seasonal allergic rhinitis due to pollen 11/25/2015   GAD (generalized anxiety disorder) 11/25/2015   Gastroesophageal reflux disease without esophagitis 11/25/2015   Irritable bowel syndrome with both constipation and diarrhea 11/25/2015   Moderate persistent asthma without complication 59/16/3846    REFERRING DIAG: Radiculopathy, lumbar region   THERAPY DIAG:  No diagnosis found.  PERTINENT HISTORY: N/A  PRECAUTIONS: None   SUBJECTIVE: Patient reports the back is sore. She reports compliance with HEP.   PAIN:  Are you having pain? Yes NPRS scale: 5/10 Pain location: Rt low back pain  PAIN TYPE: sore Pain description:  intermittent  Aggravating factors: prolonged sitting  Relieving factors: stretching   OBJECTIVE:   *Unless otherwise noted all objective measures were captured on initial evaluation.   DIAGNOSTIC FINDINGS:  Lumbar MRI IMPRESSION: Mild background disc degeneration at L4-L5 with a small left paracentral disc herniation at the left lateral recess. Query Left L5 radiculitis. No spinal or foraminal stenosis.   PATIENT SURVEYS:  FOTO 60% function to 67% predicted             MUSCLE LENGTH: Hamstrings: mild tightness bilaterally, increased LBP with 90/90 on RLE    POSTURE:  Decreased lordosis    PALPATION: Pain and hypomobility PAIVMs L1-L5 Tautness and palpable tenderness lumbar paraspinals   LUMBARAROM/PROM   A/PROM A/PROM  02/24/2021  Flexion Fingertips 4 inches from floor   Extension 25% limited increased LBP  Right lateral flexion WNL  Left lateral flexion WNL  Right rotation WNL  Left rotation WNL   LE MMT:   MMT Right 02/24/2021 Left 02/24/2021  Hip flexion 4/5 4-/5 increased LBP  Hip extension 4-/5 increased LBP 4-/5 increased LBP  Hip abduction 4-/5 increased LBP 4-/5 increased LBP  Hip adduction      Hip internal rotation      Hip external rotation      Knee flexion      Knee extension      Ankle dorsiflexion      Ankle plantarflexion      Ankle inversion      Ankle eversion  GAIT: Distance walked: 20 ft  Assistive device utilized: None Level of assistance: Complete Independence Comments: WNL     TODAY'S TREATMENT  03/06/2021: Therapeutic Exercise: ***   03/03/2021: Therapeutic Exercise: Cat/Cow x 20  Prone pressup 1 x 10  LTR with figure 4 1 min each  Supine pelvic tilt 2 x 10  Manual Therapy: STM/DTM bilateral lumbar paraspinals, bilateral glutes CPAs L1-L5 grade II-III Trigger Point Dry Needling Treatment: Pre-treatment instruction: Patient instructed on dry needling rationale, procedures, and possible side effects including pain  during treatment (achy,cramping feeling), bruising, drop of blood, lightheadedness, nausea, sweating. Patient Consent Given: Yes Education handout provided: Yes Muscles treated: bilateral lumbar erector spinae; bilateral L3 multifidi   Treatment response/outcome: Twitch response elicited and Palpable decrease in muscle tension Post-treatment instructions: Patient instructed to expect possible mild to moderate muscle soreness later today and/or tomorrow. Patient instructed in methods to reduce muscle soreness and to continue prescribed HEP. If patient was dry needled over the lung field, patient was instructed on signs and symptoms of pneumothorax and, however unlikely, to see immediate medical attention should they occur. Patient was also educated on signs and symptoms of infection and to seek medical attention should they occur. Patient verbalized understanding of these instructions and education.   PATIENT EDUCATION:  Education details: see treatment above   Person educated: Patient Education method: Theatre stage manager Education comprehension: verbalized understanding   HOME EXERCISE PROGRAM: Access Code: [email or web address]     ASSESSMENT:   CLINICAL IMPRESSION: Patient tolerated therapy well with no adverse effects. ***  Patient tolerated session well today with heavy emphasis on manual therapy to assist in improving lumbar mobility. She has significant tautness and tenderness about Rt lumbar paraspinals with improvement noted following manual therapy and TPDN. She has notable hypomobility throughout L-spine with patient reporting the most tenderness at L3. Following TPDN to L3 multifidi slight improvement in mobility noted as well as a decrease in tenderness. Continued with L-spine mobility and introduced core stabilization with patient having difficulty properly performing pelvic tilt.      GOALS: Goals reviewed with patient? No   SHORT TERM GOALS:   STG Name Target Date Goal status   1 Patient will be independent with initial HEP.  Baseline:  03/10/2021 INITIAL  2 Patient will demonstrate pain free lumbar extension AROM to improve ability to reach and lift overhead.  Baseline:  03/17/2021 INITIAL    LONG TERM GOALS:    LTG Name Target Date Goal status  1 Patient will demonstrate at least 4+/5 bilateral hip strength without onset of LBP to improve stability about the chain with prolonged walking.  Baseline: 04/07/2021 INITIAL  2 Patient will demonstrate proper lifting mechanics of at least 30 lbs from floor to waist height to improve her ability to lift her children.  Baseline: 04/07/2021 INITIAL  3 Patient will score at least 67% function on FOTO to signify clinically meaningful improvement in functional abilities.  Baseline: 04/07/2021 INITIAL  4 Patient will be independent with advanced home program to assist in management of her chronic condition.  Baseline: 04/07/2021 INITIAL     PLAN: PT FREQUENCY: 2x/week   PT DURATION: 6 weeks   PLANNED INTERVENTIONS: Therapeutic exercises, Therapeutic activity, Neuro Muscular re-education, Balance training, Patient/Family education, Joint mobilization, Dry Needling, Electrical stimulation, Spinal mobilization, Cryotherapy, Moist heat, Taping, Traction, Ultrasound, and Manual therapy   PLAN FOR NEXT SESSION: , progress lumbar mobility focusing on extension, manual/TPDN to lumbar spine, hip and core strengthening.  Hilda Blades, PT, DPT, LAT, ATC 03/04/21  3:05 PM Phone: [phone] Fax: [phone]

## 2021-03-06 ENCOUNTER — Encounter: Payer: 59 | Admitting: Physical Therapy

## 2021-03-06 NOTE — Therapy (Incomplete)
OUTPATIENT PHYSICAL THERAPY TREATMENT NOTE   Patient Name: Monica Benjamin MRN: [id number] DOB:August 10, 1980, 41 y.o., female Today's Date: 03/06/2021  PCP: Leamon Arnt, MD REFERRING PROVIDER: Leamon Arnt, MD     Past Medical History:  Diagnosis Date   Allergy    Anxiety    Asthma    Back pain    Depression    GERD (gastroesophageal reflux disease)    Hyperlipidemia    Hypertension    IBS (irritable bowel syndrome)    Infertility, female 02/15/2017   IVF treatment   Lumbar herniated disc    L4-L5   Migraines    Other fatigue    Shortness of breath on exertion    Sleep apnea    Trigeminal neuralgia    Past Surgical History:  Procedure Laterality Date   WISDOM TOOTH EXTRACTION     Patient Active Problem List   Diagnosis Date Noted   Other hyperlipidemia 02/26/2021   Gastroesophageal reflux disease 02/26/2021   Insulin resistance 02/26/2021   Vitamin D deficiency 02/26/2021   OSA (obstructive sleep apnea) 02/26/2021   NAFLD (nonalcoholic fatty liver disease) 02/26/2021   At risk for diabetes mellitus 02/26/2021   Mild episode of recurrent major depressive disorder ([place]) 05/10/2018   Hypertriglyceridemia 05/10/2018   Essential hypertension 03/16/2018   Infertility, female 02/15/2017   OSA on CPAP 05/25/2016   Chronic seasonal allergic rhinitis due to pollen 11/25/2015   GAD (generalized anxiety disorder) 11/25/2015   Gastroesophageal reflux disease without esophagitis 11/25/2015   Irritable bowel syndrome with both constipation and diarrhea 11/25/2015   Moderate persistent asthma without complication 50/27/7412    REFERRING DIAG: M54.16 (ICD-10-CM) - Radiculopathy, lumbar region   THERAPY DIAG:  No diagnosis found.  PERTINENT HISTORY: N/A  PRECAUTIONS: None   SUBJECTIVE: Patient reports the back is sore. She reports compliance with HEP.   PAIN:  Are you having pain? Yes NPRS scale: 5/10 Pain location: Rt low back pain  PAIN TYPE:  sore Pain description: intermittent  Aggravating factors: prolonged sitting  Relieving factors: stretching   OBJECTIVE:   *Unless otherwise noted all objective measures were captured on initial evaluation.   DIAGNOSTIC FINDINGS:  Lumbar MRI IMPRESSION: Mild background disc degeneration at L4-L5 with a small left paracentral disc herniation at the left lateral recess. Query Left L5 radiculitis. No spinal or foraminal stenosis.   PATIENT SURVEYS:  FOTO 60% function to 67% predicted             MUSCLE LENGTH: Hamstrings: mild tightness bilaterally, increased LBP with 90/90 on RLE    POSTURE:  Decreased lordosis    PALPATION: Pain and hypomobility PAIVMs L1-L5 Tautness and palpable tenderness lumbar paraspinals   LUMBARAROM/PROM   A/PROM A/PROM  02/24/2021  Flexion Fingertips 4 inches from floor   Extension 25% limited increased LBP  Right lateral flexion WNL  Left lateral flexion WNL  Right rotation WNL  Left rotation WNL   (Blank rows = not tested)   LE AROM/PROM:   A/PROM Right 02/24/2021 Left 02/24/2021  Hip flexion      Hip extension      Hip abduction      Hip adduction      Hip internal rotation      Hip external rotation      Knee flexion      Knee extension      Ankle dorsiflexion      Ankle plantarflexion      Ankle inversion  Ankle eversion       (Blank rows = not tested)   LE MMT:   MMT Right 02/24/2021 Left 02/24/2021  Hip flexion 4/5 4-/5 increased LBP  Hip extension 4-/5 increased LBP 4-/5 increased LBP  Hip abduction 4-/5 increased LBP 4-/5 increased LBP  Hip adduction      Hip internal rotation      Hip external rotation      Knee flexion      Knee extension      Ankle dorsiflexion      Ankle plantarflexion      Ankle inversion      Ankle eversion       (Blank rows = not tested)   LUMBAR SPECIAL TESTS:  (-) SLR    FUNCTIONAL TESTS:  Not tested    GAIT: Distance walked: 20 ft  Assistive device utilized: None Level of  assistance: Complete Independence Comments: WNL       TODAY'S TREATMENT   [facility] Adult PT Treatment:                                                DATE: 03/03/21 Therapeutic Exercise: Cat/Cow x 20  Prone pressup 1 x 10  LTR with figure 4 1 min each  Supine pelvic tilt 2 x 10  Manual Therapy: STM/DTM bilateral lumbar paraspinals, bilateral glutes CPAs L1-L5 grade II-III  Trigger Point Dry Needling Treatment: Pre-treatment instruction: Patient instructed on dry needling rationale, procedures, and possible side effects including pain during treatment (achy,cramping feeling), bruising, drop of blood, lightheadedness, nausea, sweating. Patient Consent Given: Yes Education handout provided: Yes Muscles treated: bilateral lumbar erector spinae; bilateral L3 multifidi   Treatment response/outcome: Twitch response elicited and Palpable decrease in muscle tension Post-treatment instructions: Patient instructed to expect possible mild to moderate muscle soreness later today and/or tomorrow. Patient instructed in methods to reduce muscle soreness and to continue prescribed HEP. If patient was dry needled over the lung field, patient was instructed on signs and symptoms of pneumothorax and, however unlikely, to see immediate medical attention should they occur. Patient was also educated on signs and symptoms of infection and to seek medical attention should they occur. Patient verbalized understanding of these instructions and education.           PATIENT EDUCATION:  Education details: see treatment above   Person educated: Patient Education method: Theatre stage manager Education comprehension: verbalized understanding     HOME EXERCISE PROGRAM: Access Code: [email or web address] URL: [email or web address] Date: 02/24/2021 Prepared by: Gwendolyn Grant   Exercises Supine Bridge - 1 x daily - 7 x weekly - 2 sets - 10 reps Sidelying Hip Abduction - 1 x daily - 7 x weekly - 2 sets - 10  reps Supine Lower Trunk Rotation - 2 x daily - 7 x weekly - 1 sets - 10 reps Prone Press Up - 2 x daily - 7 x weekly - 1 sets - 10 reps Cat Cow - 2 x daily - 7 x weekly - 1 sets - 10 reps Supine Hamstring Stretch with Strap - 2 x daily - 7 x weekly - 3 sets - 30 sec hold     ASSESSMENT:   CLINICAL IMPRESSION: Patient tolerated session well today with heavy emphasis on manual therapy to assist in improving lumbar mobility. She has significant tautness and tenderness about Rt lumbar paraspinals  with improvement noted following manual therapy and TPDN. She has notable hypomobility throughout L-spine with patient reporting the most tenderness at L3. Following TPDN to L3 multifidi slight improvement in mobility noted as well as a decrease in tenderness. Continued with L-spine mobility and introduced core stabilization with patient having difficulty properly performing pelvic tilt.    REHAB POTENTIAL: Good   CLINICAL DECISION MAKING: Stable/uncomplicated   EVALUATION COMPLEXITY: Low     GOALS: Goals reviewed with patient? No   SHORT TERM GOALS:   STG Name Target Date Goal status  1 Patient will be independent with initial HEP.  Baseline:  03/10/2021 INITIAL  2 Patient will demonstrate pain free lumbar extension AROM to improve ability to reach and lift overhead.  Baseline:  03/17/2021 INITIAL    LONG TERM GOALS:    LTG Name Target Date Goal status  1 Patient will demonstrate at least 4+/5 bilateral hip strength without onset of LBP to improve stability about the chain with prolonged walking.  Baseline: 04/07/2021 INITIAL  2 Patient will demonstrate proper lifting mechanics of at least 30 lbs from floor to waist height to improve her ability to lift her children.  Baseline: 04/07/2021 INITIAL  3 Patient will score at least 67% function on FOTO to signify clinically meaningful improvement in functional abilities.  Baseline: 04/07/2021 INITIAL  4 Patient will be independent with advanced  home program to assist in management of her chronic condition.  Baseline: 04/07/2021 INITIAL    PLAN: PT FREQUENCY: 2x/week   PT DURATION: 6 weeks   PLANNED INTERVENTIONS: Therapeutic exercises, Therapeutic activity, Neuro Muscular re-education, Balance training, Patient/Family education, Joint mobilization, Dry Needling, Electrical stimulation, Spinal mobilization, Cryotherapy, Moist heat, Taping, Traction, Ultrasound, and Manual therapy   PLAN FOR NEXT SESSION: , progress lumbar mobility focusing on extension, manual/TPDN to lumbar spine, hip and core strengthening.        Crissie Figures Raeden Belzer, PT 03/06/2021, 2:18 PM

## 2021-03-10 ENCOUNTER — Ambulatory Visit: Payer: 59 | Admitting: Physical Therapy

## 2021-03-12 NOTE — Therapy (Signed)
OUTPATIENT PHYSICAL THERAPY TREATMENT NOTE   Patient Name: Monica Benjamin MRN: [id number] DOB:09-15-1980, 41 y.o., female Today's Date: 03/13/2021  PCP: Leamon Arnt, MD REFERRING PROVIDER: Suella Broad, MD   PT End of Session - 03/13/21 1226     Visit Number 3    Number of Visits 13    Date for PT Re-Evaluation 04/12/21    Authorization Type [facility]    PT Start Time 0867    PT Stop Time 1320   3 minutes TPDN   PT Time Calculation (min) 45 min    Activity Tolerance Patient tolerated treatment well    Behavior During Therapy WFL for tasks assessed/performed              Past Medical History:  Diagnosis Date   Allergy    Anxiety    Asthma    Back pain    Depression    GERD (gastroesophageal reflux disease)    Hyperlipidemia    Hypertension    IBS (irritable bowel syndrome)    Infertility, female 02/15/2017   IVF treatment   Lumbar herniated disc    L4-L5   Migraines    Other fatigue    Shortness of breath on exertion    Sleep apnea    Trigeminal neuralgia    Past Surgical History:  Procedure Laterality Date   WISDOM TOOTH EXTRACTION     Patient Active Problem List   Diagnosis Date Noted   Other hyperlipidemia 02/26/2021   Gastroesophageal reflux disease 02/26/2021   Insulin resistance 02/26/2021   Vitamin D deficiency 02/26/2021   OSA (obstructive sleep apnea) 02/26/2021   NAFLD (nonalcoholic fatty liver disease) 02/26/2021   At risk for diabetes mellitus 02/26/2021   Mild episode of recurrent major depressive disorder ([place]) 05/10/2018   Hypertriglyceridemia 05/10/2018   Essential hypertension 03/16/2018   Infertility, female 02/15/2017   OSA on CPAP 05/25/2016   Chronic seasonal allergic rhinitis due to pollen 11/25/2015   GAD (generalized anxiety disorder) 11/25/2015   Gastroesophageal reflux disease without esophagitis 11/25/2015   Irritable bowel syndrome with both constipation and diarrhea 11/25/2015   Moderate persistent asthma  without complication 61/95/0932    REFERRING DIAG: M54.16 (ICD-10-CM) - Radiculopathy, lumbar region   THERAPY DIAG:  Chronic bilateral low back pain without sciatica  Muscle weakness (generalized)  PERTINENT HISTORY: N/A  PRECAUTIONS: None   SUBJECTIVE: Patient reports she was sore the day following last session and couldn't really tell if the needling helped.  PAIN:  Are you having pain? Yes NPRS scale: 3/10 Pain location: low back pain  PAIN TYPE: ache Pain description: intermittent  Aggravating factors: prolonged sitting  Relieving factors: stretching   OBJECTIVE:   *Unless otherwise noted all objective measures were captured on initial evaluation.   DIAGNOSTIC FINDINGS:  Lumbar MRI IMPRESSION: Mild background disc degeneration at L4-L5 with a small left paracentral disc herniation at the left lateral recess. Query Left L5 radiculitis. No spinal or foraminal stenosis.   PATIENT SURVEYS:  FOTO 60% function to 67% predicted             MUSCLE LENGTH: Hamstrings: mild tightness bilaterally, increased LBP with 90/90 on RLE    POSTURE:  Decreased lordosis    PALPATION: Pain and hypomobility PAIVMs L1-L5 Tautness and palpable tenderness lumbar paraspinals  03/13/21: pain and hypomobility PAIVM L1-5.    LUMBARAROM/PROM   A/PROM A/PROM  02/24/2021  Flexion Fingertips 4 inches from floor   Extension 25% limited increased LBP  Right lateral  flexion WNL  Left lateral flexion WNL  Right rotation WNL  Left rotation WNL   (Blank rows = not tested)   LE AROM/PROM:   A/PROM Right 02/24/2021 Left 02/24/2021  Hip flexion      Hip extension      Hip abduction      Hip adduction      Hip internal rotation      Hip external rotation      Knee flexion      Knee extension      Ankle dorsiflexion      Ankle plantarflexion      Ankle inversion      Ankle eversion       (Blank rows = not tested)   LE MMT:   MMT Right 02/24/2021 Left 02/24/2021  Hip flexion 4/5  4-/5 increased LBP  Hip extension 4-/5 increased LBP 4-/5 increased LBP  Hip abduction 4-/5 increased LBP 4-/5 increased LBP  Hip adduction      Hip internal rotation      Hip external rotation      Knee flexion      Knee extension      Ankle dorsiflexion      Ankle plantarflexion      Ankle inversion      Ankle eversion       (Blank rows = not tested)   LUMBAR SPECIAL TESTS:  (-) SLR    FUNCTIONAL TESTS:  Not tested    GAIT: Distance walked: 20 ft  Assistive device utilized: None Level of assistance: Complete Independence Comments: WNL       TODAY'S TREATMENT  [facility] Adult PT Treatment:                                                DATE: 03/13/21 Therapeutic Exercise: Cat/Cow 1 x 10  Seated pelvic tilt 2 x 10  Thread the needle 1 x 10 each  Supine TA march 2 x 10  Bridge with adduction ball squeeze 2 x 10  Manual Therapy: STM/DTM bilateral lumbar paraspinals, bilateral glutes CPAs L1-L5 grade II-III Lumbar manipulation  Trigger Point Dry Needling Treatment: Pre-treatment instruction: Patient instructed on dry needling rationale, procedures, and possible side effects including pain during treatment (achy,cramping feeling), bruising, drop of blood, lightheadedness, nausea, sweating. Patient Consent Given: Yes Education handout provided: Previously provided Muscles treated: bilateral lumbar erector spinae   Treatment response/outcome: Palpable decrease in muscle tension Post-treatment instructions: Patient instructed to expect possible mild to moderate muscle soreness later today and/or tomorrow. Patient instructed in methods to reduce muscle soreness and to continue prescribed HEP. If patient was dry needled over the lung field, patient was instructed on signs and symptoms of pneumothorax and, however unlikely, to see immediate medical attention should they occur. Patient was also educated on signs and symptoms of infection and to seek medical attention should they occur.  Patient verbalized understanding of these instructions and education.   [facility] Adult PT Treatment:                                                DATE: 03/03/21 Therapeutic Exercise: Cat/Cow x 20  Prone pressup 1 x 10  LTR with figure 4 1 min each  Supine pelvic tilt 2 x 10  Manual Therapy: STM/DTM bilateral lumbar paraspinals, bilateral glutes CPAs L1-L5 grade II-III  Trigger Point Dry Needling Treatment: Pre-treatment instruction: Patient instructed on dry needling rationale, procedures, and possible side effects including pain during treatment (achy,cramping feeling), bruising, drop of blood, lightheadedness, nausea, sweating. Patient Consent Given: Yes Education handout provided: Yes Muscles treated: bilateral lumbar erector spinae; bilateral L3 multifidi   Treatment response/outcome: Twitch response elicited and Palpable decrease in muscle tension Post-treatment instructions: Patient instructed to expect possible mild to moderate muscle soreness later today and/or tomorrow. Patient instructed in methods to reduce muscle soreness and to continue prescribed HEP. If patient was dry needled over the lung field, patient was instructed on signs and symptoms of pneumothorax and, however unlikely, to see immediate medical attention should they occur. Patient was also educated on signs and symptoms of infection and to seek medical attention should they occur. Patient verbalized understanding of these instructions and education.           PATIENT EDUCATION:  Education details: see treatment  Person educated: Patient Education method: Explanation  Education comprehension: verbalized understanding     HOME EXERCISE PROGRAM: Access Code: [email or web address] URL: [email or web address] Date: 02/24/2021 Prepared by: Gwendolyn Grant   Exercises Supine Bridge - 1 x daily - 7 x weekly - 2 sets - 10 reps Sidelying Hip Abduction - 1 x daily - 7 x weekly - 2 sets - 10 reps Supine Lower Trunk  Rotation - 2 x daily - 7 x weekly - 1 sets - 10 reps Prone Press Up - 2 x daily - 7 x weekly - 1 sets - 10 reps Cat Cow - 2 x daily - 7 x weekly - 1 sets - 10 reps Supine Hamstring Stretch with Strap - 2 x daily - 7 x weekly - 3 sets - 30 sec hold     ASSESSMENT:   CLINICAL IMPRESSION: Patient tolerated session well today without reports of increased pain. Notable improvement in L-spine mobility following manipulation with patient reporting less pain with PAIVM at L4-5. Tautness and palpable tenderness present about bilateral lumbar paraspinals with notable improvement following manual therapy/TPDN. Able to progress core/hip strengthening today with patient overall demonstrating good lumbopelvic stability. She reported low back soreness at the end of session, but improvement in aching pain.    REHAB POTENTIAL: Good   CLINICAL DECISION MAKING: Stable/uncomplicated   EVALUATION COMPLEXITY: Low     GOALS: Goals reviewed with patient? No   SHORT TERM GOALS:   STG Name Target Date Goal status  1 Patient will be independent with initial HEP.  Baseline:  03/10/2021 Achieved   2 Patient will demonstrate pain free lumbar extension AROM to improve ability to reach and lift overhead.  Baseline:  03/17/2021 INITIAL    LONG TERM GOALS:    LTG Name Target Date Goal status  1 Patient will demonstrate at least 4+/5 bilateral hip strength without onset of LBP to improve stability about the chain with prolonged walking.  Baseline: 04/07/2021 INITIAL  2 Patient will demonstrate proper lifting mechanics of at least 30 lbs from floor to waist height to improve her ability to lift her children.  Baseline: 04/07/2021 INITIAL  3 Patient will score at least 67% function on FOTO to signify clinically meaningful improvement in functional abilities.  Baseline: 04/07/2021 INITIAL  4 Patient will be independent with advanced home program to assist in management of her chronic condition.  Baseline: 04/07/2021  INITIAL    PLAN: PT  FREQUENCY: 2x/week   PT DURATION: 6 weeks   PLANNED INTERVENTIONS: Therapeutic exercises, Therapeutic activity, Neuro Muscular re-education, Balance training, Patient/Family education, Joint mobilization, Dry Needling, Electrical stimulation, Spinal mobilization, Cryotherapy, Moist heat, Taping, Traction, Ultrasound, and Manual therapy   PLAN FOR NEXT SESSION: , progress lumbar mobility focusing on extension, manual/TPDN to lumbar spine, hip and core strengthening.      Gwendolyn Grant, PT, DPT, ATC 03/13/21 1:24 PM

## 2021-03-13 ENCOUNTER — Other Ambulatory Visit: Payer: Self-pay

## 2021-03-13 ENCOUNTER — Ambulatory Visit: Payer: 59

## 2021-03-13 DIAGNOSIS — M6281 Muscle weakness (generalized): Secondary | ICD-10-CM

## 2021-03-13 DIAGNOSIS — M545 Low back pain, unspecified: Secondary | ICD-10-CM | POA: Diagnosis not present

## 2021-03-13 DIAGNOSIS — G8929 Other chronic pain: Secondary | ICD-10-CM | POA: Diagnosis not present

## 2021-03-13 NOTE — Therapy (Incomplete)
OUTPATIENT PHYSICAL THERAPY TREATMENT NOTE   Patient Name: Monica Benjamin MRN: [id number] DOB:1980/04/13, 41 y.o., female Today's Date: 03/13/2021  PCP: Leamon Arnt, MD REFERRING PROVIDER: Leamon Arnt, MD      Past Medical History:  Diagnosis Date   Allergy    Anxiety    Asthma    Back pain    Depression    GERD (gastroesophageal reflux disease)    Hyperlipidemia    Hypertension    IBS (irritable bowel syndrome)    Infertility, female 02/15/2017   IVF treatment   Lumbar herniated disc    L4-L5   Migraines    Other fatigue    Shortness of breath on exertion    Sleep apnea    Trigeminal neuralgia    Past Surgical History:  Procedure Laterality Date   WISDOM TOOTH EXTRACTION     Patient Active Problem List   Diagnosis Date Noted   Other hyperlipidemia 02/26/2021   Gastroesophageal reflux disease 02/26/2021   Insulin resistance 02/26/2021   Vitamin D deficiency 02/26/2021   OSA (obstructive sleep apnea) 02/26/2021   NAFLD (nonalcoholic fatty liver disease) 02/26/2021   At risk for diabetes mellitus 02/26/2021   Mild episode of recurrent major depressive disorder ([place]) 05/10/2018   Hypertriglyceridemia 05/10/2018   Essential hypertension 03/16/2018   Infertility, female 02/15/2017   OSA on CPAP 05/25/2016   Chronic seasonal allergic rhinitis due to pollen 11/25/2015   GAD (generalized anxiety disorder) 11/25/2015   Gastroesophageal reflux disease without esophagitis 11/25/2015   Irritable bowel syndrome with both constipation and diarrhea 11/25/2015   Moderate persistent asthma without complication 88/41/6606    REFERRING DIAG: M54.16 (ICD-10-CM) - Radiculopathy, lumbar region   THERAPY DIAG:  No diagnosis found.  PERTINENT HISTORY: N/A  PRECAUTIONS: None   SUBJECTIVE:  PAIN:  Are you having pain? Yes NPRS scale: 3/10 Pain location: low back pain  PAIN TYPE: ache Pain description: intermittent  Aggravating factors: prolonged sitting   Relieving factors: stretching   OBJECTIVE:   *Unless otherwise noted all objective measures were captured on initial evaluation.   DIAGNOSTIC FINDINGS:  Lumbar MRI IMPRESSION: Mild background disc degeneration at L4-L5 with a small left paracentral disc herniation at the left lateral recess. Query Left L5 radiculitis. No spinal or foraminal stenosis.   PATIENT SURVEYS:  FOTO 60% function to 67% predicted             MUSCLE LENGTH: Hamstrings: mild tightness bilaterally, increased LBP with 90/90 on RLE    POSTURE:  Decreased lordosis    PALPATION: Pain and hypomobility PAIVMs L1-L5 Tautness and palpable tenderness lumbar paraspinals  03/13/21: pain and hypomobility PAIVM L1-5.    LUMBARAROM/PROM   A/PROM A/PROM  02/24/2021  Flexion Fingertips 4 inches from floor   Extension 25% limited increased LBP  Right lateral flexion WNL  Left lateral flexion WNL  Right rotation WNL  Left rotation WNL   (Blank rows = not tested)   LE AROM/PROM:   A/PROM Right 02/24/2021 Left 02/24/2021  Hip flexion      Hip extension      Hip abduction      Hip adduction      Hip internal rotation      Hip external rotation      Knee flexion      Knee extension      Ankle dorsiflexion      Ankle plantarflexion      Ankle inversion      Ankle eversion       (  Blank rows = not tested)   LE MMT:   MMT Right 02/24/2021 Left 02/24/2021  Hip flexion 4/5 4-/5 increased LBP  Hip extension 4-/5 increased LBP 4-/5 increased LBP  Hip abduction 4-/5 increased LBP 4-/5 increased LBP  Hip adduction      Hip internal rotation      Hip external rotation      Knee flexion      Knee extension      Ankle dorsiflexion      Ankle plantarflexion      Ankle inversion      Ankle eversion       (Blank rows = not tested)   LUMBAR SPECIAL TESTS:  (-) SLR    FUNCTIONAL TESTS:  Not tested    GAIT: Distance walked: 20 ft  Assistive device utilized: None Level of assistance: Complete  Independence Comments: WNL       TODAY'S TREATMENT  [facility] Adult PT Treatment:                                                DATE: 03/17/21 Therapeutic Exercise: *** Manual Therapy: *** Neuromuscular re-ed: *** Therapeutic Activity: *** Modalities: *** Self Care: ***   [facility] Adult PT Treatment:                                                DATE: 03/13/21 Therapeutic Exercise: Cat/Cow 1 x 10  Seated pelvic tilt 2 x 10  Thread the needle 1 x 10 each  Supine TA march 2 x 10  Bridge with adduction ball squeeze 2 x 10  Manual Therapy: STM/DTM bilateral lumbar paraspinals, bilateral glutes CPAs L1-L5 grade II-III Lumbar manipulation  Trigger Point Dry Needling Treatment: Pre-treatment instruction: Patient instructed on dry needling rationale, procedures, and possible side effects including pain during treatment (achy,cramping feeling), bruising, drop of blood, lightheadedness, nausea, sweating. Patient Consent Given: Yes Education handout provided: Previously provided Muscles treated: bilateral lumbar erector spinae   Treatment response/outcome: Palpable decrease in muscle tension Post-treatment instructions: Patient instructed to expect possible mild to moderate muscle soreness later today and/or tomorrow. Patient instructed in methods to reduce muscle soreness and to continue prescribed HEP. If patient was dry needled over the lung field, patient was instructed on signs and symptoms of pneumothorax and, however unlikely, to see immediate medical attention should they occur. Patient was also educated on signs and symptoms of infection and to seek medical attention should they occur. Patient verbalized understanding of these instructions and education.   [facility] Adult PT Treatment:                                                DATE: 03/03/21 Therapeutic Exercise: Cat/Cow x 20  Prone pressup 1 x 10  LTR with figure 4 1 min each  Supine pelvic tilt 2 x 10  Manual Therapy: STM/DTM  bilateral lumbar paraspinals, bilateral glutes CPAs L1-L5 grade II-III  Trigger Point Dry Needling Treatment: Pre-treatment instruction: Patient instructed on dry needling rationale, procedures, and possible side effects including pain during treatment (achy,cramping feeling), bruising, drop of blood,  lightheadedness, nausea, sweating. Patient Consent Given: Yes Education handout provided: Yes Muscles treated: bilateral lumbar erector spinae; bilateral L3 multifidi   Treatment response/outcome: Twitch response elicited and Palpable decrease in muscle tension Post-treatment instructions: Patient instructed to expect possible mild to moderate muscle soreness later today and/or tomorrow. Patient instructed in methods to reduce muscle soreness and to continue prescribed HEP. If patient was dry needled over the lung field, patient was instructed on signs and symptoms of pneumothorax and, however unlikely, to see immediate medical attention should they occur. Patient was also educated on signs and symptoms of infection and to seek medical attention should they occur. Patient verbalized understanding of these instructions and education.           PATIENT EDUCATION:  Education details: see treatment  Person educated: Patient Education method: Explanation  Education comprehension: verbalized understanding     HOME EXERCISE PROGRAM: Access Code: [email or web address] URL: [email or web address] Date: 02/24/2021 Prepared by: Gwendolyn Grant   Exercises Supine Bridge - 1 x daily - 7 x weekly - 2 sets - 10 reps Sidelying Hip Abduction - 1 x daily - 7 x weekly - 2 sets - 10 reps Supine Lower Trunk Rotation - 2 x daily - 7 x weekly - 1 sets - 10 reps Prone Press Up - 2 x daily - 7 x weekly - 1 sets - 10 reps Cat Cow - 2 x daily - 7 x weekly - 1 sets - 10 reps Supine Hamstring Stretch with Strap - 2 x daily - 7 x weekly - 3 sets - 30 sec hold     ASSESSMENT:   CLINICAL IMPRESSION:    REHAB  POTENTIAL: Good   CLINICAL DECISION MAKING: Stable/uncomplicated   EVALUATION COMPLEXITY: Low     GOALS: Goals reviewed with patient? No   SHORT TERM GOALS:   STG Name Target Date Goal status  1 Patient will be independent with initial HEP.  Baseline:  03/10/2021 Achieved   2 Patient will demonstrate pain free lumbar extension AROM to improve ability to reach and lift overhead.  Baseline:  03/17/2021 INITIAL    LONG TERM GOALS:    LTG Name Target Date Goal status  1 Patient will demonstrate at least 4+/5 bilateral hip strength without onset of LBP to improve stability about the chain with prolonged walking.  Baseline: 04/07/2021 INITIAL  2 Patient will demonstrate proper lifting mechanics of at least 30 lbs from floor to waist height to improve her ability to lift her children.  Baseline: 04/07/2021 INITIAL  3 Patient will score at least 67% function on FOTO to signify clinically meaningful improvement in functional abilities.  Baseline: 04/07/2021 INITIAL  4 Patient will be independent with advanced home program to assist in management of her chronic condition.  Baseline: 04/07/2021 INITIAL    PLAN: PT FREQUENCY: 2x/week   PT DURATION: 6 weeks   PLANNED INTERVENTIONS: Therapeutic exercises, Therapeutic activity, Neuro Muscular re-education, Balance training, Patient/Family education, Joint mobilization, Dry Needling, Electrical stimulation, Spinal mobilization, Cryotherapy, Moist heat, Taping, Traction, Ultrasound, and Manual therapy   PLAN FOR NEXT SESSION: , progress lumbar mobility focusing on extension, manual/TPDN to lumbar spine, hip and core strengthening.      Gwendolyn Grant, PT, DPT, ATC 03/13/21 2:53 PM

## 2021-03-17 ENCOUNTER — Ambulatory Visit: Payer: 59

## 2021-03-17 ENCOUNTER — Ambulatory Visit (INDEPENDENT_AMBULATORY_CARE_PROVIDER_SITE_OTHER): Payer: 59 | Admitting: Physician Assistant

## 2021-03-17 ENCOUNTER — Other Ambulatory Visit (HOSPITAL_COMMUNITY): Payer: Self-pay

## 2021-03-17 ENCOUNTER — Other Ambulatory Visit: Payer: Self-pay

## 2021-03-17 ENCOUNTER — Encounter (INDEPENDENT_AMBULATORY_CARE_PROVIDER_SITE_OTHER): Payer: Self-pay | Admitting: Physician Assistant

## 2021-03-17 VITALS — BP 109/78 | HR 67 | Temp 97.5°F | Ht 64.0 in | Wt 203.0 lb

## 2021-03-17 DIAGNOSIS — Z6834 Body mass index (BMI) 34.0-34.9, adult: Secondary | ICD-10-CM

## 2021-03-17 DIAGNOSIS — Z9189 Other specified personal risk factors, not elsewhere classified: Secondary | ICD-10-CM

## 2021-03-17 DIAGNOSIS — E559 Vitamin D deficiency, unspecified: Secondary | ICD-10-CM

## 2021-03-17 DIAGNOSIS — G4733 Obstructive sleep apnea (adult) (pediatric): Secondary | ICD-10-CM

## 2021-03-17 DIAGNOSIS — E669 Obesity, unspecified: Secondary | ICD-10-CM

## 2021-03-17 MED ORDER — VITAMIN D (ERGOCALCIFEROL) 1.25 MG (50000 UNIT) PO CAPS
50000.0000 [IU] | ORAL_CAPSULE | ORAL | 0 refills | Status: DC
  Filled 2021-03-17 – 2021-03-24 (×2): qty 4, 28d supply, fill #0

## 2021-03-17 NOTE — Progress Notes (Unsigned)
Chief Complaint:   OBESITY Monica Benjamin is here to discuss her progress with her obesity treatment plan along with follow-up of her obesity related diagnoses. Monica Benjamin is on the Category 1 Plan with breakfast options and states she is following her eating plan approximately 70% of the time. Monica Benjamin states she is walking for 30 minutes 3 times per week.  Today's visit was #: 3 Starting weight: 201 lbs Starting date: 02/12/2021 Today's weight: 203 lbs Today's date: 03/17/2021 Total lbs lost to date: 0 Total lbs lost since last in-office visit: 3 lbs  Interim History: Monica Benjamin states that when she eats cereal for breakfast, she only puts enough mild to cover the cereal and is not using the full 8 ounces.  She is getting about 4-5 ounces of protein for dinner.  She is still not using her CPAP, although she has a mask.  Subjective:   1. Vitamin D deficiency She is on vitamin D.  No nausea, vomiting, or muscle weakness.  2. OSA (obstructive sleep apnea) Monica Benjamin is not using her CPAP as it "is not comfortable".  3. At risk for heart disease Monica Benjamin is at higher than average risk for cardiovascular disease due to obesity.  Assessment/Plan:   1. Vitamin D deficiency Low Vitamin D level contributes to fatigue and are associated with obesity, breast, and colon cancer. She agrees to continue to take prescription Vitamin D @50 ,000 IU every week and will follow-up for routine testing of Vitamin D, at least 2-3 times per year to avoid over-replacement.  - Refill Vitamin D, Ergocalciferol, (DRISDOL) 1.25 MG (50000 UNIT) CAPS capsule; Take 1 capsule (50,000 Units total) by mouth every 7 (seven) days.  Dispense: 4 capsule; Refill: 0  2. OSA (obstructive sleep apnea) Monica Benjamin was educated on the importance of using her CPAP for sleep every time she lies down.  Offered to send a new referral to Neurology or Pulmonary for fitting as it has been >3 years since she has been seen for OSA.  3. At  risk for heart disease Monica Benjamin was given approximately 15 minutes of coronary artery disease prevention counseling today. She is 41 y.o. female and has risk factors for heart disease including obesity. We discussed intensive lifestyle modifications today with an emphasis on specific weight loss instructions and strategies.  Repetitive spaced learning was employed today to elicit superior memory formation and behavioral change.   4. Obesity, current BMI 34.83  Monica Benjamin is currently in the action stage of change. As such, her goal is to continue with weight loss efforts. She has agreed to the Category 1 Plan with breakfast options.   Protein equivalent list given today.  Exercise goals:  As is.  Behavioral modification strategies: increasing lean protein intake and meal planning and cooking strategies.  Monica Benjamin has agreed to follow-up with our clinic in 2 weeks. She was informed of the importance of frequent follow-up visits to maximize her success with intensive lifestyle modifications for her multiple health conditions.   Objective:   Blood pressure 109/78, pulse 67, temperature (!) 97.5 F (36.4 C), height 5\' 4"  (1.626 m), weight 203 lb (92.1 kg), SpO2 98 %. Body mass index is 34.84 kg/m.  General: Cooperative, alert, well developed, in no acute distress. HEENT: Conjunctivae and lids unremarkable. Cardiovascular: Regular rhythm.  Lungs: Normal work of breathing. Neurologic: No focal deficits.   Lab Results  Component Value Date   CREATININE 0.87 02/12/2021   BUN 6 02/12/2021   NA 139 02/12/2021   K 3.6  02/12/2021   CL 101 02/12/2021   CO2 22 02/12/2021   Lab Results  Component Value Date   ALT 40 (H) 02/12/2021   AST 49 (H) 02/12/2021   ALKPHOS 135 (H) 02/12/2021   BILITOT 0.5 02/12/2021   Lab Results  Component Value Date   HGBA1C 5.4 02/12/2021   Lab Results  Component Value Date   INSULIN 6.6 02/12/2021   Lab Results  Component Value Date   TSH 1.670  02/12/2021   Lab Results  Component Value Date   CHOL 195 02/12/2021   HDL 38 (L) 02/12/2021   LDLCALC 121 (H) 02/12/2021   LDLDIRECT 137.0 03/16/2018   TRIG 205 (H) 02/12/2021   CHOLHDL 4.9 11/29/2019   Lab Results  Component Value Date   VD25OH 24.3 (L) 02/12/2021   VD25OH 27 (L) 05/18/2014   Lab Results  Component Value Date   WBC 10.8 02/12/2021   HGB 14.6 02/12/2021   HCT 42.9 02/12/2021   MCV 89 02/12/2021   PLT 311 02/12/2021   Attestation Statements:   Reviewed by clinician on day of visit: allergies, medications, problem list, medical history, surgical history, family history, social history, and previous encounter notes.  I, Water quality scientist, CMA, am acting as transcriptionist for Abby Potash, PA-C  I have reviewed the above documentation for accuracy and completeness, and I agree with the above. -  ***

## 2021-03-20 ENCOUNTER — Ambulatory Visit: Payer: 59

## 2021-03-20 IMAGING — MG DIGITAL DIAGNOSTIC BILAT W/ TOMO W/ CAD
8 series · 8 of 24 positions shown · non-contrast
Comparison: Previous exam(s).
COMPARISON: Previous exam(s).

Addendum:
CLINICAL DATA: 1 year follow-up of a probable benign hamartoma in
the left breast.

EXAM:
DIGITAL DIAGNOSTIC BILATERAL MAMMOGRAM WITH TOMO AND CAD
TECHNIQUE: Left digital diagnostic mammography and breast tomosynthesis was
performed. Digital images of the breasts were evaluated with
computer-aided detection.

[R MLO synth-2D]
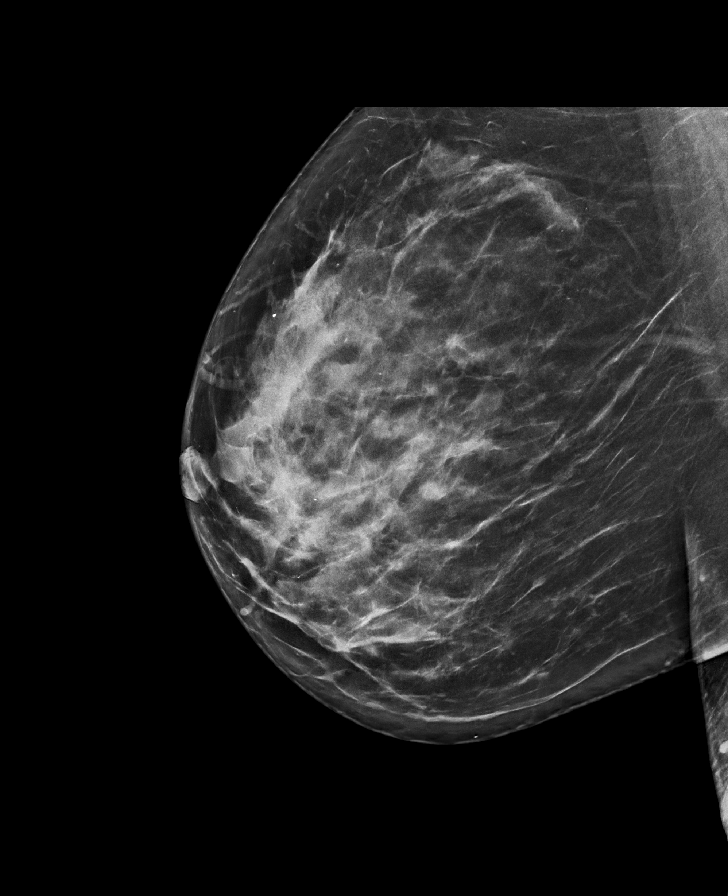

[L CC synth-2D]
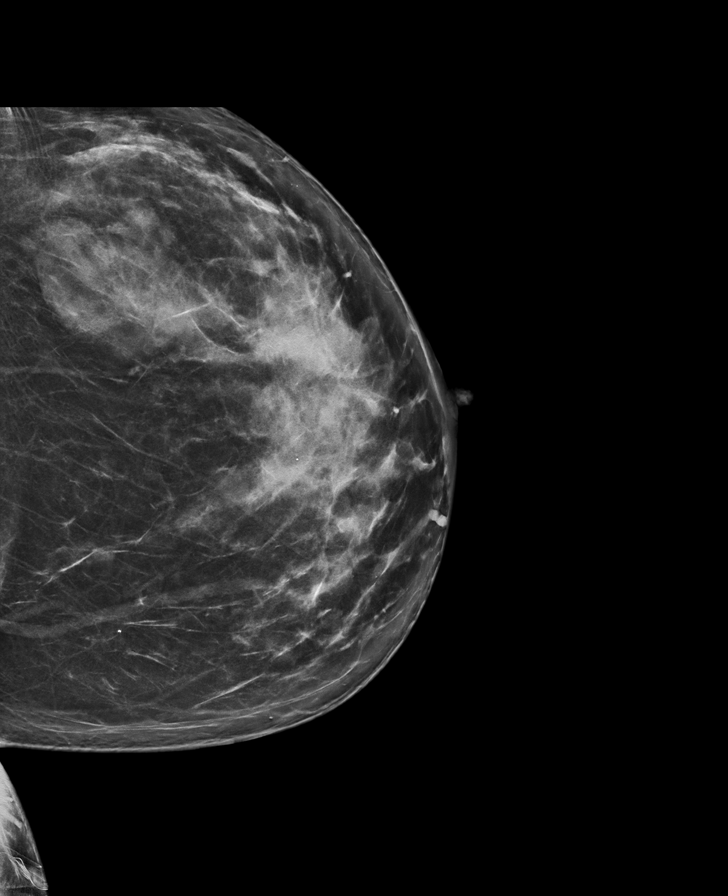

[R CC synth-2D]
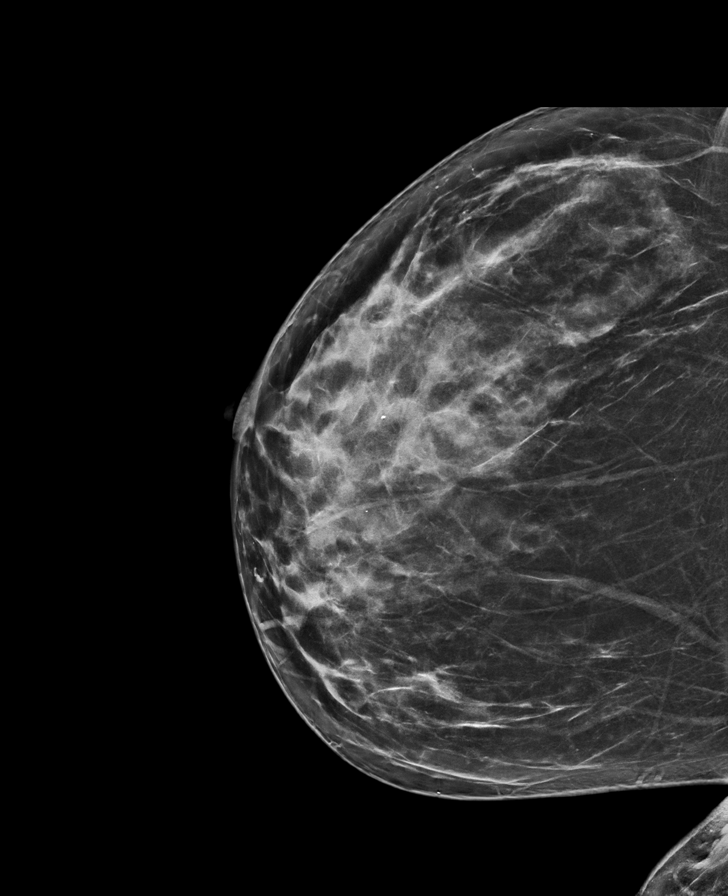

[L MLO synth-2D]
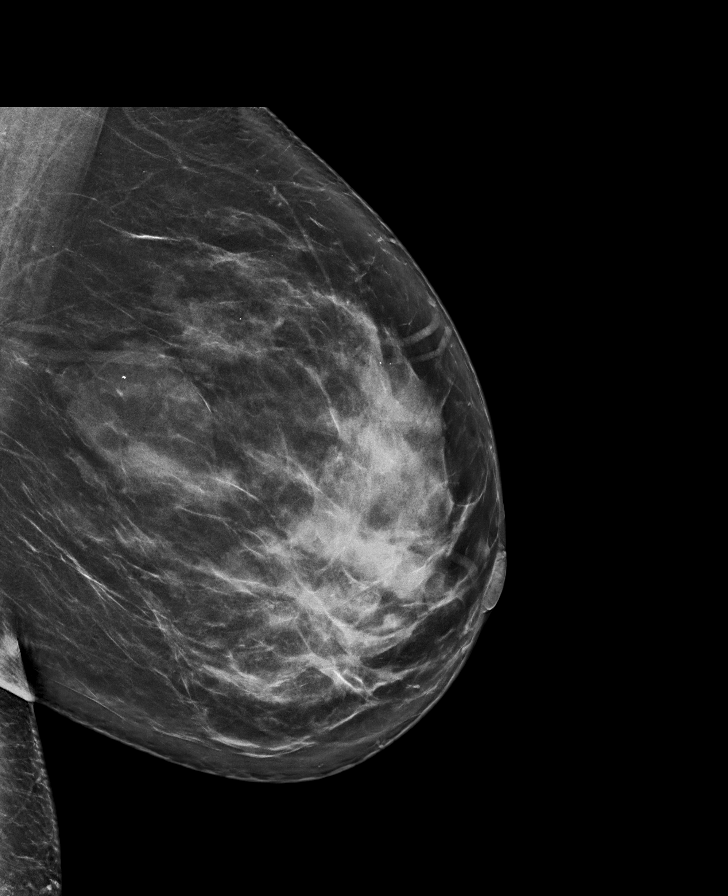

[R CC tomo · tomo slice 41/82.0]
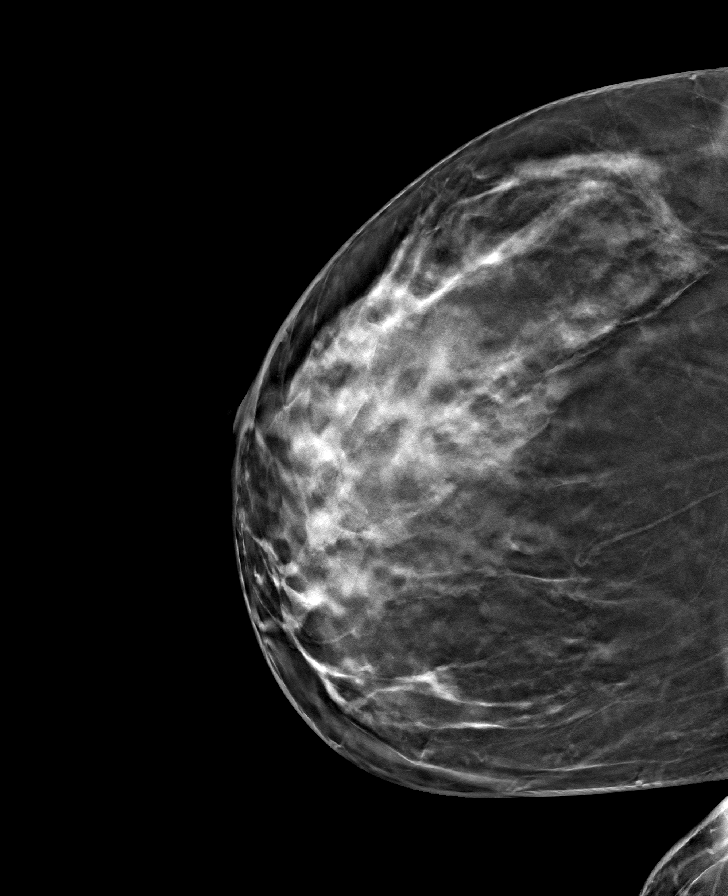

[R MLO tomo · tomo slice 45/90.0]
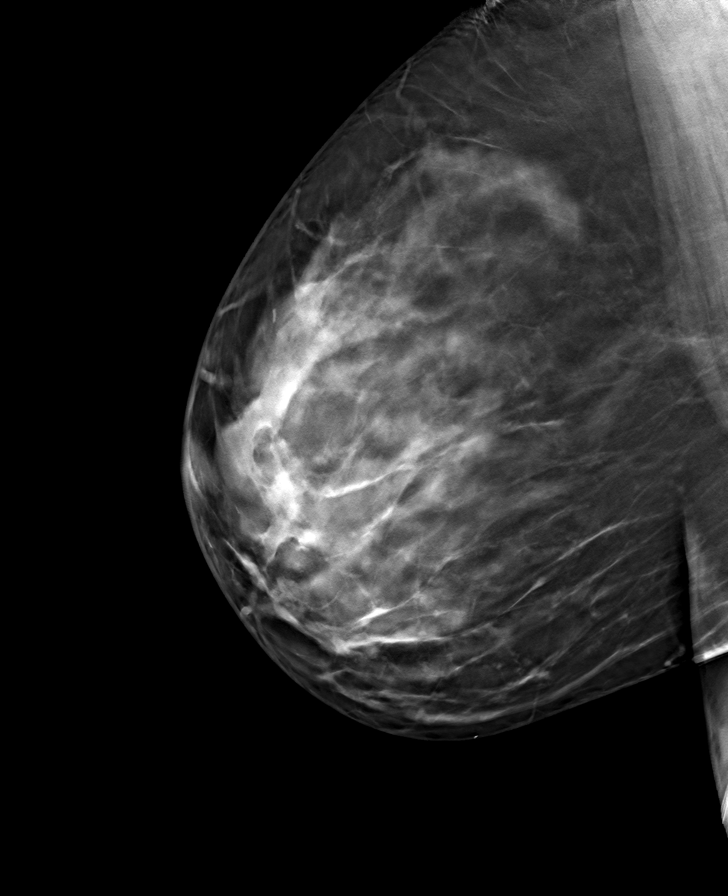

[L CC tomo · tomo slice 44/87.0]
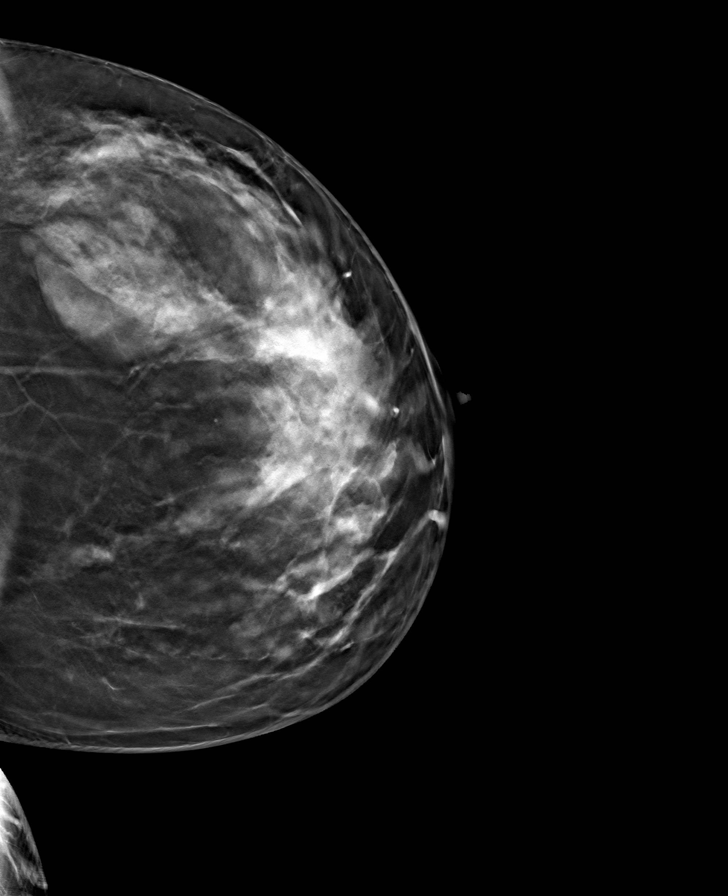

[L MLO tomo · tomo slice 45/89.0]
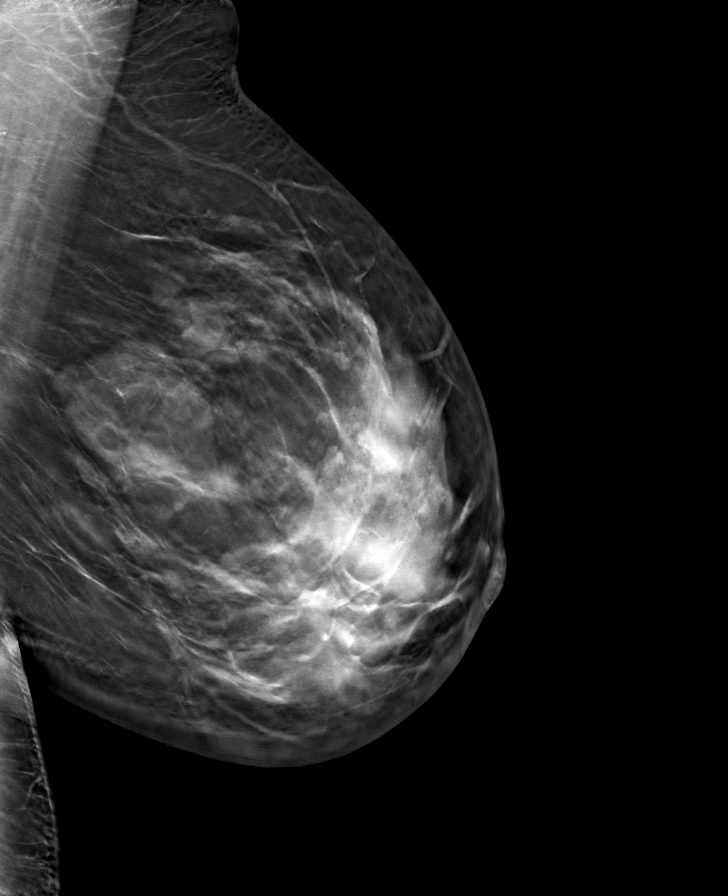

[8 of 24 positions shown; findings below may reference images not displayed]

ACR Breast Density Category c: The breast tissue is heterogeneously
dense, which may obscure small masses.
FINDINGS: The mixed density mass in the upper-outer quadrant of the left
breast is stable compared to the prior exams. It is compatible with
a benign hamartoma. No suspicious mass or malignant type
microcalcifications identified in either breast.
IMPRESSION: Stable probable benign hamartoma in the left breast. No evidence of
malignancy in either breast.

RECOMMENDATION:
Bilateral diagnostic mammogram in 1 year is recommended.

I have discussed the findings and recommendations with the patient.
If applicable, a reminder letter will be sent to the patient
regarding the next appointment.

BI-RADS CATEGORY  3: Probably benign.

ADDENDUM:
This is an addendum to a mammogram done on 02/21/2020. The technique
should say bilateral digital diagnostic mammogram and breast
tomosynthesis was performed.

*** End of Addendum ***
ACR Breast Density Category c: The breast tissue is heterogeneously
dense, which may obscure small masses.
FINDINGS: The mixed density mass in the upper-outer quadrant of the left
breast is stable compared to the prior exams. It is compatible with
a benign hamartoma. No suspicious mass or malignant type
microcalcifications identified in either breast.
IMPRESSION: Stable probable benign hamartoma in the left breast. No evidence of
malignancy in either breast.

RECOMMENDATION:
Bilateral diagnostic mammogram in 1 year is recommended.

I have discussed the findings and recommendations with the patient.
If applicable, a reminder letter will be sent to the patient
regarding the next appointment.

BI-RADS CATEGORY  3: Probably benign.

## 2021-03-20 NOTE — Therapy (Signed)
OUTPATIENT PHYSICAL THERAPY TREATMENT NOTE   Patient Name: Monica Benjamin MRN: [id number] DOB:Jan 09, 1981, 41 y.o., female Today's Date: 03/24/2021  PCP: Leamon Arnt, MD REFERRING PROVIDER: Leamon Arnt, MD   PT End of Session - 03/24/21 0715     Visit Number 4    Number of Visits 13    Date for PT Re-Evaluation 04/12/21    Authorization Type [facility]    PT Start Time 0715    PT Stop Time 9326    PT Time Calculation (min) 43 min    Activity Tolerance Patient tolerated treatment well    Behavior During Therapy [facility] for tasks assessed/performed               Past Medical History:  Diagnosis Date   Allergy    Anxiety    Asthma    Back pain    Depression    GERD (gastroesophageal reflux disease)    Hyperlipidemia    Hypertension    IBS (irritable bowel syndrome)    Infertility, female 02/15/2017   IVF treatment   Lumbar herniated disc    L4-L5   Migraines    Other fatigue    Shortness of breath on exertion    Sleep apnea    Trigeminal neuralgia    Past Surgical History:  Procedure Laterality Date   WISDOM TOOTH EXTRACTION     Patient Active Problem List   Diagnosis Date Noted   Other hyperlipidemia 02/26/2021   Gastroesophageal reflux disease 02/26/2021   Insulin resistance 02/26/2021   Vitamin D deficiency 02/26/2021   OSA (obstructive sleep apnea) 02/26/2021   NAFLD (nonalcoholic fatty liver disease) 02/26/2021   At risk for diabetes mellitus 02/26/2021   Mild episode of recurrent major depressive disorder ([place]) 05/10/2018   Hypertriglyceridemia 05/10/2018   Essential hypertension 03/16/2018   Infertility, female 02/15/2017   OSA on CPAP 05/25/2016   Chronic seasonal allergic rhinitis due to pollen 11/25/2015   GAD (generalized anxiety disorder) 11/25/2015   Gastroesophageal reflux disease without esophagitis 11/25/2015   Irritable bowel syndrome with both constipation and diarrhea 11/25/2015   Moderate persistent asthma without  complication 71/24/5809    REFERRING DIAG: M54.16 (ICD-10-CM) - Radiculopathy, lumbar region   THERAPY DIAG:  Chronic bilateral low back pain without sciatica  Muscle weakness (generalized)  PERTINENT HISTORY: N/A  PRECAUTIONS: None   SUBJECTIVE:  Patient reports her back is feeling a lot better.  PAIN:  Are you having pain? Yes NPRS scale: 1/10 Pain location: low back pain  PAIN TYPE: ache Pain description: intermittent  Aggravating factors: prolonged sitting  Relieving factors: stretching   OBJECTIVE:   *Unless otherwise noted all objective measures were captured on initial evaluation.   DIAGNOSTIC FINDINGS:  Lumbar MRI IMPRESSION: Mild background disc degeneration at L4-L5 with a small left paracentral disc herniation at the left lateral recess. Query Left L5 radiculitis. No spinal or foraminal stenosis.   PATIENT SURVEYS:  FOTO 60% function to 67% predicted             MUSCLE LENGTH: Hamstrings: mild tightness bilaterally, increased LBP with 90/90 on RLE    POSTURE:  Decreased lordosis    PALPATION: Pain and hypomobility PAIVMs L1-L5 Tautness and palpable tenderness lumbar paraspinals  03/13/21: pain and hypomobility PAIVM L1-5.    LUMBARAROM/PROM   A/PROM A/PROM  02/24/2021 03/24/21  Flexion Fingertips 4 inches from floor    Extension 25% limited increased LBP 25% limited; no LBP   Right lateral flexion WNL  Left lateral flexion WNL   Right rotation WNL   Left rotation WNL    (Blank rows = not tested)   LE AROM/PROM:   A/PROM Right 02/24/2021 Left 02/24/2021  Hip flexion      Hip extension      Hip abduction      Hip adduction      Hip internal rotation      Hip external rotation      Knee flexion      Knee extension      Ankle dorsiflexion      Ankle plantarflexion      Ankle inversion      Ankle eversion       (Blank rows = not tested)   LE MMT:   MMT Right 02/24/2021 Left 02/24/2021  Hip flexion 4/5 4-/5 increased LBP  Hip  extension 4-/5 increased LBP 4-/5 increased LBP  Hip abduction 4-/5 increased LBP 4-/5 increased LBP  Hip adduction      Hip internal rotation      Hip external rotation      Knee flexion      Knee extension      Ankle dorsiflexion      Ankle plantarflexion      Ankle inversion      Ankle eversion       (Blank rows = not tested)   LUMBAR SPECIAL TESTS:  (-) SLR    FUNCTIONAL TESTS:  Not tested    GAIT: Distance walked: 20 ft  Assistive device utilized: None Level of assistance: Complete Independence Comments: WNL       TODAY'S TREATMENT  [facility] Adult PT Treatment:                                                DATE: 03/17/21 Therapeutic Exercise: Sidelying thoracic rotation 1 x 10  Seated pelvic tilts on stability ball 2 x10  Resisted shoulder extension seated on stability ball 2 x 15 Dead bug LE only 2 x 10  LTR figure 4 x 60 seconds each  Clamshells 2 x 15; blue band   Manual Therapy: STM/DTM bilateral lumbar paraspinals, bilateral glutes CPAs L1-L5 grade III-IV     [facility] Adult PT Treatment:                                                DATE: 03/13/21 Therapeutic Exercise: Cat/Cow 1 x 10  Seated pelvic tilt 2 x 10  Thread the needle 1 x 10 each  Supine TA march 2 x 10  Bridge with adduction ball squeeze 2 x 10  Manual Therapy: STM/DTM bilateral lumbar paraspinals, bilateral glutes CPAs L1-L5 grade II-III Lumbar manipulation  Trigger Point Dry Needling Treatment: Pre-treatment instruction: Patient instructed on dry needling rationale, procedures, and possible side effects including pain during treatment (achy,cramping feeling), bruising, drop of blood, lightheadedness, nausea, sweating. Patient Consent Given: Yes Education handout provided: Previously provided Muscles treated: bilateral lumbar erector spinae   Treatment response/outcome: Palpable decrease in muscle tension Post-treatment instructions: Patient instructed to expect possible mild to moderate  muscle soreness later today and/or tomorrow. Patient instructed in methods to reduce muscle soreness and to continue prescribed HEP. If patient was dry needled over the lung  field, patient was instructed on signs and symptoms of pneumothorax and, however unlikely, to see immediate medical attention should they occur. Patient was also educated on signs and symptoms of infection and to seek medical attention should they occur. Patient verbalized understanding of these instructions and education.   [facility] Adult PT Treatment:                                                DATE: 03/03/21 Therapeutic Exercise: Cat/Cow x 20  Prone pressup 1 x 10  LTR with figure 4 1 min each  Supine pelvic tilt 2 x 10  Manual Therapy: STM/DTM bilateral lumbar paraspinals, bilateral glutes CPAs L1-L5 grade II-III  Trigger Point Dry Needling Treatment: Pre-treatment instruction: Patient instructed on dry needling rationale, procedures, and possible side effects including pain during treatment (achy,cramping feeling), bruising, drop of blood, lightheadedness, nausea, sweating. Patient Consent Given: Yes Education handout provided: Yes Muscles treated: bilateral lumbar erector spinae; bilateral L3 multifidi   Treatment response/outcome: Twitch response elicited and Palpable decrease in muscle tension Post-treatment instructions: Patient instructed to expect possible mild to moderate muscle soreness later today and/or tomorrow. Patient instructed in methods to reduce muscle soreness and to continue prescribed HEP. If patient was dry needled over the lung field, patient was instructed on signs and symptoms of pneumothorax and, however unlikely, to see immediate medical attention should they occur. Patient was also educated on signs and symptoms of infection and to seek medical attention should they occur. Patient verbalized understanding of these instructions and education.           PATIENT EDUCATION:  Education details:  n/a Person educated: n/a Education method: n/a  Education comprehension: n/a     HOME EXERCISE PROGRAM: Access Code: [email or web address] URL: [email or web address] Date: 02/24/2021 Prepared by: Gwendolyn Grant   Exercises Supine Bridge - 1 x daily - 7 x weekly - 2 sets - 10 reps Sidelying Hip Abduction - 1 x daily - 7 x weekly - 2 sets - 10 reps Supine Lower Trunk Rotation - 2 x daily - 7 x weekly - 1 sets - 10 reps Prone Press Up - 2 x daily - 7 x weekly - 1 sets - 10 reps Cat Cow - 2 x daily - 7 x weekly - 1 sets - 10 reps Supine Hamstring Stretch with Strap - 2 x daily - 7 x weekly - 3 sets - 30 sec hold     ASSESSMENT:   CLINICAL IMPRESSION: Tautness has much reduced about lumbar paraspinals and lumbar mobility has improved. She reports minimal discomfort with lumbar CPAs. Her extension AROM has significantly improved without onset of pain, having met this short term goal. Able to progress trunk mobility and core/hip strengthening today without reports of increased pain and patient demonstrating good lumbopelvic control.    REHAB POTENTIAL: Good   CLINICAL DECISION MAKING: Stable/uncomplicated   EVALUATION COMPLEXITY: Low     GOALS: Goals reviewed with patient? No   SHORT TERM GOALS:   STG Name Target Date Goal status  1 Patient will be independent with initial HEP.  Baseline:  03/10/2021 Achieved   2 Patient will demonstrate pain free lumbar extension AROM to improve ability to reach and lift overhead.  Baseline:  03/17/2021 Achieved     LONG TERM GOALS:    LTG Name Target Date Goal status  1 Patient  will demonstrate at least 4+/5 bilateral hip strength without onset of LBP to improve stability about the chain with prolonged walking.  Baseline: 04/07/2021 INITIAL  2 Patient will demonstrate proper lifting mechanics of at least 30 lbs from floor to waist height to improve her ability to lift her children.  Baseline: 04/07/2021 INITIAL  3 Patient will score at least  67% function on FOTO to signify clinically meaningful improvement in functional abilities.  Baseline: 04/07/2021 INITIAL  4 Patient will be independent with advanced home program to assist in management of her chronic condition.  Baseline: 04/07/2021 INITIAL    PLAN: PT FREQUENCY: 2x/week   PT DURATION: 6 weeks   PLANNED INTERVENTIONS: Therapeutic exercises, Therapeutic activity, Neuro Muscular re-education, Balance training, Patient/Family education, Joint mobilization, Dry Needling, Electrical stimulation, Spinal mobilization, Cryotherapy, Moist heat, Taping, Traction, Ultrasound, and Manual therapy   PLAN FOR NEXT SESSION: , progress lumbar mobility focusing on extension, manual/TPDN to lumbar spine, hip and core strengthening.      Gwendolyn Grant, PT, DPT, ATC 03/24/21 7:58 AM

## 2021-03-24 ENCOUNTER — Ambulatory Visit: Payer: 59

## 2021-03-24 ENCOUNTER — Other Ambulatory Visit: Payer: Self-pay

## 2021-03-24 DIAGNOSIS — M545 Low back pain, unspecified: Secondary | ICD-10-CM

## 2021-03-24 DIAGNOSIS — M6281 Muscle weakness (generalized): Secondary | ICD-10-CM

## 2021-03-24 DIAGNOSIS — G8929 Other chronic pain: Secondary | ICD-10-CM | POA: Diagnosis not present

## 2021-03-25 ENCOUNTER — Other Ambulatory Visit (HOSPITAL_COMMUNITY): Payer: Self-pay

## 2021-03-26 NOTE — Therapy (Incomplete)
OUTPATIENT PHYSICAL THERAPY TREATMENT NOTE   Patient Name: Monica Benjamin MRN: [id number] DOB:1980/09/25, 41 y.o., female Today's Date: 03/26/2021  PCP: Leamon Arnt, MD REFERRING PROVIDER: Suella Broad, MD       Past Medical History:  Diagnosis Date   Allergy    Anxiety    Asthma    Back pain    Depression    GERD (gastroesophageal reflux disease)    Hyperlipidemia    Hypertension    IBS (irritable bowel syndrome)    Infertility, female 02/15/2017   IVF treatment   Lumbar herniated disc    L4-L5   Migraines    Other fatigue    Shortness of breath on exertion    Sleep apnea    Trigeminal neuralgia    Past Surgical History:  Procedure Laterality Date   WISDOM TOOTH EXTRACTION     Patient Active Problem List   Diagnosis Date Noted   Other hyperlipidemia 02/26/2021   Gastroesophageal reflux disease 02/26/2021   Insulin resistance 02/26/2021   Vitamin D deficiency 02/26/2021   OSA (obstructive sleep apnea) 02/26/2021   NAFLD (nonalcoholic fatty liver disease) 02/26/2021   At risk for diabetes mellitus 02/26/2021   Mild episode of recurrent major depressive disorder ([place]) 05/10/2018   Hypertriglyceridemia 05/10/2018   Essential hypertension 03/16/2018   Infertility, female 02/15/2017   OSA on CPAP 05/25/2016   Chronic seasonal allergic rhinitis due to pollen 11/25/2015   GAD (generalized anxiety disorder) 11/25/2015   Gastroesophageal reflux disease without esophagitis 11/25/2015   Irritable bowel syndrome with both constipation and diarrhea 11/25/2015   Moderate persistent asthma without complication 11/19/8525    REFERRING DIAG: M54.16 (ICD-10-CM) - Radiculopathy, lumbar region   THERAPY DIAG:  No diagnosis found.  PERTINENT HISTORY: N/A  PRECAUTIONS: None   SUBJECTIVE:   PAIN:  Are you having pain? Yes NPRS scale: 1/10 Pain location: low back pain  PAIN TYPE: ache Pain description: intermittent  Aggravating factors: prolonged  sitting  Relieving factors: stretching   OBJECTIVE:   *Unless otherwise noted all objective measures were captured on initial evaluation.   DIAGNOSTIC FINDINGS:  Lumbar MRI IMPRESSION: Mild background disc degeneration at L4-L5 with a small left paracentral disc herniation at the left lateral recess. Query Left L5 radiculitis. No spinal or foraminal stenosis.   PATIENT SURVEYS:  FOTO 60% function to 67% predicted             MUSCLE LENGTH: Hamstrings: mild tightness bilaterally, increased LBP with 90/90 on RLE    POSTURE:  Decreased lordosis    PALPATION: Pain and hypomobility PAIVMs L1-L5 [place] and palpable tenderness lumbar paraspinals  03/13/21: pain and hypomobility PAIVM L1-5.    LUMBARAROM/PROM   A/PROM A/PROM  02/24/2021 03/24/21  Flexion Fingertips 4 inches from floor    Extension 25% limited increased LBP 25% limited; no LBP   Right lateral flexion WNL   Left lateral flexion WNL   Right rotation WNL   Left rotation WNL    (Blank rows = not tested)   LE AROM/PROM:   A/PROM Right 02/24/2021 Left 02/24/2021  Hip flexion      Hip extension      Hip abduction      Hip adduction      Hip internal rotation      Hip external rotation      Knee flexion      Knee extension      Ankle dorsiflexion      Ankle plantarflexion  Ankle inversion      Ankle eversion       (Blank rows = not tested)   LE MMT:   MMT Right 02/24/2021 Left 02/24/2021  Hip flexion 4/5 4-/5 increased LBP  Hip extension 4-/5 increased LBP 4-/5 increased LBP  Hip abduction 4-/5 increased LBP 4-/5 increased LBP  Hip adduction      Hip internal rotation      Hip external rotation      Knee flexion      Knee extension      Ankle dorsiflexion      Ankle plantarflexion      Ankle inversion      Ankle eversion       (Blank rows = not tested)   LUMBAR SPECIAL TESTS:  (-) SLR    FUNCTIONAL TESTS:  Not tested    GAIT: Distance walked: 20 ft  Assistive device utilized:  None Level of assistance: Complete Independence Comments: WNL       TODAY'S TREATMENT  [facility] Adult PT Treatment:                                                DATE: 03/27/21 Therapeutic Exercise: *** Manual Therapy: *** Neuromuscular re-ed: *** Therapeutic Activity: *** Modalities: *** Self Care: ***   [facility] Adult PT Treatment:                                                DATE: 03/17/21 Therapeutic Exercise: Sidelying thoracic rotation 1 x 10  Seated pelvic tilts on stability ball 2 x10  Resisted shoulder extension seated on stability ball 2 x 15 Dead bug LE only 2 x 10  LTR figure 4 x 60 seconds each  Clamshells 2 x 15; blue band   Manual Therapy: STM/DTM bilateral lumbar paraspinals, bilateral glutes CPAs L1-L5 grade III-IV     [facility] Adult PT Treatment:                                                DATE: 03/13/21 Therapeutic Exercise: Cat/Cow 1 x 10  Seated pelvic tilt 2 x 10  Thread the needle 1 x 10 each  Supine TA march 2 x 10  Bridge with adduction ball squeeze 2 x 10  Manual Therapy: STM/DTM bilateral lumbar paraspinals, bilateral glutes CPAs L1-L5 grade II-III Lumbar manipulation  Trigger Point Dry Needling Treatment: Pre-treatment instruction: Patient instructed on dry needling rationale, procedures, and possible side effects including pain during treatment (achy,cramping feeling), bruising, drop of blood, lightheadedness, nausea, sweating. Patient Consent Given: Yes Education handout provided: Previously provided Muscles treated: bilateral lumbar erector spinae   Treatment response/outcome: Palpable decrease in muscle tension Post-treatment instructions: Patient instructed to expect possible mild to moderate muscle soreness later today and/or tomorrow. Patient instructed in methods to reduce muscle soreness and to continue prescribed HEP. If patient was dry needled over the lung field, patient was instructed on signs and symptoms of pneumothorax and,  however unlikely, to see immediate medical attention should they occur. Patient was also educated on signs and symptoms of infection and to seek  medical attention should they occur. Patient verbalized understanding of these instructions and education.   [facility] Adult PT Treatment:                                                DATE: 03/03/21 Therapeutic Exercise: Cat/Cow x 20  Prone pressup 1 x 10  LTR with figure 4 1 min each  Supine pelvic tilt 2 x 10  Manual Therapy: STM/DTM bilateral lumbar paraspinals, bilateral glutes CPAs L1-L5 grade II-III  Trigger Point Dry Needling Treatment: Pre-treatment instruction: Patient instructed on dry needling rationale, procedures, and possible side effects including pain during treatment (achy,cramping feeling), bruising, drop of blood, lightheadedness, nausea, sweating. Patient Consent Given: Yes Education handout provided: Yes Muscles treated: bilateral lumbar erector spinae; bilateral L3 multifidi   Treatment response/outcome: Twitch response elicited and Palpable decrease in muscle tension Post-treatment instructions: Patient instructed to expect possible mild to moderate muscle soreness later today and/or tomorrow. Patient instructed in methods to reduce muscle soreness and to continue prescribed HEP. If patient was dry needled over the lung field, patient was instructed on signs and symptoms of pneumothorax and, however unlikely, to see immediate medical attention should they occur. Patient was also educated on signs and symptoms of infection and to seek medical attention should they occur. Patient verbalized understanding of these instructions and education.           PATIENT EDUCATION:  Education details: n/a Person educated: n/a Education method: n/a  Education comprehension: n/a     HOME EXERCISE PROGRAM: Access Code: [email or web address] URL: [email or web address] Date: 02/24/2021 Prepared by: Gwendolyn Grant   Exercises Supine  Bridge - 1 x daily - 7 x weekly - 2 sets - 10 reps Sidelying Hip Abduction - 1 x daily - 7 x weekly - 2 sets - 10 reps Supine Lower Trunk Rotation - 2 x daily - 7 x weekly - 1 sets - 10 reps Prone Press Up - 2 x daily - 7 x weekly - 1 sets - 10 reps Cat Cow - 2 x daily - 7 x weekly - 1 sets - 10 reps Supine Hamstring Stretch with Strap - 2 x daily - 7 x weekly - 3 sets - 30 sec hold     ASSESSMENT:   CLINICAL IMPRESSION:    REHAB POTENTIAL: Good   CLINICAL DECISION MAKING: Stable/uncomplicated   EVALUATION COMPLEXITY: Low     GOALS: Goals reviewed with patient? No   SHORT TERM GOALS:   STG Name Target Date Goal status  1 Patient will be independent with initial HEP.  Baseline:  03/10/2021 Achieved   2 Patient will demonstrate pain free lumbar extension AROM to improve ability to reach and lift overhead.  Baseline:  03/17/2021 Achieved     LONG TERM GOALS:    LTG Name Target Date Goal status  1 Patient will demonstrate at least 4+/5 bilateral hip strength without onset of LBP to improve stability about the chain with prolonged walking.  Baseline: 04/07/2021 INITIAL  2 Patient will demonstrate proper lifting mechanics of at least 30 lbs from floor to waist height to improve her ability to lift her children.  Baseline: 04/07/2021 INITIAL  3 Patient will score at least 67% function on FOTO to signify clinically meaningful improvement in functional abilities.  Baseline: 04/07/2021 INITIAL  4 Patient will be independent with advanced home program to  assist in management of her chronic condition.  Baseline: 04/07/2021 INITIAL    PLAN: PT FREQUENCY: 2x/week   PT DURATION: 6 weeks   PLANNED INTERVENTIONS: Therapeutic exercises, Therapeutic activity, Neuro Muscular re-education, Balance training, Patient/Family education, Joint mobilization, Dry Needling, Electrical stimulation, Spinal mobilization, Cryotherapy, Moist heat, Taping, Traction, Ultrasound, and Manual therapy   PLAN FOR  NEXT SESSION: , progress lumbar mobility focusing on extension, manual/TPDN to lumbar spine, hip and core strengthening.      Gwendolyn Grant, PT, DPT, ATC 03/26/21 9:44 AM

## 2021-03-27 ENCOUNTER — Ambulatory Visit: Payer: 59

## 2021-03-31 ENCOUNTER — Other Ambulatory Visit: Payer: Self-pay

## 2021-03-31 ENCOUNTER — Ambulatory Visit: Payer: 59 | Attending: Physical Medicine and Rehabilitation

## 2021-03-31 DIAGNOSIS — M545 Low back pain, unspecified: Secondary | ICD-10-CM | POA: Insufficient documentation

## 2021-03-31 DIAGNOSIS — M6281 Muscle weakness (generalized): Secondary | ICD-10-CM | POA: Insufficient documentation

## 2021-03-31 DIAGNOSIS — G8929 Other chronic pain: Secondary | ICD-10-CM | POA: Insufficient documentation

## 2021-03-31 NOTE — Therapy (Signed)
OUTPATIENT PHYSICAL THERAPY TREATMENT NOTE   Patient Name: Monica Benjamin MRN: [id number] DOB:Sep 02, 1980, 41 y.o., female Today's Date: 03/31/2021  PCP: Leamon Arnt, MD REFERRING PROVIDER: Leamon Arnt, MD   PT End of Session - 03/31/21 0716     Visit Number 5    Number of Visits 13    Date for PT Re-Evaluation 04/12/21    Authorization Type [facility]    PT Start Time 0716    PT Stop Time 0756    PT Time Calculation (min) 40 min    Activity Tolerance Patient tolerated treatment well    Behavior During Therapy [facility] for tasks assessed/performed                Past Medical History:  Diagnosis Date   Allergy    Anxiety    Asthma    Back pain    Depression    GERD (gastroesophageal reflux disease)    Hyperlipidemia    Hypertension    IBS (irritable bowel syndrome)    Infertility, female 02/15/2017   IVF treatment   Lumbar herniated disc    L4-L5   Migraines    Other fatigue    Shortness of breath on exertion    Sleep apnea    Trigeminal neuralgia    Past Surgical History:  Procedure Laterality Date   WISDOM TOOTH EXTRACTION     Patient Active Problem List   Diagnosis Date Noted   Other hyperlipidemia 02/26/2021   Gastroesophageal reflux disease 02/26/2021   Insulin resistance 02/26/2021   Vitamin D deficiency 02/26/2021   OSA (obstructive sleep apnea) 02/26/2021   NAFLD (nonalcoholic fatty liver disease) 02/26/2021   At risk for diabetes mellitus 02/26/2021   Mild episode of recurrent major depressive disorder ([place]) 05/10/2018   Hypertriglyceridemia 05/10/2018   Essential hypertension 03/16/2018   Infertility, female 02/15/2017   OSA on CPAP 05/25/2016   Chronic seasonal allergic rhinitis due to pollen 11/25/2015   GAD (generalized anxiety disorder) 11/25/2015   Gastroesophageal reflux disease without esophagitis 11/25/2015   Irritable bowel syndrome with both constipation and diarrhea 11/25/2015   Moderate persistent asthma without  complication 68/34/1962    REFERRING DIAG: M54.16 (ICD-10-CM) - Radiculopathy, lumbar region   THERAPY DIAG:  Chronic bilateral low back pain without sciatica  Muscle weakness (generalized)  PERTINENT HISTORY: N/A  PRECAUTIONS: None   SUBJECTIVE:  Patient reports the back is tight from a lot of packing this weekend. She reports compliance with HEP.  PAIN:  Are you having pain? Yes NPRS scale: 4/10 Pain location: low back pain  PAIN TYPE: tightness  Pain description: intermittent  Aggravating factors: prolonged sitting  Relieving factors: stretching   OBJECTIVE:   *Unless otherwise noted all objective measures were captured on initial evaluation.   DIAGNOSTIC FINDINGS:  Lumbar MRI IMPRESSION: Mild background disc degeneration at L4-L5 with a small left paracentral disc herniation at the left lateral recess. Query Left L5 radiculitis. No spinal or foraminal stenosis.   PATIENT SURVEYS:  FOTO 60% function to 67% predicted             MUSCLE LENGTH: Hamstrings: mild tightness bilaterally, increased LBP with 90/90 on RLE    POSTURE:  Decreased lordosis    PALPATION: Pain and hypomobility PAIVMs L1-L5 [place] and palpable tenderness lumbar paraspinals  03/13/21: pain and hypomobility PAIVM L1-5.    LUMBARAROM/PROM   A/PROM A/PROM  02/24/2021 03/24/21  Flexion Fingertips 4 inches from floor    Extension 25% limited increased LBP  25% limited; no LBP   Right lateral flexion WNL   Left lateral flexion WNL   Right rotation WNL   Left rotation WNL    (Blank rows = not tested)   LE AROM/PROM:   A/PROM Right 02/24/2021 Left 02/24/2021  Hip flexion      Hip extension      Hip abduction      Hip adduction      Hip internal rotation      Hip external rotation      Knee flexion      Knee extension      Ankle dorsiflexion      Ankle plantarflexion      Ankle inversion      Ankle eversion       (Blank rows = not tested)   LE MMT:   MMT Right 02/24/2021  Left 02/24/2021 03/31/21  Hip flexion 4/5 4-/5 increased LBP   Hip extension 4-/5 increased LBP 4-/5 increased LBP Lt: 4; Rt: 4-  Hip abduction 4-/5 increased LBP 4-/5 increased LBP   Hip adduction       Hip internal rotation       Hip external rotation       Knee flexion       Knee extension       Ankle dorsiflexion       Ankle plantarflexion       Ankle inversion       Ankle eversion        (Blank rows = not tested)   LUMBAR SPECIAL TESTS:  (-) SLR    FUNCTIONAL TESTS:  Not tested    GAIT: Distance walked: 20 ft  Assistive device utilized: None Level of assistance: Complete Independence Comments: WNL       TODAY'S TREATMENT  [facility] Adult PT Treatment:                                                DATE: 03/31/21 Therapeutic Exercise: Prone pressup 1 x 10  Prone hip extension 2 x 10 bilateral  Seated hip hinge with dowel 2 x 10  Standing hip hinge with dowel 2 x 10  Leg press 3 x 15 @ 60 lbs  Manual Therapy: STM/DTM bilateral lumbar paraspinals, bilateral glutes CPAs L1-L5 grade III-IV  Trigger Point Dry Needling Treatment: Pre-treatment instruction: Patient instructed on dry needling rationale, procedures, and possible side effects including pain during treatment (achy,cramping feeling), bruising, drop of blood, lightheadedness, nausea, sweating. Patient Consent Given: Yes Education handout provided: Previously provided Muscles treated: bilateral lumbar erector spinae   Treatment response/outcome: Twitch response elicited and Palpable decrease in muscle tension Post-treatment instructions: Patient instructed to expect possible mild to moderate muscle soreness later today and/or tomorrow. Patient instructed in methods to reduce muscle soreness and to continue prescribed HEP. If patient was dry needled over the lung field, patient was instructed on signs and symptoms of pneumothorax and, however unlikely, to see immediate medical attention should they occur. Patient was also  educated on signs and symptoms of infection and to seek medical attention should they occur. Patient verbalized understanding of these instructions and education.    [facility] Adult PT Treatment:  DATE: 03/17/21 Therapeutic Exercise: Sidelying thoracic rotation 1 x 10  Seated pelvic tilts on stability ball 2 x10  Resisted shoulder extension seated on stability ball 2 x 15 Dead bug LE only 2 x 10  LTR figure 4 x 60 seconds each  Clamshells 2 x 15; blue band   Manual Therapy: STM/DTM bilateral lumbar paraspinals, bilateral glutes CPAs L1-L5 grade III-IV     [facility] Adult PT Treatment:                                                DATE: 03/13/21 Therapeutic Exercise: Cat/Cow 1 x 10  Seated pelvic tilt 2 x 10  Thread the needle 1 x 10 each  Supine TA march 2 x 10  Bridge with adduction ball squeeze 2 x 10  Manual Therapy: STM/DTM bilateral lumbar paraspinals, bilateral glutes CPAs L1-L5 grade II-III Lumbar manipulation  Trigger Point Dry Needling Treatment: Pre-treatment instruction: Patient instructed on dry needling rationale, procedures, and possible side effects including pain during treatment (achy,cramping feeling), bruising, drop of blood, lightheadedness, nausea, sweating. Patient Consent Given: Yes Education handout provided: Previously provided Muscles treated: bilateral lumbar erector spinae   Treatment response/outcome: Palpable decrease in muscle tension Post-treatment instructions: Patient instructed to expect possible mild to moderate muscle soreness later today and/or tomorrow. Patient instructed in methods to reduce muscle soreness and to continue prescribed HEP. If patient was dry needled over the lung field, patient was instructed on signs and symptoms of pneumothorax and, however unlikely, to see immediate medical attention should they occur. Patient was also educated on signs and symptoms of infection and to seek medical  attention should they occur. Patient verbalized understanding of these instructions and education.       PATIENT EDUCATION:  Education details: n/a Person educated: n/a Education method: n/a  Education comprehension: n/a     HOME EXERCISE PROGRAM: Access Code: [email or web address] URL: [email or web address] Date: 02/24/2021 Prepared by: Gwendolyn Grant   Exercises Supine Bridge - 1 x daily - 7 x weekly - 2 sets - 10 reps Sidelying Hip Abduction - 1 x daily - 7 x weekly - 2 sets - 10 reps Supine Lower Trunk Rotation - 2 x daily - 7 x weekly - 1 sets - 10 reps Prone Press Up - 2 x daily - 7 x weekly - 1 sets - 10 reps Cat Cow - 2 x daily - 7 x weekly - 1 sets - 10 reps Supine Hamstring Stretch with Strap - 2 x daily - 7 x weekly - 3 sets - 30 sec hold     ASSESSMENT:   CLINICAL IMPRESSION: Increased tautness and palpable tenderness noted about bilateral lumbar paraspinals compared to previous session. TPDN performed to paraspinals with reduced tautness noted following intervention. With prone hip extension patient exhibits delayed gluteal firing bilaterally (hamstring dominant) requiring tactile and verbal cues for proper engagement. With continued practice she is able to properly engage her glutes. Began addressing lifting/bending mechanics with patient having initial difficulty performing hip hinge as she has tendency to perform excessive lumbar flexion. No increase in low back pain/tightness throughout session.   REHAB POTENTIAL: Good   CLINICAL DECISION MAKING: Stable/uncomplicated   EVALUATION COMPLEXITY: Low     GOALS: Goals reviewed with patient? No   SHORT TERM GOALS:   STG Name Target Date Goal status  1 Patient will be  independent with initial HEP.  Baseline:  03/10/2021 Achieved   2 Patient will demonstrate pain free lumbar extension AROM to improve ability to reach and lift overhead.  Baseline:  03/17/2021 Achieved     LONG TERM GOALS:    LTG Name Target  Date Goal status  1 Patient will demonstrate at least 4+/5 bilateral hip strength without onset of LBP to improve stability about the chain with prolonged walking.  Baseline: 04/07/2021 INITIAL  2 Patient will demonstrate proper lifting mechanics of at least 30 lbs from floor to waist height to improve her ability to lift her children.  Baseline: 04/07/2021 INITIAL  3 Patient will score at least 67% function on FOTO to signify clinically meaningful improvement in functional abilities.  Baseline: 04/07/2021 INITIAL  4 Patient will be independent with advanced home program to assist in management of her chronic condition.  Baseline: 04/07/2021 INITIAL    PLAN: PT FREQUENCY: 2x/week   PT DURATION: 6 weeks   PLANNED INTERVENTIONS: Therapeutic exercises, Therapeutic activity, Neuro Muscular re-education, Balance training, Patient/Family education, Joint mobilization, Dry Needling, Electrical stimulation, Spinal mobilization, Cryotherapy, Moist heat, Taping, Traction, Ultrasound, and Manual therapy   PLAN FOR NEXT SESSION: , progress lumbar mobility focusing on extension, manual/TPDN to lumbar spine, hip and core strengthening focusing on glutes; capture Harolyn Rutherford, PT, DPT, ATC 03/31/21 7:57 AM

## 2021-04-03 ENCOUNTER — Ambulatory Visit (INDEPENDENT_AMBULATORY_CARE_PROVIDER_SITE_OTHER): Payer: 59 | Admitting: Family Medicine

## 2021-04-03 ENCOUNTER — Ambulatory Visit: Payer: 59 | Admitting: Physical Therapy

## 2021-04-03 ENCOUNTER — Other Ambulatory Visit: Payer: Self-pay

## 2021-04-03 ENCOUNTER — Encounter (INDEPENDENT_AMBULATORY_CARE_PROVIDER_SITE_OTHER): Payer: Self-pay | Admitting: Family Medicine

## 2021-04-03 VITALS — BP 115/80 | HR 64 | Temp 97.7°F | Ht 64.0 in | Wt 202.0 lb

## 2021-04-03 DIAGNOSIS — I10 Essential (primary) hypertension: Secondary | ICD-10-CM

## 2021-04-03 DIAGNOSIS — E669 Obesity, unspecified: Secondary | ICD-10-CM | POA: Diagnosis not present

## 2021-04-03 DIAGNOSIS — E66811 Obesity, class 1: Secondary | ICD-10-CM

## 2021-04-03 DIAGNOSIS — F39 Unspecified mood [affective] disorder: Secondary | ICD-10-CM

## 2021-04-03 DIAGNOSIS — E559 Vitamin D deficiency, unspecified: Secondary | ICD-10-CM | POA: Diagnosis not present

## 2021-04-03 DIAGNOSIS — Z6834 Body mass index (BMI) 34.0-34.9, adult: Secondary | ICD-10-CM | POA: Diagnosis not present

## 2021-04-03 DIAGNOSIS — Z9189 Other specified personal risk factors, not elsewhere classified: Secondary | ICD-10-CM

## 2021-04-07 ENCOUNTER — Other Ambulatory Visit: Payer: Self-pay

## 2021-04-07 ENCOUNTER — Ambulatory Visit: Payer: 59

## 2021-04-07 DIAGNOSIS — M6281 Muscle weakness (generalized): Secondary | ICD-10-CM | POA: Diagnosis not present

## 2021-04-07 DIAGNOSIS — M545 Low back pain, unspecified: Secondary | ICD-10-CM | POA: Diagnosis not present

## 2021-04-07 DIAGNOSIS — G8929 Other chronic pain: Secondary | ICD-10-CM | POA: Diagnosis not present

## 2021-04-07 NOTE — Therapy (Signed)
OUTPATIENT PHYSICAL THERAPY TREATMENT NOTE   Patient Name: Monica Benjamin MRN: [id number] DOB:[birth year], 41 y.o., female Today's Date: 04/07/2021  PCP: Leamon Arnt, MD REFERRING PROVIDER: Leamon Arnt, MD   PT End of Session - 04/07/21 0717     Visit Number 6    Number of Visits 13    Date for PT Re-Evaluation 04/12/21    Authorization Type [facility]    PT Start Time 0716    PT Stop Time 0756    PT Time Calculation (min) 40 min    Activity Tolerance Patient tolerated treatment well    Behavior During Therapy [facility] for tasks assessed/performed                Past Medical History:  Diagnosis Date   Allergy    Anxiety    Asthma    Back pain    Depression    GERD (gastroesophageal reflux disease)    Hyperlipidemia    Hypertension    IBS (irritable bowel syndrome)    Infertility, female 02/15/2017   IVF treatment   Lumbar herniated disc    L4-L5   Migraines    Other fatigue    Shortness of breath on exertion    Sleep apnea    Trigeminal neuralgia    Past Surgical History:  Procedure Laterality Date   WISDOM TOOTH EXTRACTION     Patient Active Problem List   Diagnosis Date Noted   Other hyperlipidemia 02/26/2021   Gastroesophageal reflux disease 02/26/2021   Insulin resistance 02/26/2021   Vitamin D deficiency 02/26/2021   OSA (obstructive sleep apnea) 02/26/2021   NAFLD (nonalcoholic fatty liver disease) 02/26/2021   At risk for diabetes mellitus 02/26/2021   Mild episode of recurrent major depressive disorder ([place]) 05/10/2018   Hypertriglyceridemia 05/10/2018   Essential hypertension 03/16/2018   Infertility, female 02/15/2017   OSA on CPAP 05/25/2016   Chronic seasonal allergic rhinitis due to pollen 11/25/2015   GAD (generalized anxiety disorder) 11/25/2015   Gastroesophageal reflux disease without esophagitis 11/25/2015   Irritable bowel syndrome with both constipation and diarrhea 11/25/2015   Moderate persistent asthma without  complication 21/11/5518    REFERRING DIAG: M54.16 (ICD-10-CM) - Radiculopathy, lumbar region   THERAPY DIAG:  Chronic bilateral low back pain without sciatica  Muscle weakness (generalized)  PERTINENT HISTORY: N/A  PRECAUTIONS: None   SUBJECTIVE:  Patient reports she is feeling pretty good today. "Better than last week."  PAIN:  Are you having pain? No NPRS scale: 0/10 NPRS worst scale: 7/10  Pain location: low back pain  PAIN TYPE: tightness  Pain description: intermittent  Aggravating factors: prolonged sitting; overuse   Relieving factors: stretching   OBJECTIVE:   *Unless otherwise noted all objective measures were captured on initial evaluation.   DIAGNOSTIC FINDINGS:  Lumbar MRI IMPRESSION: Mild background disc degeneration at L4-L5 with a small left paracentral disc herniation at the left lateral recess. Query Left L5 radiculitis. No spinal or foraminal stenosis.   PATIENT SURVEYS:  FOTO 60% function to 67% predicted 04/07/21: 69% function              MUSCLE LENGTH: Hamstrings: mild tightness bilaterally, increased LBP with 90/90 on RLE    POSTURE:  Decreased lordosis    PALPATION: Pain and hypomobility PAIVMs L1-L5 [place] and palpable tenderness lumbar paraspinals  03/13/21: pain and hypomobility PAIVM L1-5.    LUMBARAROM/PROM   A/PROM A/PROM  02/24/2021 03/24/21  Flexion Fingertips 4 inches from floor  Extension 25% limited increased LBP 25% limited; no LBP   Right lateral flexion WNL   Left lateral flexion WNL   Right rotation WNL   Left rotation WNL    (Blank rows = not tested)   LE AROM/PROM:   A/PROM Right 02/24/2021 Left 02/24/2021  Hip flexion      Hip extension      Hip abduction      Hip adduction      Hip internal rotation      Hip external rotation      Knee flexion      Knee extension      Ankle dorsiflexion      Ankle plantarflexion      Ankle inversion      Ankle eversion       (Blank rows = not tested)   LE  MMT:   MMT Right 02/24/2021 Left 02/24/2021 03/31/21  Hip flexion 4/5 4-/5 increased LBP   Hip extension 4-/5 increased LBP 4-/5 increased LBP Lt: 4; Rt: 4-  Hip abduction 4-/5 increased LBP 4-/5 increased LBP   Hip adduction       Hip internal rotation       Hip external rotation       Knee flexion       Knee extension       Ankle dorsiflexion       Ankle plantarflexion       Ankle inversion       Ankle eversion        (Blank rows = not tested)   LUMBAR SPECIAL TESTS:  (-) SLR    FUNCTIONAL TESTS:  Not tested         TODAY'S TREATMENT  [facility] Adult PT Treatment:                                                DATE: 04/07/21 Therapeutic Exercise: Stability ball rollout 2 minutes; 3 way  Seated pelvic tilts on stability ball 2 x 10  Wall walks up with stability ball x 10  Dead lift to 8 inch step with 5 lb kettlebell 2 x 10  Paloff press green band 2 x 10  Monster walks 2 sets d/b x 20 ft green band at shins  Dead bug 2 x 10  Sidelying hip cirlces 2 x 10 bilateral     [facility] Adult PT Treatment:                                                DATE: 03/31/21 Therapeutic Exercise: Prone pressup 1 x 10  Prone hip extension 2 x 10 bilateral  Seated hip hinge with dowel 2 x 10  Standing hip hinge with dowel 2 x 10  Leg press 3 x 15 @ 60 lbs  Manual Therapy: STM/DTM bilateral lumbar paraspinals, bilateral glutes CPAs L1-L5 grade III-IV  Trigger Point Dry Needling Treatment: Pre-treatment instruction: Patient instructed on dry needling rationale, procedures, and possible side effects including pain during treatment (achy,cramping feeling), bruising, drop of blood, lightheadedness, nausea, sweating. Patient Consent Given: Yes Education handout provided: Previously provided Muscles treated: bilateral lumbar erector spinae   Treatment response/outcome: Twitch response elicited and Palpable decrease in muscle tension Post-treatment instructions: Patient  instructed to expect possible  mild to moderate muscle soreness later today and/or tomorrow. Patient instructed in methods to reduce muscle soreness and to continue prescribed HEP. If patient was dry needled over the lung field, patient was instructed on signs and symptoms of pneumothorax and, however unlikely, to see immediate medical attention should they occur. Patient was also educated on signs and symptoms of infection and to seek medical attention should they occur. Patient verbalized understanding of these instructions and education.    [facility] Adult PT Treatment:                                                DATE: 03/17/21 Therapeutic Exercise: Sidelying thoracic rotation 1 x 10  Seated pelvic tilts on stability ball 2 x10  Resisted shoulder extension seated on stability ball 2 x 15 Dead bug LE only 2 x 10  LTR figure 4 x 60 seconds each  Clamshells 2 x 15; blue band   Manual Therapy: STM/DTM bilateral lumbar paraspinals, bilateral glutes CPAs L1-L5 grade III-IV          PATIENT EDUCATION:  Education details: FOTO score  Person educated: patient  Education method: instruction  Education comprehension: verbalized understanding      HOME EXERCISE PROGRAM: Access Code: [email or web address] URL: [email or web address] Date: 02/24/2021 Prepared by: Gwendolyn Grant   Exercises Supine Bridge - 1 x daily - 7 x weekly - 2 sets - 10 reps Sidelying Hip Abduction - 1 x daily - 7 x weekly - 2 sets - 10 reps Supine Lower Trunk Rotation - 2 x daily - 7 x weekly - 1 sets - 10 reps Prone Press Up - 2 x daily - 7 x weekly - 1 sets - 10 reps Cat Cow - 2 x daily - 7 x weekly - 1 sets - 10 reps Supine Hamstring Stretch with Strap - 2 x daily - 7 x weekly - 3 sets - 30 sec hold     ASSESSMENT:   CLINICAL IMPRESSION: Patient tolerated session well today with progression of lifting activity and dynamic core stabilization. She demonstrates appropriate mechanics with dead lifting and no reports of pain. Her FOTO score has  much improved having met this long term functional goal.   REHAB POTENTIAL: Good   CLINICAL DECISION MAKING: Stable/uncomplicated   EVALUATION COMPLEXITY: Low     GOALS: Goals reviewed with patient? No   SHORT TERM GOALS:   STG Name Target Date Goal status  1 Patient will be independent with initial HEP.  Baseline:  03/10/2021 Achieved   2 Patient will demonstrate pain free lumbar extension AROM to improve ability to reach and lift overhead.  Baseline:  03/17/2021 Achieved     LONG TERM GOALS:    LTG Name Target Date Goal status  1 Patient will demonstrate at least 4+/5 bilateral hip strength without onset of LBP to improve stability about the chain with prolonged walking.  Baseline: 04/07/2021 INITIAL  2 Patient will demonstrate proper lifting mechanics of at least 30 lbs from floor to waist height to improve her ability to lift her children.  Baseline: 04/07/2021 INITIAL  3 Patient will score at least 67% function on FOTO to signify clinically meaningful improvement in functional abilities.  Baseline: 04/07/2021 Achieved   4 Patient will be independent with advanced home program to assist in management of her chronic condition.  Baseline: 04/07/2021 INITIAL    PLAN: PT FREQUENCY: 2x/week   PT DURATION: 6 weeks   PLANNED INTERVENTIONS: Therapeutic exercises, Therapeutic activity, Neuro Muscular re-education, Balance training, Patient/Family education, Joint mobilization, Dry Needling, Electrical stimulation, Spinal mobilization, Cryotherapy, Moist heat, Taping, Traction, Ultrasound, and Manual therapy   PLAN FOR NEXT SESSION: , progress lumbar mobility focusing on extension, manual/TPDN to lumbar spine, hip and core strengthening focusing on glutes     Gwendolyn Grant, PT, DPT, ATC 04/07/21 7:57 AM

## 2021-04-07 NOTE — Progress Notes (Signed)
? ? ? ?Chief Complaint:  ? ?OBESITY ?Monica Benjamin is here to discuss her progress with her obesity treatment plan along with follow-up of her obesity related diagnoses. Monica Benjamin is on the Category 1 Plan with breakfast options and states she is following her eating plan approximately 85-90% of the time. Monica Benjamin states she is walking for 30 minutes 2-3 times per week. ? ?Today's visit was #: 4 ?Starting weight: 201 lbs ?Starting date: 02/12/2021 ?Today's weight: 202 lbs ?Today's date: 04/03/2021 ?Total lbs lost to date: 0 ?Total lbs lost since last in-office visit: 1 lb ? ?Interim History: Monica Benjamin and her family have moved out of their home.  They are getting it painted and are living with her mom now.  No kitchen of their own and life is in limbo right now.   ?-Not able to eat all the protein, "it's just too much". ? ? ?Subjective:  ? ?1. Essential hypertension ?Monica Benjamin is on HCTZ 25 mg daily.  Review: taking medications as instructed, no medication side effects noted, no chest pain on exertion, no dyspnea on exertion, no swelling of ankles.  ? ?2. Vitamin D deficiency ?She is currently taking prescription vitamin D 50,000 IU each week. She denies nausea, vomiting or muscle weakness. ? ?3. Mood disorder ([place]) with emotional eating ?Monica Benjamin says she is doing remarkably well despite stressors.  She says she is even making healthy choices for snacks. ? ?4. At risk for malnutrition ?Monica Benjamin is at increased risk for malnutrition due to deficient protein intake.  ? ? ?Assessment/Plan:  ? ?1. Essential hypertension ?At goal.  Continue HCTZ and decrease salt intake.  Monica Benjamin is working on healthy weight loss and exercise to improve blood pressure control. We will watch for signs of hypotension as she continues her lifestyle modifications. ? ?2. Vitamin D deficiency ?Low Vitamin D level contributes to fatigue and are associated with obesity, breast, and colon cancer. She agrees to continue to take prescription Vitamin D  '@50'$ ,000 IU every week and will follow-up for routine testing of Vitamin D, at least 2-3 times per year to avoid over-replacement.  She does not need a refill today. ? ?3. Mood disorder ([place]) with emotional eating ?Continue Trintellix.   ?Stress management was discussed with her today.  ?Discussed cues and consequences, how thoughts affect eating, model of thoughts, feelings, and behaviors, and strategies for change by focusing on the cue. Discussed cognitive distortions, coping thoughts, and how to change your thoughts ? ?Reminded patient importance of:   ?- engaging in regular exercise ?- seek counseling if needed ?- follow a prudent nutrition plan  ?- get a proper amount of sleep  ?all to support her emotional wellbeing.  ? ?- Will continue to monitor closely alongside PCP / specialists  ? ?4. At risk for malnutrition ?Monica Benjamin was given approximately 9 minutes of counseling today regarding prevention of malnutrition and ways to meet macronutrient goals. ? ?5. Obesity with current BMI of 34.8 ? ?Monica Benjamin is currently in the action stage of change. As such, her goal is to continue with weight loss efforts. She has agreed to the Category 1 Plan with breakfast and lunch options & protein equivalents.  ? ?Added protein equivalents  ( and discussed ways to get all her proteins in each day)  and lunch options to the meal plan today. ? ?Exercise goals:  As is. ? ?Behavioral modification strategies: increasing lean protein intake and better snacking choices. ? ?Monica Benjamin has agreed to follow-up with our clinic in 2 weeks with Monica Benjamin  Monica Holm, PA-C. She was informed of the importance of frequent follow-up visits to maximize her success with intensive lifestyle modifications for her multiple health conditions.  ? ?Objective:  ? ?Blood pressure 115/80, pulse 64, temperature 97.7 ?F (36.5 ?C), temperature source Oral, height '5\' 4"'$  (1.626 m), weight 202 lb (91.6 kg), SpO2 99 %. ?Body mass index is 34.67 kg/m?. ? ?General:  Cooperative, alert, well developed, in no acute distress. ?HEENT: Conjunctivae and lids unremarkable. ?Cardiovascular: Regular rhythm.  ?Lungs: Normal work of breathing. ?Neurologic: No focal deficits.  ? ?Lab Results  ?Component Value Date  ? CREATININE 0.87 02/12/2021  ? BUN 6 02/12/2021  ? NA 139 02/12/2021  ? K 3.6 02/12/2021  ? CL 101 02/12/2021  ? CO2 22 02/12/2021  ? ?Lab Results  ?Component Value Date  ? ALT 40 (H) 02/12/2021  ? AST 49 (H) 02/12/2021  ? ALKPHOS 135 (H) 02/12/2021  ? BILITOT 0.5 02/12/2021  ? ?Lab Results  ?Component Value Date  ? HGBA1C 5.4 02/12/2021  ? ?Lab Results  ?Component Value Date  ? INSULIN 6.6 02/12/2021  ? ?Lab Results  ?Component Value Date  ? TSH 1.670 02/12/2021  ? ?Lab Results  ?Component Value Date  ? CHOL 195 02/12/2021  ? HDL 38 (L) 02/12/2021  ? LDLCALC 121 (H) 02/12/2021  ? LDLDIRECT 137.0 03/16/2018  ? TRIG 205 (H) 02/12/2021  ? CHOLHDL 4.9 11/29/2019  ? ?Lab Results  ?Component Value Date  ? VD25OH 24.3 (L) 02/12/2021  ? VD25OH 27 (L) 05/18/2014  ? ?Lab Results  ?Component Value Date  ? WBC 10.8 02/12/2021  ? HGB 14.6 02/12/2021  ? HCT 42.9 02/12/2021  ? MCV 89 02/12/2021  ? PLT 311 02/12/2021  ? ?Attestation Statements:  ? ?Reviewed by clinician on day of visit: allergies, medications, problem list, medical history, surgical history, family history, social history, and previous encounter notes. ? ?I, Water quality scientist, CMA, am acting as transcriptionist for [employer], DO. ? ?I have reviewed the above documentation for accuracy and completeness, and I agree with the above. Marjory Sneddon, D.O. ? ?The [place] was signed into law in 2016 which includes the topic of electronic health records.  This provides immediate access to information in MyChart.  This includes consultation notes, operative notes, office notes, lab results and pathology reports.  If you have any questions about what you read please let us know at your next visit so we can discuss  your concerns and take corrective action if need be.  We are right here with you. ? ?

## 2021-04-10 ENCOUNTER — Encounter: Payer: Self-pay | Admitting: Obstetrics and Gynecology

## 2021-04-10 ENCOUNTER — Ambulatory Visit (INDEPENDENT_AMBULATORY_CARE_PROVIDER_SITE_OTHER): Payer: 59 | Admitting: Obstetrics and Gynecology

## 2021-04-10 ENCOUNTER — Other Ambulatory Visit: Payer: Self-pay

## 2021-04-10 ENCOUNTER — Ambulatory Visit: Payer: 59

## 2021-04-10 VITALS — BP 124/68 | HR 64 | Ht 63.75 in | Wt 205.0 lb

## 2021-04-10 DIAGNOSIS — Z01419 Encounter for gynecological examination (general) (routine) without abnormal findings: Secondary | ICD-10-CM | POA: Diagnosis not present

## 2021-04-10 NOTE — Progress Notes (Signed)
41 y.o. G81P0000 Married Caucasian female here for annual exam.   ? ?Followed for benign hamartoma of the left breast.  ?Was released back to yearly mammograms. ?Skin itches over the area on her breast.  ? ?Has some headaches around the time of her cycle, but not every time.  ?It is manageable.  ?If she vomits, she feels better.  ? ?Going to health weight management.  ?Taking vit D supplement now.  ? ?Son is 19 month old.  Her wife carried the pregnancy.  ?Daughter is almost 5.  ? ?PCP:  Clinton Gallant ? ?Patient's last menstrual period was 03/11/2021 (exact date).     ?Period Cycle (Days): 30 ?Period Duration (Days): 3 ?Period Pattern: Regular ?Menstrual Flow: Moderate ?Menstrual Control: Tampon ?Dysmenorrhea:  (1 day of moderate cramping) ?    ?Sexually active: Yes.    ?The current method of family planning is female partner.    ?Exercising: Yes.     Walks 30 minutes/day twice a week ?Smoker:  no ? ?Health Maintenance: ?Pap:  04-01-20 Neg:Neg HR HPV, 10/17/18 Neg,  05/27/15 Neg:Neg HR HPV ?History of abnormal Pap:  no ?MMG:  02-21-21 partially fatty asymmetry in Lt.Br.;consistent w/benign hamartoma/Neg/Birads2/screening 75yr?Colonoscopy:  done a few years ago, normal with Dr. MCollene Mares  ?BMD:   n/a  Result  n/a ?TDaP:  10-31-13 ?Gardasil:   yes, completed ?HIV: 05-24-15 NR ?Hep C:no ?Screening Labs:  PCP ?Covid series:  completed.  ? ? reports that she has never smoked. She has never used smokeless tobacco. She reports that she does not currently use alcohol. She reports that she does not use drugs. ? ?Past Medical History:  ?Diagnosis Date  ? Allergy   ? Anxiety   ? Asthma   ? Back pain   ? Depression   ? GERD (gastroesophageal reflux disease)   ? Hyperlipidemia   ? Hypertension   ? IBS (irritable bowel syndrome)   ? Infertility, female 02/15/2017  ? IVF treatment  ? Lumbar herniated disc   ? L4-L5  ? Migraines   ? Other fatigue   ? Shortness of breath on exertion   ? Sleep apnea   ? Trigeminal neuralgia   ? Vitamin D  deficiency 01/26/2021  ? ? ?Past Surgical History:  ?Procedure Laterality Date  ? WISDOM TOOTH EXTRACTION    ? ? ?Current Outpatient Medications  ?Medication Sig Dispense Refill  ? albuterol (VENTOLIN HFA) 108 (90 Base) MCG/ACT inhaler Inhale 2 puffs into the lungs every 4 (four) hours as needed (cough, shortness of breath or wheezing.). 18 g 1  ? budesonide-formoterol (SYMBICORT) 80-4.5 MCG/ACT inhaler Inhale 2 puffs into the lungs 2 (two) times daily. 10.2 g 3  ? cetirizine (ZYRTEC) 10 MG tablet Take 10 mg by mouth daily.    ? clonazePAM (KLONOPIN) 0.5 MG tablet Take 0.5-1 tablets (0.25-0.5 mg total) by mouth daily as needed for anxiety 20 tablet 1  ? hydrochlorothiazide (HYDRODIURIL) 25 MG tablet Take 1 tablet (25 mg total) by mouth daily. 90 tablet 3  ? Omega-3 Fatty Acids (FISH OIL) 1000 MG CAPS Take 1 capsule by mouth daily.    ? pantoprazole (PROTONIX) 20 MG tablet Take 1 tablet (20 mg total) by mouth daily. 90 tablet 3  ? Vitamin D, Ergocalciferol, (DRISDOL) 1.25 MG (50000 UNIT) CAPS capsule Take 1 capsule (50,000 Units total) by mouth every 7 (seven) days. 4 capsule 0  ? vortioxetine HBr (TRINTELLIX) 10 MG TABS tablet Take 1 tablet (10 mg total) by mouth daily. 9[place]  tablet 3  ? ?No current facility-administered medications for this visit.  ? ? ?Family History  ?Problem Relation Age of Onset  ? Anxiety disorder Mother   ? Osteoporosis Mother   ? Migraines Mother   ? Alcoholism Father   ? Hyperlipidemia Father   ? Hypertension Father   ? Migraines Sister   ? Dementia Maternal Grandmother   ? Diabetes Maternal Grandfather   ? COPD Paternal Grandmother   ? Anxiety disorder Other   ? Alcoholism Other   ? Cancer Neg Hx   ? Heart disease Neg Hx   ? ? ?Review of Systems  ?All other systems reviewed and are negative. ? ?Exam:   ?BP 124/68   Pulse 64   Ht 5' 3.75" (1.619 m)   Wt 205 lb (93 kg)   LMP 03/11/2021 (Exact Date)   SpO2 (!) 68%   BMI 35.46 kg/m?     ?General appearance: alert, cooperative and appears  stated age ?Head: normocephalic, without obvious abnormality, atraumatic ?Neck: no adenopathy, supple, symmetrical, trachea midline and thyroid normal to inspection and palpation ?Lungs: clear to auscultation bilaterally ?Breasts: normal appearance, no masses or tenderness, No nipple retraction or dimpling, No nipple discharge or bleeding, No axillary adenopathy ?Heart: regular rate and rhythm ?Abdomen: soft, non-tender; no masses, no organomegaly ?Extremities: extremities normal, atraumatic, no cyanosis or edema ?Skin: skin color, texture, turgor normal. No rashes or lesions ?Lymph nodes: cervical, supraclavicular, and axillary nodes normal. ?Neurologic: grossly normal ? ?Pelvic: External genitalia:  no lesions ?             No abnormal inguinal nodes palpated. ?             Urethra:  normal appearing urethra with no masses, tenderness or lesions ?             Bartholins and Skenes: normal    ?             Vagina: normal appearing vagina with normal color and discharge, no lesions ?             Cervix: no lesions ?             Pap taken: no ?Bimanual Exam:  Uterus:  normal size, contour, position, consistency, mobility, non-tender ?             Adnexa: no mass, fullness, tenderness ?             Rectal exam: yes.  Confirms. ?             Anus:  normal sphincter tone, no lesions ? ?Chaperone was present for exam:  Estill Bamberg, CMA ? ?Assessment:   ?Well woman visit with gynecologic exam. ?Probable left breast benign hamartoma.  Followed with imaging. No prior biopsy.  ?Breast skin pruritis.  ?Hypercholesterolemia and hypertriglyceridemia.  ?NSAID allergy. ? ?Plan: ?Mammogram screening discussed. ?Self breast awareness reviewed. ?Pap and HR HPV as above. ?Guidelines for Calcium, Vitamin D, regular exercise program including cardiovascular and weight bearing exercise. ?Try Benadryl cream for breast skin pruritis prn.   ?Follow up annually and prn.  ? ?After visit summary provided.  ? ? ? ?

## 2021-04-10 NOTE — Patient Instructions (Signed)

## 2021-04-14 ENCOUNTER — Ambulatory Visit: Payer: 59

## 2021-04-14 ENCOUNTER — Ambulatory Visit (INDEPENDENT_AMBULATORY_CARE_PROVIDER_SITE_OTHER): Payer: 59 | Admitting: Physician Assistant

## 2021-04-14 NOTE — Therapy (Incomplete)
?OUTPATIENT PHYSICAL THERAPY TREATMENT NOTE/RE-CERTIFICATION ? ? ?Patient Name: Monica Benjamin ?MRN: [id number] ?DOB:[birth year], 41 y.o., female ?Today's Date: 04/14/2021 ? ?PCP: Leamon Arnt, MD ?REFERRING PROVIDER: Suella Broad, MD ? ? ? ? ? ? ? ?Past Medical History:  ?Diagnosis Date  ? Allergy   ? Anxiety   ? Asthma   ? Back pain   ? Depression   ? GERD (gastroesophageal reflux disease)   ? Hyperlipidemia   ? Hypertension   ? IBS (irritable bowel syndrome)   ? Infertility, female 02/15/2017  ? IVF treatment  ? Lumbar herniated disc   ? L4-L5  ? Migraines   ? Other fatigue   ? Shortness of breath on exertion   ? Sleep apnea   ? Trigeminal neuralgia   ? Vitamin D deficiency 01/26/2021  ? ?Past Surgical History:  ?Procedure Laterality Date  ? WISDOM TOOTH EXTRACTION    ? ?Patient Active Problem List  ? Diagnosis Date Noted  ? Other hyperlipidemia 02/26/2021  ? Gastroesophageal reflux disease 02/26/2021  ? Insulin resistance 02/26/2021  ? Vitamin D deficiency 02/26/2021  ? OSA (obstructive sleep apnea) 02/26/2021  ? NAFLD (nonalcoholic fatty liver disease) 02/26/2021  ? At risk for diabetes mellitus 02/26/2021  ? Mild episode of recurrent major depressive disorder ([place]) 05/10/2018  ? Hypertriglyceridemia 05/10/2018  ? Essential hypertension 03/16/2018  ? Infertility, female 02/15/2017  ? OSA on CPAP 05/25/2016  ? Chronic seasonal allergic rhinitis due to pollen 11/25/2015  ? GAD (generalized anxiety disorder) 11/25/2015  ? Gastroesophageal reflux disease without esophagitis 11/25/2015  ? Irritable bowel syndrome with both constipation and diarrhea 11/25/2015  ? Moderate persistent asthma without complication 94/85/4627  ? ? ?REFERRING DIAG: M54.16 (ICD-10-CM) - Radiculopathy, lumbar region  ? ?THERAPY DIAG:  ?No diagnosis found. ? ?PERTINENT HISTORY: N/A ? ?PRECAUTIONS: None  ? ?SUBJECTIVE:  ? ? ?PAIN:  ?Are you having pain? No ?NPRS scale: 0/10 ?NPRS worst scale: 7/10  ?Pain location: low back pain  ?PAIN  TYPE: tightness  ?Pain description: intermittent  ?Aggravating factors: prolonged sitting; overuse   ?Relieving factors: stretching  ? ?OBJECTIVE:  ? *Unless otherwise noted all objective measures were captured on initial evaluation.  ? ?DIAGNOSTIC FINDINGS:  ?Lumbar MRI IMPRESSION: ?Mild background disc degeneration at L4-L5 with a small left ?paracentral disc herniation at the left lateral recess. Query Left ?L5 radiculitis. No spinal or foraminal stenosis. ?  ?PATIENT SURVEYS:  ?FOTO 60% function to 67% predicted ?04/07/21: 69% function  ?  ?          ?MUSCLE LENGTH: ?Hamstrings: mild tightness bilaterally, increased LBP with 90/90 on RLE  ?  ?POSTURE:  ?Decreased lordosis  ?  ?PALPATION: ?Pain and hypomobility PAIVMs L1-L5 ?[place] and palpable tenderness lumbar paraspinals ? ?03/13/21: pain and hypomobility PAIVM L1-5.  ?04/14/21: ?  ?LUMBARAROM/PROM ?  ?A/PROM A/PROM  ?02/24/2021 03/24/21 04/14/21  ?Flexion Fingertips 4 inches from floor     ?Extension 25% limited increased LBP 25% limited; no LBP    ?Right lateral flexion WNL    ?Left lateral flexion WNL    ?Right rotation WNL    ?Left rotation WNL    ? (Blank rows = not tested) ?  ?LE AROM/PROM: ?  ?A/PROM Right ?02/24/2021 Left ?02/24/2021  ?Hip flexion      ?Hip extension      ?Hip abduction      ?Hip adduction      ?Hip internal rotation      ?Hip external rotation      ?  Knee flexion      ?Knee extension      ?Ankle dorsiflexion      ?Ankle plantarflexion      ?Ankle inversion      ?Ankle eversion      ? (Blank rows = not tested) ?  ?LE MMT: ?  ?MMT Right ?02/24/2021 Left ?02/24/2021 03/31/21 04/14/21  ?Hip flexion 4/5 4-/5 increased LBP    ?Hip extension 4-/5 increased LBP 4-/5 increased LBP Lt: 4; Rt: 4-   ?Hip abduction 4-/5 increased LBP 4-/5 increased LBP    ?Hip adduction        ?Hip internal rotation        ?Hip external rotation        ?Knee flexion        ?Knee extension        ?Ankle dorsiflexion        ?Ankle plantarflexion        ?Ankle inversion         ?Ankle eversion        ? (Blank rows = not tested) ?  ?LUMBAR SPECIAL TESTS:  ?(-) SLR  ?  ?FUNCTIONAL TESTS:  ?Not tested  ? ?  ?  ?  ?TODAY'S TREATMENT  ?[facility] Adult PT Treatment:                                                DATE: 04/14/21 ?Therapeutic Exercise: ?*** ?Manual Therapy: ?*** ?Neuromuscular re-ed: ?*** ?Therapeutic Activity: ?Re-assessment to determine overall progress.  ? ? ? ?[facility] Adult PT Treatment:                                                DATE: 04/07/21 ?Therapeutic Exercise: ?Stability ball rollout 2 minutes; 3 way  ?Seated pelvic tilts on stability ball 2 x 10  ?Wall walks up with stability ball x 10  ?Dead lift to 8 inch step with 5 lb kettlebell 2 x 10  ?Paloff press green band 2 x 10  ?Monster walks 2 sets d/b x 20 ft green band at shins  ?Dead bug 2 x 10  ?Sidelying hip cirlces 2 x 10 bilateral  ? ? ? ?[facility] Adult PT Treatment:                                                DATE: 03/31/21 ?Therapeutic Exercise: ?Prone pressup 1 x 10  ?Prone hip extension 2 x 10 bilateral  ?Seated hip hinge with dowel 2 x 10  ?Standing hip hinge with dowel 2 x 10  ?Leg press 3 x 15 @ 60 lbs  ?Manual Therapy: ?STM/DTM bilateral lumbar paraspinals, bilateral glutes ?CPAs L1-L5 grade III-IV  ?Trigger Point Dry Needling Treatment: ?Pre-treatment instruction: Patient instructed on dry needling rationale, procedures, and possible side effects including pain during treatment (achy,cramping feeling), bruising, drop of blood, lightheadedness, nausea, sweating. ?Patient Consent Given: Yes ?Education handout provided: Previously provided ?Muscles treated: bilateral lumbar erector spinae   ?Treatment response/outcome: Twitch response elicited and Palpable decrease in muscle tension ?Post-treatment instructions: Patient instructed to expect possible mild to moderate muscle soreness  later today and/or tomorrow. Patient instructed in methods to reduce muscle soreness and to continue prescribed HEP. If patient was dry  needled over the lung field, patient was instructed on signs and symptoms of pneumothorax and, however unlikely, to see immediate medical attention should they occur. Patient was also educated on signs and symptoms of infection and to seek medical attention should they occur. Patient verbalized understanding of these instructions and education. ? ? ? ? ? ?  ?  ?PATIENT EDUCATION:  ?Education details: see treatment  ?Person educated: patient  ?Education method: instruction  ?Education comprehension: verbalized understanding  ?  ?  ?HOME EXERCISE PROGRAM: ?Access Code: [email or web address] ?URL: [email or web address] ?Date: 02/24/2021 ?Prepared by: Gwendolyn Grant ?  ?Exercises ?Supine Bridge - 1 x daily - 7 x weekly - 2 sets - 10 reps ?Sidelying Hip Abduction - 1 x daily - 7 x weekly - 2 sets - 10 reps ?Supine Lower Trunk Rotation - 2 x daily - 7 x weekly - 1 sets - 10 reps ?Prone Press Up - 2 x daily - 7 x weekly - 1 sets - 10 reps ?Cat Cow - 2 x daily - 7 x weekly - 1 sets - 10 reps ?Supine Hamstring Stretch with Strap - 2 x daily - 7 x weekly - 3 sets - 30 sec hold ?  ?  ?ASSESSMENT: ?  ?CLINICAL IMPRESSION: ? ? ?REHAB POTENTIAL: Good ?  ?CLINICAL DECISION MAKING: Stable/uncomplicated ?  ?EVALUATION COMPLEXITY: Low ?  ?  ?GOALS: ?Goals reviewed with patient? No ?  ?SHORT TERM GOALS: ?  ?STG Name Target Date Goal status  ?1 Patient will be independent with initial HEP.  ?Baseline:  03/10/2021 Achieved   ?2 Patient will demonstrate pain free lumbar extension AROM to improve ability to reach and lift overhead.  ?Baseline:  03/17/2021 Achieved   ?  ?LONG TERM GOALS:  ?  ?LTG Name Target Date Goal status  ?1 Patient will demonstrate at least 4+/5 bilateral hip strength without onset of LBP to improve stability about the chain with prolonged walking.  ?Baseline: 04/07/2021 INITIAL  ?2 Patient will demonstrate proper lifting mechanics of at least 30 lbs from floor to waist height to improve her ability to lift her children.   ?Baseline: 04/07/2021 INITIAL  ?3 Patient will score at least 67% function on FOTO to signify clinically meaningful improvement in functional abilities.  ?Baseline: 04/07/2021 Achieved   ?4 Patient will be

## 2021-04-24 ENCOUNTER — Other Ambulatory Visit (INDEPENDENT_AMBULATORY_CARE_PROVIDER_SITE_OTHER): Payer: Self-pay | Admitting: Physician Assistant

## 2021-04-24 ENCOUNTER — Other Ambulatory Visit (HOSPITAL_COMMUNITY): Payer: Self-pay

## 2021-04-24 ENCOUNTER — Other Ambulatory Visit: Payer: Self-pay | Admitting: Family Medicine

## 2021-04-24 DIAGNOSIS — E559 Vitamin D deficiency, unspecified: Secondary | ICD-10-CM

## 2021-04-25 ENCOUNTER — Other Ambulatory Visit (HOSPITAL_COMMUNITY): Payer: Self-pay

## 2021-04-25 MED ORDER — CLONAZEPAM 0.5 MG PO TABS
0.2500 mg | ORAL_TABLET | Freq: Every day | ORAL | 1 refills | Status: DC
  Filled 2021-04-25: qty 20, 20d supply, fill #0
  Filled 2021-07-23: qty 20, 20d supply, fill #1

## 2021-04-28 ENCOUNTER — Ambulatory Visit (INDEPENDENT_AMBULATORY_CARE_PROVIDER_SITE_OTHER): Payer: 59 | Admitting: Family Medicine

## 2021-04-28 ENCOUNTER — Other Ambulatory Visit (HOSPITAL_COMMUNITY): Payer: Self-pay

## 2021-04-28 ENCOUNTER — Encounter (INDEPENDENT_AMBULATORY_CARE_PROVIDER_SITE_OTHER): Payer: Self-pay | Admitting: Family Medicine

## 2021-04-28 VITALS — BP 127/89 | HR 71 | Temp 98.0°F | Ht 64.0 in | Wt 203.0 lb

## 2021-04-28 DIAGNOSIS — Z9189 Other specified personal risk factors, not elsewhere classified: Secondary | ICD-10-CM

## 2021-04-28 DIAGNOSIS — I10 Essential (primary) hypertension: Secondary | ICD-10-CM

## 2021-04-28 DIAGNOSIS — F39 Unspecified mood [affective] disorder: Secondary | ICD-10-CM | POA: Diagnosis not present

## 2021-04-28 DIAGNOSIS — E669 Obesity, unspecified: Secondary | ICD-10-CM

## 2021-04-28 DIAGNOSIS — Z6834 Body mass index (BMI) 34.0-34.9, adult: Secondary | ICD-10-CM | POA: Diagnosis not present

## 2021-04-28 DIAGNOSIS — E66811 Obesity, class 1: Secondary | ICD-10-CM

## 2021-04-28 DIAGNOSIS — E559 Vitamin D deficiency, unspecified: Secondary | ICD-10-CM | POA: Diagnosis not present

## 2021-04-28 MED ORDER — VITAMIN D (ERGOCALCIFEROL) 1.25 MG (50000 UNIT) PO CAPS
50000.0000 [IU] | ORAL_CAPSULE | ORAL | 0 refills | Status: DC
  Filled 2021-04-28: qty 4, 28d supply, fill #0

## 2021-04-30 NOTE — Progress Notes (Signed)
? ? ? ?Chief Complaint:  ? ?OBESITY ?Monica Benjamin is here to discuss her progress with her obesity treatment plan along with follow-up of her obesity related diagnoses. Monica Benjamin is on the Category 1 Plan with breakfast and lunch options + protein equivalents and states she is following her eating plan approximately 50% of the time. Monica Benjamin states she is walking for 30 minutes 2-3 times per week. ? ?Today's visit was #: 5 ?Starting weight: 201 lbs ?Starting date: 02/12/2021 ?Today's weight: 203 lbs ?Today's date: 04/28/2021 ?Total lbs lost to date: 0 ?Total lbs lost since last in-office visit: 0 ? ?Interim History: Monica Benjamin is showing their home, and she is trying to sell it and it is currently under contract. She notes increased stress and not meal prepping. ? ?Subjective:  ? ?1. Essential hypertension ?Monica Benjamin's at home blood pressures are in the 130's/85-90. She is having some headache on and off but  she thinks it is due to stress. ? ?2. Vitamin D deficiency ?Monica Benjamin is currently taking prescription vitamin D 50,000 IU each week. She denies nausea, vomiting or muscle weakness. ? ?3. Mood disorder (Monica Benjamin) with emotional eating ?Monica Benjamin's emotional eating is not bad, and she is sleeping well. She is on Klonopin and Trintellix.  ? ?4. At risk for dehydration ?Monica Benjamin is at risk for dehydration due to increased salt intake. ? ?Assessment/Plan:  ?No orders of the defined types were placed in this encounter. ? ? ?Medications Discontinued During This Encounter  ?Medication Reason  ? Vitamin D, Ergocalciferol, (DRISDOL) 1.25 MG (50000 UNIT) CAPS capsule Reorder  ?  ? ?Meds ordered this encounter  ?Medications  ? Vitamin D, Ergocalciferol, (DRISDOL) 1.25 MG (50000 UNIT) CAPS capsule  ?  Sig: Take 1 capsule (50,000 Units total) by mouth every 7 (seven) days.  ?  Dispense:  4 capsule  ?  Refill:  0  ?  30 d supply;  OV for RF Do not send RF request  ?  ? ?1. Essential hypertension ?[place] declines the need for change in her  blood pressure medications for now. Will closely monitor and check at home. We will change regimen at her next office visit if needed.  ? ?2. Vitamin D deficiency ?- I again reiterated the importance of vitamin D (as well as calcium) to their health and wellbeing.  ?- I reviewed possible symptoms of low Vitamin D:  low energy, depressed mood, muscle aches, joint aches, osteoporosis etc. ?- low Vitamin D levels may be linked to an increased risk of cardiovascular events and even increased risk of cancers- such as colon and breast.  ?- ideal vitamin D levels reviewed with patient  ?- I recommend pt take a weekly prescription vit D - see script below   ?- Informed patient this may be a lifelong thing, and she was encouraged to continue to take the medicine until told otherwise.    ?- weight loss will likely improve availability of vitamin D, thus encouraged [place] to continue with meal plan and their weight loss efforts to further improve this condition.  Thus, we will need to monitor levels regularly (every 3-4 mo on average) to keep levels within normal limits and prevent over supplementation. ?- pt's questions and concerns regarding this condition addressed. ? ?- Vitamin D, Ergocalciferol, (DRISDOL) 1.25 MG (50000 UNIT) CAPS capsule; Take 1 capsule (50,000 Units total) by mouth every 7 (seven) days.  Dispense: 4 capsule; Refill: 0 ? ?3. Mood disorder ([place]) with emotional eating ?Monica Benjamin is to use CALM app q  day, increase exercise, and continue her medications. ? ?4. At risk for dehydration ?Monica Benjamin was given approximately 9 minutes of dehydration prevention counseling today. Monica Benjamin is at risk for dehydration due to weight loss and current medication(s). She was encouraged to hydrate and monitor fluid status to avoid dehydration as weight loss plateaus. ? ?5. Obesity with current BMI of 34.9 ?Monica Benjamin is currently in the action stage of change. As such, her goal is to continue with weight loss efforts. She has  agreed to the Category 1 Plan with breakfast and lunch options + protein equivalents.  ? ?Exercise goals: As is. ? ?Behavioral modification strategies: increasing lean protein intake, increasing vegetables, and planning for success. ? ?Monica Benjamin has agreed to follow-up with our clinic in 3 weeks. She was informed of the importance of frequent follow-up visits to maximize her success with intensive lifestyle modifications for her multiple health conditions.  ? ?Objective:  ? ?Blood pressure 127/89, pulse 71, temperature 98 ?F (36.7 ?C), height '5\' 4"'$  (1.626 m), weight 203 lb (92.1 kg), SpO2 95 %. ?Body mass index is 34.84 kg/m?. ? ?General: Cooperative, alert, well developed, in no acute distress. ?HEENT: Conjunctivae and lids unremarkable. ?Cardiovascular: Regular rhythm.  ?Lungs: Normal work of breathing. ?Neurologic: No focal deficits.  ? ?Lab Results  ?Component Value Date  ? CREATININE 0.87 02/12/2021  ? BUN 6 02/12/2021  ? NA 139 02/12/2021  ? K 3.6 02/12/2021  ? CL 101 02/12/2021  ? CO2 22 02/12/2021  ? ?Lab Results  ?Component Value Date  ? ALT 40 (H) 02/12/2021  ? AST 49 (H) 02/12/2021  ? ALKPHOS 135 (H) 02/12/2021  ? BILITOT 0.5 02/12/2021  ? ?Lab Results  ?Component Value Date  ? HGBA1C 5.4 02/12/2021  ? ?Lab Results  ?Component Value Date  ? INSULIN 6.6 02/12/2021  ? ?Lab Results  ?Component Value Date  ? TSH 1.670 02/12/2021  ? ?Lab Results  ?Component Value Date  ? CHOL 195 02/12/2021  ? HDL 38 (L) 02/12/2021  ? LDLCALC 121 (H) 02/12/2021  ? LDLDIRECT 137.0 03/16/2018  ? TRIG 205 (H) 02/12/2021  ? CHOLHDL 4.9 11/29/2019  ? ?Lab Results  ?Component Value Date  ? VD25OH 24.3 (L) 02/12/2021  ? VD25OH 27 (L) 05/18/2014  ? ?Lab Results  ?Component Value Date  ? WBC 10.8 02/12/2021  ? HGB 14.6 02/12/2021  ? HCT 42.9 02/12/2021  ? MCV 89 02/12/2021  ? PLT 311 02/12/2021  ? ?No results found for: IRON, TIBC, FERRITIN ? ?Attestation Statements:  ? ?Reviewed by clinician on day of visit: allergies, medications,  problem list, medical history, surgical history, family history, social history, and previous encounter notes. ? ? ?I, Trixie Dredge, am acting as transcriptionist for [employer], DO. ? ?I have reviewed the above documentation for accuracy and completeness, and I agree with the above. Marjory Sneddon, D.O. ? ?The [place] was signed into law in 2016 which includes the topic of electronic health records.  This provides immediate access to information in MyChart.  This includes consultation notes, operative notes, office notes, lab results and pathology reports.  If you have any questions about what you read please let us know at your next visit so we can discuss your concerns and take corrective action if need be.  We are right here with you. ? ? ?

## 2021-05-06 ENCOUNTER — Other Ambulatory Visit (HOSPITAL_COMMUNITY): Payer: Self-pay

## 2021-05-22 ENCOUNTER — Other Ambulatory Visit (HOSPITAL_COMMUNITY): Payer: Self-pay

## 2021-05-22 ENCOUNTER — Ambulatory Visit (INDEPENDENT_AMBULATORY_CARE_PROVIDER_SITE_OTHER): Payer: 59 | Admitting: Physician Assistant

## 2021-05-22 ENCOUNTER — Encounter (INDEPENDENT_AMBULATORY_CARE_PROVIDER_SITE_OTHER): Payer: Self-pay | Admitting: Physician Assistant

## 2021-05-22 VITALS — BP 134/92 | HR 54 | Temp 97.8°F | Ht 64.0 in | Wt 204.0 lb

## 2021-05-22 DIAGNOSIS — E559 Vitamin D deficiency, unspecified: Secondary | ICD-10-CM | POA: Diagnosis not present

## 2021-05-22 DIAGNOSIS — E669 Obesity, unspecified: Secondary | ICD-10-CM

## 2021-05-22 DIAGNOSIS — Z6834 Body mass index (BMI) 34.0-34.9, adult: Secondary | ICD-10-CM

## 2021-05-22 DIAGNOSIS — Z9189 Other specified personal risk factors, not elsewhere classified: Secondary | ICD-10-CM

## 2021-05-22 MED ORDER — VITAMIN D (ERGOCALCIFEROL) 1.25 MG (50000 UNIT) PO CAPS
50000.0000 [IU] | ORAL_CAPSULE | ORAL | 0 refills | Status: DC
  Filled 2021-05-22: qty 4, 28d supply, fill #0

## 2021-05-26 NOTE — Progress Notes (Signed)
? ? ? ?Chief Complaint:  ? ?OBESITY ?Monica Benjamin is here to discuss her progress with her obesity treatment plan along with follow-up of her obesity related diagnoses. Monica Benjamin is on the Category 1 Plan with breakfast and lunch and protein equivalents and states she is following her eating plan approximately 50% of the time. Monica Benjamin states she is walking for 30 minutes 2 times per week. ? ?Today's visit was #: 6 ?Starting weight: 201 lbs ?Starting date: 02/12/2021 ?Today's weight: 204 lbs ?Today's date: 05/22/2021 ?Total lbs lost to date: 0 ?Total lbs lost since last in-office visit: 0 ? ?Interim History: Monica Benjamin has been stressed with kids and work. She is not eating enough protein with breakfast on some days. Her lunch is on plan. She states dinner is a struggle due to cooking for her young children. Healthy, convenience options were given today.  ? ?Subjective:  ? ?1. Vitamin D deficiency ?Monica Benjamin is currently taking Vitamin D weekly and she is taking as prescribed.  ? ?2. At risk for impaired metabolic function ?Monica Benjamin is at risk for impaired metabolic function due to not eating enough protein.  ? ?Assessment/Plan:  ? ?1. Vitamin D deficiency ?Low Vitamin D level contributes to fatigue and are associated with obesity, breast, and colon cancer. We will refill prescription Vitamin D 50,000 IU every week for 1 month with no refills and Monica Benjamin will follow-up for routine testing of Vitamin D, at least 2-3 times per year to avoid over-replacement. ? ?- Vitamin D, Ergocalciferol, (DRISDOL) 1.25 MG (50000 UNIT) CAPS capsule; Take 1 capsule (50,000 Units total) by mouth every 7 (seven) days.  Dispense: 4 capsule; Refill: 0 ? ?2. At risk for impaired metabolic function ?Monica Benjamin was given approximately 15 minutes of impaired  metabolic function prevention counseling today. We discussed intensive lifestyle modifications today with an emphasis on specific nutrition and exercise instructions and strategies.  ? ?Repetitive  spaced learning was employed today to elicit superior memory formation and behavioral change.  ? ?3. Obesity with current BMI of 34.9 ?Monica Benjamin is currently in the action stage of change. As such, her goal is to continue with weight loss efforts. She has agreed to keeping a food journal and adhering to recommended goals of 1100-1200 calories and 90 grams of protein daily.  ? ?We discussed Wegovy as an option.  ? ?Exercise goals:  As is.  ? ?Behavioral modification strategies: meal planning and cooking strategies. ? ?Monica Benjamin has agreed to follow-up with our clinic in 3 weeks. She was informed of the importance of frequent follow-up visits to maximize her success with intensive lifestyle modifications for her multiple health conditions.  ? ?Objective:  ? ?Blood pressure (!) 134/92, pulse (!) 54, temperature 97.8 ?F (36.6 ?C), height '5\' 4"'$  (1.626 m), weight 204 lb (92.5 kg), SpO2 98 %. ?Body mass index is 35.02 kg/m?. ? ?General: Cooperative, alert, well developed, in no acute distress. ?HEENT: Conjunctivae and lids unremarkable. ?Cardiovascular: Regular rhythm.  ?Lungs: Normal work of breathing. ?Neurologic: No focal deficits.  ? ?Lab Results  ?Component Value Date  ? CREATININE 0.87 02/12/2021  ? BUN 6 02/12/2021  ? NA 139 02/12/2021  ? K 3.6 02/12/2021  ? CL 101 02/12/2021  ? CO2 22 02/12/2021  ? ?Lab Results  ?Component Value Date  ? ALT 40 (H) 02/12/2021  ? AST 49 (H) 02/12/2021  ? ALKPHOS 135 (H) 02/12/2021  ? BILITOT 0.5 02/12/2021  ? ?Lab Results  ?Component Value Date  ? HGBA1C 5.4 02/12/2021  ? ?Lab Results  ?  Component Value Date  ? INSULIN 6.6 02/12/2021  ? ?Lab Results  ?Component Value Date  ? TSH 1.670 02/12/2021  ? ?Lab Results  ?Component Value Date  ? CHOL 195 02/12/2021  ? HDL 38 (L) 02/12/2021  ? LDLCALC 121 (H) 02/12/2021  ? LDLDIRECT 137.0 03/16/2018  ? TRIG 205 (H) 02/12/2021  ? CHOLHDL 4.9 11/29/2019  ? ?Lab Results  ?Component Value Date  ? VD25OH 24.3 (L) 02/12/2021  ? VD25OH 27 (L) 05/18/2014   ? ?Lab Results  ?Component Value Date  ? WBC 10.8 02/12/2021  ? HGB 14.6 02/12/2021  ? HCT 42.9 02/12/2021  ? MCV 89 02/12/2021  ? PLT 311 02/12/2021  ? ?No results found for: IRON, TIBC, FERRITIN ? ?Attestation Statements:  ? ?Reviewed by clinician on day of visit: allergies, medications, problem list, medical history, surgical history, family history, social history, and previous encounter notes. ? ?I, Monica Benjamin, am acting as Location manager for [employer], PA-C. ? ?I have reviewed the above documentation for accuracy and completeness, and I agree with the above. Monica Potash, PA-C ? ?

## 2021-06-19 ENCOUNTER — Other Ambulatory Visit (HOSPITAL_COMMUNITY): Payer: Self-pay

## 2021-06-19 ENCOUNTER — Ambulatory Visit (INDEPENDENT_AMBULATORY_CARE_PROVIDER_SITE_OTHER): Payer: 59 | Admitting: Family Medicine

## 2021-06-19 VITALS — BP 140/90 | HR 57 | Temp 98.3°F | Ht 64.0 in | Wt 206.0 lb

## 2021-06-19 DIAGNOSIS — E559 Vitamin D deficiency, unspecified: Secondary | ICD-10-CM | POA: Diagnosis not present

## 2021-06-19 DIAGNOSIS — Z9189 Other specified personal risk factors, not elsewhere classified: Secondary | ICD-10-CM

## 2021-06-19 DIAGNOSIS — E669 Obesity, unspecified: Secondary | ICD-10-CM

## 2021-06-19 DIAGNOSIS — I10 Essential (primary) hypertension: Secondary | ICD-10-CM | POA: Diagnosis not present

## 2021-06-19 DIAGNOSIS — Z6835 Body mass index (BMI) 35.0-35.9, adult: Secondary | ICD-10-CM | POA: Diagnosis not present

## 2021-06-19 MED ORDER — VITAMIN D (ERGOCALCIFEROL) 1.25 MG (50000 UNIT) PO CAPS
50000.0000 [IU] | ORAL_CAPSULE | ORAL | 0 refills | Status: DC
  Filled 2021-06-19: qty 4, 28d supply, fill #0

## 2021-06-24 ENCOUNTER — Encounter (INDEPENDENT_AMBULATORY_CARE_PROVIDER_SITE_OTHER): Payer: Self-pay | Admitting: Family Medicine

## 2021-06-25 ENCOUNTER — Other Ambulatory Visit (HOSPITAL_COMMUNITY): Payer: Self-pay

## 2021-07-01 NOTE — Progress Notes (Signed)
Chief Complaint:   OBESITY Monica Benjamin is here to discuss her progress with her obesity treatment plan along with follow-up of her obesity related diagnoses. Monica Benjamin is on keeping a food journal and adhering to recommended goals of 1100-1200 calories and 90 grams protein and states she is following her eating plan approximately 80% of the time. Monica Benjamin states she is walking and cycling 30 minutes 5 times per week.  Today's visit was #: 7 Starting weight: 201 lbs Starting date: 02/12/2021 Today's weight: 206 lbs Today's date: 06/19/2021 Total lbs lost to date: 0 Total lbs lost since last in-office visit: +2  Interim History: Monica Benjamin started logging on [employer] and is not hitting goals. She endorses being under in calories often and not hitting protein goals. She is drinking 30-40 oz water per day.  Subjective:   1. Essential hypertension Pt forgot to take meds this morning. She is on HCTZ. Pt is asymptomatic and denies concerns.  2. Vitamin D deficiency Monica Benjamin P Ramesh is tolerating medication(s) well without side effects. Medication compliance is good as patient endorses taking it as prescribed. The patient denies additional concerns regarding this condition.      3. At risk for dehydration Monica Benjamin is at risk for dehydration due to inadequate intake.   Assessment/Plan:  No orders of the defined types were placed in this encounter.   Medications Discontinued During This Encounter  Medication Reason   Vitamin D, Ergocalciferol, (DRISDOL) 1.25 MG (50000 UNIT) CAPS capsule Reorder     Meds ordered this encounter  Medications   Vitamin D, Ergocalciferol, (DRISDOL) 1.25 MG (50000 UNIT) CAPS capsule    Sig: Take 1 capsule (50,000 Units total) by mouth every 7 (seven) days.    Dispense:  4 capsule    Refill:  0    30 d supply;  OV for RF Do not send RF request     1. Essential hypertension Monica Benjamin is working on healthy weight loss and exercise to improve blood pressure  control. We will watch for signs of hypotension as she continues her lifestyle modifications.  BP not at goal today and is poorly controlled. Discussed with pt getting a routine and taking her BP meds every morning when she brushes her teeth, for instance. Decrease salt intake and increase water intake.  2. Vitamin D deficiency Low Vitamin D level contributes to fatigue and are associated with obesity, breast, and colon cancer. She agrees to continue to take prescription Vitamin D '@50'$ ,000 IU every week and will follow-up for routine testing of Vitamin D, at least 2-3 times per year to avoid over-replacement.  Refill- Vitamin D, Ergocalciferol, (DRISDOL) 1.25 MG (50000 UNIT) CAPS capsule; Take 1 capsule (50,000 Units total) by mouth every 7 (seven) days.  Dispense: 4 capsule; Refill: 0  3. At risk for dehydration Monica Benjamin is at higher than average risk of dehydration. Monica Benjamin was given more than 9 minutes of proper hydration counseling today. We discussed the signs and symptoms of dehydration, some of which may include muscle cramping, constipation or even orthostatic symptoms. Counseling on the prevention of dehydration was also provided today. Monica Benjamin is at risk for dehydration due to weight loss, lifestyle and behavorial habits and possibly due to taking certain medication(s). She was encouraged to adequately hydrate and monitor fluid status to avoid dehydration as well as weight loss plateaus. Unless pre-existing renal or cardiopulmonary conditions exist, in which patient was told to limit their fluid intake, I recommended roughly one half of their weight in pounds  to be the approximate ounces of non-caloric, non-caffeinated beverages they should drink per day; including more if they are engaging in exercise.  Monica Benjamin is at higher than average risk of dehydration. [place] was given more than 9 minutes of proper hydration counseling today. We discussed the signs and symptoms of dehydration, some of  which may include muscle cramping, constipation, or even orthostatic symptoms. Counseling on the prevention of dehydration was also provided today. Monica Benjamin is at risk for dehydration due to weight loss, lifestyle and behavorial habits, and possibly due to taking certain medication(s). She was encouraged to adequately hydrate and monitor fluid status to avoid dehydration as well as weight loss plateaus. Unless pre-existing renal or cardiopulmonary conditions exist, which pt was told to limit their fluid intake. I recommended roughly one half of their weight in pounds to be the approximate ounces of non-caloric, non-caffeinated beverages they should drink per day; including more if they are engaging in exercise.  4. Obesity with current BMI of 35.4 Monica Benjamin is currently in the action stage of change. As such, her goal is to continue with weight loss efforts. She has agreed to keeping a food journal and adhering to recommended goals of 1200 calories and 100+ grams protein.   Increase water intake to 100 oz per day. Also log totals to help keep yourself accountable.  Exercise goals:  As is  Behavioral modification strategies: increasing water intake and keeping a strict food journal.  Monica Benjamin has agreed to follow-up with our clinic in 3 weeks. She was informed of the importance of frequent follow-up visits to maximize her success with intensive lifestyle modifications for her multiple health conditions.   Objective:   Blood pressure 140/90, pulse (!) 57, temperature 98.3 F (36.8 C), height '5\' 4"'$  (1.626 m), weight 206 lb (93.4 kg), SpO2 98 %. Body mass index is 35.36 kg/m.  General: Cooperative, alert, well developed, in no acute distress. HEENT: Conjunctivae and lids unremarkable. Cardiovascular: Regular rhythm.  Lungs: Normal work of breathing. Neurologic: No focal deficits.   Lab Results  Component Value Date   CREATININE 0.87 02/12/2021   BUN 6 02/12/2021   NA 139 02/12/2021   K 3.6  02/12/2021   CL 101 02/12/2021   CO2 22 02/12/2021   Lab Results  Component Value Date   ALT 40 (H) 02/12/2021   AST 49 (H) 02/12/2021   ALKPHOS 135 (H) 02/12/2021   BILITOT 0.5 02/12/2021   Lab Results  Component Value Date   HGBA1C 5.4 02/12/2021   Lab Results  Component Value Date   INSULIN 6.6 02/12/2021   Lab Results  Component Value Date   TSH 1.670 02/12/2021   Lab Results  Component Value Date   CHOL 195 02/12/2021   HDL 38 (L) 02/12/2021   LDLCALC 121 (H) 02/12/2021   LDLDIRECT 137.0 03/16/2018   TRIG 205 (H) 02/12/2021   CHOLHDL 4.9 11/29/2019   Lab Results  Component Value Date   VD25OH 24.3 (L) 02/12/2021   VD25OH 27 (L) 05/18/2014   Lab Results  Component Value Date   WBC 10.8 02/12/2021   HGB 14.6 02/12/2021   HCT 42.9 02/12/2021   MCV 89 02/12/2021   PLT 311 02/12/2021    Attestation Statements:   Reviewed by clinician on day of visit: allergies, medications, problem list, medical history, surgical history, family history, social history, and previous encounter notes.  I, Kathlene November, BS, CMA, am acting as transcriptionist for [employer], DO.  I have reviewed the above documentation  for accuracy and completeness, and I agree with the above. Marjory Sneddon, D.O.  The [place] was signed into law in 2016 which includes the topic of electronic health records.  This provides immediate access to information in MyChart.  This includes consultation notes, operative notes, office notes, lab results and pathology reports.  If you have any questions about what you read please let us know at your next visit so we can discuss your concerns and take corrective action if need be.  We are right here with you.

## 2021-07-03 DIAGNOSIS — F4323 Adjustment disorder with mixed anxiety and depressed mood: Secondary | ICD-10-CM | POA: Diagnosis not present

## 2021-07-09 ENCOUNTER — Telehealth: Payer: 59 | Admitting: Physician Assistant

## 2021-07-09 ENCOUNTER — Other Ambulatory Visit (HOSPITAL_COMMUNITY): Payer: Self-pay

## 2021-07-09 DIAGNOSIS — J208 Acute bronchitis due to other specified organisms: Secondary | ICD-10-CM

## 2021-07-09 DIAGNOSIS — B9689 Other specified bacterial agents as the cause of diseases classified elsewhere: Secondary | ICD-10-CM

## 2021-07-09 MED ORDER — AZITHROMYCIN 250 MG PO TABS
ORAL_TABLET | ORAL | 0 refills | Status: AC
  Filled 2021-07-09: qty 6, 5d supply, fill #0

## 2021-07-09 MED ORDER — BENZONATATE 100 MG PO CAPS
100.0000 mg | ORAL_CAPSULE | Freq: Three times a day (TID) | ORAL | 0 refills | Status: DC | PRN
  Filled 2021-07-09: qty 30, 10d supply, fill #0

## 2021-07-09 NOTE — Progress Notes (Signed)
We are sorry that you are not feeling well.  Here is how we plan to help!  Based on your presentation I believe you most likely have A cough due to bacteria.  When patients have a fever and a productive cough with a change in color or increased sputum production, we are concerned about bacterial bronchitis.  If left untreated it can progress to pneumonia.  If your symptoms do not improve with your treatment plan it is important that you contact your provider.   I have prescribed Azithromyin 250 mg: two tablets now and then one tablet daily for 4 additonal days    In addition you may use A non-prescription cough medication called Mucinex DM: take 2 tablets every 12 hours. and A prescription cough medication called Tessalon Perles 100mg. You may take 1-2 capsules every 8 hours as needed for your cough.   From your responses in the eVisit questionnaire you describe inflammation in the upper respiratory tract which is causing a significant cough.  This is commonly called Bronchitis and has four common causes:   Allergies Viral Infections Acid Reflux Bacterial Infection Allergies, viruses and acid reflux are treated by controlling symptoms or eliminating the cause. An example might be a cough caused by taking certain blood pressure medications. You stop the cough by changing the medication. Another example might be a cough caused by acid reflux. Controlling the reflux helps control the cough.  USE OF BRONCHODILATOR ("RESCUE") INHALERS: There is a risk from using your bronchodilator too frequently.  The risk is that over-reliance on a medication which only relaxes the muscles surrounding the breathing tubes can reduce the effectiveness of medications prescribed to reduce swelling and congestion of the tubes themselves.  Although you feel brief relief from the bronchodilator inhaler, your asthma may actually be worsening with the tubes becoming more swollen and filled with mucus.  This can delay other  crucial treatments, such as oral steroid medications. If you need to use a bronchodilator inhaler daily, several times per day, you should discuss this with your provider.  There are probably better treatments that could be used to keep your asthma under control.     HOME CARE Only take medications as instructed by your medical team. Complete the entire course of an antibiotic. Drink plenty of fluids and get plenty of rest. Avoid close contacts especially the very young and the elderly Cover your mouth if you cough or cough into your sleeve. Always remember to wash your hands A steam or ultrasonic humidifier can help congestion.   GET HELP RIGHT AWAY IF: You develop worsening fever. You become short of breath You cough up blood. Your symptoms persist after you have completed your treatment plan MAKE SURE YOU  Understand these instructions. Will watch your condition. Will get help right away if you are not doing well or get worse.    Thank you for choosing an e-visit.  Your e-visit answers were reviewed by a board certified advanced clinical practitioner to complete your personal care plan. Depending upon the condition, your plan could have included both over the counter or prescription medications.  Please review your pharmacy choice. Make sure the pharmacy is open so you can pick up prescription now. If there is a problem, you may contact your provider through MyChart messaging and have the prescription routed to another pharmacy.  Your safety is important to us. If you have drug allergies check your prescription carefully.   For the next 24 hours you can use   MyChart to ask questions about today's visit, request a non-urgent call back, or ask for a work or school excuse. You will get an email in the next two days asking about your experience. I hope that your e-visit has been valuable and will speed your recovery.  I provided 5 minutes of non face-to-face time during this encounter  for chart review and documentation.   

## 2021-07-10 ENCOUNTER — Ambulatory Visit (INDEPENDENT_AMBULATORY_CARE_PROVIDER_SITE_OTHER): Payer: 59 | Admitting: Family Medicine

## 2021-07-11 ENCOUNTER — Other Ambulatory Visit (HOSPITAL_COMMUNITY): Payer: Self-pay

## 2021-07-14 ENCOUNTER — Other Ambulatory Visit (HOSPITAL_COMMUNITY): Payer: Self-pay

## 2021-07-14 DIAGNOSIS — F4323 Adjustment disorder with mixed anxiety and depressed mood: Secondary | ICD-10-CM | POA: Diagnosis not present

## 2021-07-23 ENCOUNTER — Other Ambulatory Visit (HOSPITAL_COMMUNITY): Payer: Self-pay

## 2021-07-24 DIAGNOSIS — F4323 Adjustment disorder with mixed anxiety and depressed mood: Secondary | ICD-10-CM | POA: Diagnosis not present

## 2021-08-04 DIAGNOSIS — F4323 Adjustment disorder with mixed anxiety and depressed mood: Secondary | ICD-10-CM | POA: Diagnosis not present

## 2021-08-07 ENCOUNTER — Telehealth: Payer: 59 | Admitting: Physician Assistant

## 2021-08-07 ENCOUNTER — Other Ambulatory Visit (HOSPITAL_COMMUNITY): Payer: Self-pay

## 2021-08-07 DIAGNOSIS — J069 Acute upper respiratory infection, unspecified: Secondary | ICD-10-CM

## 2021-08-07 DIAGNOSIS — B9689 Other specified bacterial agents as the cause of diseases classified elsewhere: Secondary | ICD-10-CM | POA: Diagnosis not present

## 2021-08-07 MED ORDER — AMOXICILLIN-POT CLAVULANATE 875-125 MG PO TABS
1.0000 | ORAL_TABLET | Freq: Two times a day (BID) | ORAL | 0 refills | Status: DC
  Filled 2021-08-07: qty 14, 7d supply, fill #0

## 2021-08-07 MED ORDER — FLUTICASONE PROPIONATE 50 MCG/ACT NA SUSP
2.0000 | Freq: Every day | NASAL | 0 refills | Status: DC
  Filled 2021-08-07: qty 16, 30d supply, fill #0

## 2021-08-07 MED ORDER — PSEUDOEPH-BROMPHEN-DM 30-2-10 MG/5ML PO SYRP
5.0000 mL | ORAL_SOLUTION | Freq: Four times a day (QID) | ORAL | 0 refills | Status: DC | PRN
  Filled 2021-08-07: qty 120, 6d supply, fill #0

## 2021-08-07 NOTE — Progress Notes (Signed)
E-Visit for Upper Respiratory Infection   We are sorry you are not feeling well.  Here is how we plan to help!  Based on what you have shared with me, it looks like you may have a bacterial upper respiratory infection.   I have prescribed Augmentin 875-'125mg'$  Take 1 tablet twice daily for 10 days.  Upper respiratory infections can be transmitted from person to person, with the most common method of transmission being a person's hands.  The virus is able to live on the skin and can infect other persons for up to 2 hours after direct contact.  Also, these can be transmitted when someone coughs or sneezes; thus, it is important to cover the mouth to reduce this risk.  To keep the spread of the illness at [place], good hand hygiene is very important.  This is an infection that is most likely caused by a virus. There are no specific treatments other than to help you with the symptoms until the infection runs its course.  We are sorry you are not feeling well.  Here is how we plan to help!   For nasal congestion, you may use an oral decongestants such as Mucinex D or if you have glaucoma or high blood pressure use plain Mucinex.  Saline nasal spray or nasal drops can help and can safely be used as often as needed for congestion.  For your congestion, I have prescribed Fluticasone nasal spray one spray in each nostril twice a day  If you do not have a history of heart disease, hypertension, diabetes or thyroid disease, prostate/bladder issues or glaucoma, you may also use Sudafed to treat nasal congestion.  It is highly recommended that you consult with a pharmacist or your primary care physician to ensure this medication is safe for you to take.     If you have a cough, you may use cough suppressants such as Delsym and Robitussin.  If you have glaucoma or high blood pressure, you can also use Coricidin HBP.   For cough I have prescribed for you a prescription cough syrup called Bromfed-DM Take 90m every 6  hours as needed for cough.   If you have a sore or scratchy throat, use a saltwater gargle-  to  teaspoon of salt dissolved in a 4-ounce to 8-ounce glass of warm water.  Gargle the solution for approximately 15-30 seconds and then spit.  It is important not to swallow the solution.  You can also use throat lozenges/cough drops and Chloraseptic spray to help with throat pain or discomfort.  Warm or cold liquids can also be helpful in relieving throat pain.  For headache, pain or general discomfort, you can use Ibuprofen or Tylenol as directed.   Some authorities believe that zinc sprays or the use of Echinacea may shorten the course of your symptoms.   HOME CARE Only take medications as instructed by your medical team. Be sure to drink plenty of fluids. Water is fine as well as fruit juices, sodas and electrolyte beverages. You may want to stay away from caffeine or alcohol. If you are nauseated, try taking small sips of liquids. How do you know if you are getting enough fluid? Your urine should be a pale yellow or almost colorless. Get rest. Taking a steamy shower or using a humidifier may help nasal congestion and ease sore throat pain. You can place a towel over your head and breathe in the steam from hot water coming from a faucet. Using a saline  nasal spray works much the same way. Cough drops, hard candies and sore throat lozenges may ease your cough. Avoid close contacts especially the very young and the elderly Cover your mouth if you cough or sneeze Always remember to wash your hands.   GET HELP RIGHT AWAY IF: You develop worsening fever. If your symptoms do not improve within 10 days You develop yellow or green discharge from your nose over 3 days. You have coughing fits You develop a severe head ache or visual changes. You develop shortness of breath, difficulty breathing or start having chest pain Your symptoms persist after you have completed your treatment plan  MAKE SURE  YOU  Understand these instructions. Will watch your condition. Will get help right away if you are not doing well or get worse.  Thank you for choosing an e-visit.  Your e-visit answers were reviewed by a board certified advanced clinical practitioner to complete your personal care plan. Depending upon the condition, your plan could have included both over the counter or prescription medications.  Please review your pharmacy choice. Make sure the pharmacy is open so you can pick up prescription now. If there is a problem, you may contact your provider through [employer] and have the prescription routed to another pharmacy.  Your safety is important to [place]. If you have drug allergies check your prescription carefully.   For the next 24 hours you can use MyChart to ask questions about today's visit, request a non-urgent call back, or ask for a work or school excuse. You will get an email in the next two days asking about your experience. I hope that your e-visit has been valuable and will speed your recovery.  I provided 5 minutes of non face-to-face time during this encounter for chart review and documentation.

## 2021-08-08 ENCOUNTER — Ambulatory Visit (HOSPITAL_COMMUNITY)
Admission: EM | Admit: 2021-08-08 | Discharge: 2021-08-08 | Disposition: A | Discharge status: Home / Self Care | Payer: 59 | Attending: Internal Medicine | Admitting: Internal Medicine

## 2021-08-08 ENCOUNTER — Encounter (HOSPITAL_COMMUNITY): Payer: Self-pay | Admitting: Emergency Medicine

## 2021-08-08 ENCOUNTER — Ambulatory Visit (INDEPENDENT_AMBULATORY_CARE_PROVIDER_SITE_OTHER): Payer: 59 | Admitting: Plastic Surgery

## 2021-08-08 ENCOUNTER — Encounter: Payer: Self-pay | Admitting: Plastic Surgery

## 2021-08-08 DIAGNOSIS — M545 Low back pain, unspecified: Secondary | ICD-10-CM

## 2021-08-08 DIAGNOSIS — J069 Acute upper respiratory infection, unspecified: Secondary | ICD-10-CM | POA: Diagnosis not present

## 2021-08-08 DIAGNOSIS — N62 Hypertrophy of breast: Secondary | ICD-10-CM | POA: Insufficient documentation

## 2021-08-08 DIAGNOSIS — M546 Pain in thoracic spine: Secondary | ICD-10-CM

## 2021-08-08 DIAGNOSIS — R051 Acute cough: Secondary | ICD-10-CM

## 2021-08-08 DIAGNOSIS — G8929 Other chronic pain: Secondary | ICD-10-CM

## 2021-08-08 DIAGNOSIS — Z6836 Body mass index (BMI) 36.0-36.9, adult: Secondary | ICD-10-CM | POA: Diagnosis not present

## 2021-08-08 DIAGNOSIS — M542 Cervicalgia: Secondary | ICD-10-CM | POA: Insufficient documentation

## 2021-08-08 DIAGNOSIS — M549 Dorsalgia, unspecified: Secondary | ICD-10-CM | POA: Insufficient documentation

## 2021-08-08 DIAGNOSIS — R062 Wheezing: Secondary | ICD-10-CM

## 2021-08-08 MED ORDER — METHYLPREDNISOLONE SODIUM SUCC 125 MG IJ SOLR
INTRAMUSCULAR | Status: AC
  Filled 2021-08-08: qty 2

## 2021-08-08 MED ORDER — METHYLPREDNISOLONE SODIUM SUCC 125 MG IJ SOLR
80.0000 mg | Freq: Once | INTRAMUSCULAR | Status: AC
  Administered 2021-08-08: 80 mg via INTRAMUSCULAR

## 2021-08-08 NOTE — ED Triage Notes (Signed)
Pt reports cough and wheezing for about a week. Did tele visit yesterday. Was prescribed antibiotic, cough medications and flonase. Reports cough making her wheeze. Reports tried neb treatments which makes cough worse which makes breathing worse.

## 2021-08-08 NOTE — ED Provider Notes (Signed)
[place]    CSN: [id number] Arrival date & time: 08/08/21  1752      History   Chief Complaint Chief Complaint  Patient presents with   Cough    Upper respiratory/wheezing - Entered by patient   Wheezing    HPI Monica Benjamin is a 41 y.o. female.   Patient presents with nasal congestion, cough, wheezing that has been present for about a week.  Patient had televisit yesterday and was prescribed Augmentin, Flonase, cough medication.  She states that she has taken 2 doses of Augmentin with minimal improvement.  She is concerned due to persistent wheezing.  She has history of asthma and has used albuterol inhaler and Symbicort inhaler as well as a nebulizer treatment with minimal improvement.  Denies any associated shortness of breath but does endorse chest tightness.  Denies any associated chest pain, fever, nausea, vomiting, diarrhea, abdominal pain.  Denies any known sick contacts.   Cough Wheezing   Past Medical History:  Diagnosis Date   Allergy    Anxiety    Asthma    Back pain    Depression    GERD (gastroesophageal reflux disease)    Hyperlipidemia    Hypertension    IBS (irritable bowel syndrome)    Infertility, female 02/15/2017   IVF treatment   Lumbar herniated disc    L4-L5   Migraines    Other fatigue    Shortness of breath on exertion    Sleep apnea    Trigeminal neuralgia    Vitamin D deficiency 01/26/2021    Patient Active Problem List   Diagnosis Date Noted   Back pain 08/08/2021   Neck pain 08/08/2021   Symptomatic mammary hypertrophy 08/08/2021   Other hyperlipidemia 02/26/2021   Gastroesophageal reflux disease 02/26/2021   Insulin resistance 02/26/2021   Vitamin D deficiency 02/26/2021   OSA (obstructive sleep apnea) 02/26/2021   NAFLD (nonalcoholic fatty liver disease) 02/26/2021   At risk for diabetes mellitus 02/26/2021   Mild episode of recurrent major depressive disorder ([place]) 05/10/2018   Hypertriglyceridemia  05/10/2018   Essential hypertension 03/16/2018   Infertility, female 02/15/2017   OSA on CPAP 05/25/2016   Chronic seasonal allergic rhinitis due to pollen 11/25/2015   GAD (generalized anxiety disorder) 11/25/2015   Gastroesophageal reflux disease without esophagitis 11/25/2015   Irritable bowel syndrome with both constipation and diarrhea 11/25/2015   Moderate persistent asthma without complication 63/14/9702    Past Surgical History:  Procedure Laterality Date   WISDOM TOOTH EXTRACTION      OB History     Gravida  0   Para  0   Term  0   Preterm  0   AB  0   Living  0      SAB  0   IAB  0   Ectopic  0   Multiple  0   Live Births  0            Home Medications    Prior to Admission medications   Medication Sig Start Date End Date Taking? Authorizing Provider  albuterol (VENTOLIN HFA) 108 (90 Base) MCG/ACT inhaler Inhale 2 puffs into the lungs every 4 (four) hours as needed (cough, shortness of breath or wheezing.). 05/29/20   Chevis Pretty, FNP  amoxicillin-clavulanate (AUGMENTIN) 875-125 MG tablet Take 1 tablet by mouth 2 (two) times daily. 08/07/21   Mar Daring, PA-C  brompheniramine-pseudoephedrine-DM 30-2-10 MG/5ML syrup Take 5 mLs by mouth 4 (four) times daily  as needed. 08/07/21   Mar Daring, PA-C  budesonide-formoterol (SYMBICORT) 80-4.5 MCG/ACT inhaler Inhale 2 puffs into the lungs 2 (two) times daily. 10/01/20   Allwardt, Randa Evens, PA-C  cetirizine (ZYRTEC) 10 MG tablet Take 10 mg by mouth daily.    [provider]  clonazePAM (KLONOPIN) 0.5 MG tablet Take 0.5-1 tablets (0.25-0.5 mg total) by mouth daily as needed for anxiety 04/25/21   Leamon Arnt, MD  fluticasone [facility]) 50 MCG/ACT nasal spray Place 2 sprays into both nostrils daily. 08/07/21   Mar Daring, PA-C  hydrochlorothiazide (HYDRODIURIL) 25 MG tablet Take 1 tablet (25 mg total) by mouth daily. 10/01/20   Allwardt, Randa Evens, PA-C  Omega-3 Fatty  Acids (FISH OIL) 1000 MG CAPS Take 1 capsule by mouth daily.    [provider]  pantoprazole (PROTONIX) 20 MG tablet Take 1 tablet (20 mg total) by mouth daily. 01/28/21   Leamon Arnt, MD  Vitamin D, Ergocalciferol, (DRISDOL) 1.25 MG (50000 UNIT) CAPS capsule Take 1 capsule (50,000 Units total) by mouth every 7 (seven) days. 06/19/21   Opalski, Neoma Laming, DO  vortioxetine HBr (TRINTELLIX) 10 MG TABS tablet Take 1 tablet (10 mg total) by mouth daily. 01/28/21   Leamon Arnt, MD    Family History Family History  Problem Relation Age of Onset   Anxiety disorder Mother    Osteoporosis Mother    Migraines Mother    Alcoholism Father    Hyperlipidemia Father    Hypertension Father    Migraines Sister    Dementia Maternal Grandmother    Diabetes Maternal Grandfather    COPD Paternal Grandmother    Anxiety disorder Other    Alcoholism Other    Cancer Neg Hx    Heart disease Neg Hx     Social History Social History   Tobacco Use   Smoking status: Never   Smokeless tobacco: Never  Vaping Use   Vaping Use: Never used  Substance Use Topics   Alcohol use: Not Currently    Comment: 2 drinks/month   Drug use: No     Allergies   Ace inhibitors, Nsaids, and Prednisone   Review of Systems Review of Systems Per HPI  Physical Exam Triage Vital Signs ED Triage Vitals  Enc Vitals Group     BP 08/08/21 1841 128/89     Pulse Rate 08/08/21 1841 91     Resp 08/08/21 1841 18     Temp 08/08/21 1841 98.2 F (36.8 C)     Temp Source 08/08/21 1841 Oral     SpO2 08/08/21 1841 98 %     Weight --      Height --      Head Circumference --      Peak Flow --      Pain Score 08/08/21 1839 5     Pain Loc --      Pain Edu? --      Excl. in [place]? --    No data found.  Updated Vital Signs BP 128/89 (BP Location: Left Arm)   Pulse 91   Temp 98.2 F (36.8 C) (Oral)   Resp 18   LMP 08/04/2021   SpO2 98%   Visual Acuity Right Eye Distance:   Left Eye Distance:   Bilateral  Distance:    Right Eye Near:   Left Eye Near:    Bilateral Near:     Physical Exam Constitutional:      General: She is not in  acute distress.    Appearance: Normal appearance. She is not toxic-appearing or diaphoretic.  HENT:     Head: Normocephalic and atraumatic.     Right Ear: Tympanic membrane and ear canal normal.     Left Ear: Tympanic membrane and ear canal normal.     Nose: Congestion present.     Mouth/Throat:     Mouth: Mucous membranes are moist.     Pharynx: No posterior oropharyngeal erythema.  Eyes:     Extraocular Movements: Extraocular movements intact.     Conjunctiva/sclera: Conjunctivae normal.     Pupils: Pupils are equal, round, and reactive to light.  Cardiovascular:     Rate and Rhythm: Normal rate and regular rhythm.     Pulses: Normal pulses.     Heart sounds: Normal heart sounds.  Pulmonary:     Effort: Pulmonary effort is normal. No respiratory distress.     Breath sounds: No stridor. Wheezing present. No rhonchi or rales.     Comments: Mild wheezing noted on exam.  Abdominal:     General: Abdomen is flat. Bowel sounds are normal.     Palpations: Abdomen is soft.  Musculoskeletal:        General: Normal range of motion.     Cervical back: Normal range of motion.  Skin:    General: Skin is warm and dry.  Neurological:     General: No focal deficit present.     Mental Status: She is alert and oriented to person, place, and time. Mental status is at baseline.  Psychiatric:        Mood and Affect: Mood normal.        Behavior: Behavior normal.      UC Treatments / Results  Labs (all labs ordered are listed, but only abnormal results are displayed) Labs Reviewed - No data to display  EKG   Radiology No results found.  Procedures Procedures (including critical care time)  Medications Ordered in UC Medications  methylPREDNISolone sodium succinate (SOLU-MEDROL) 125 mg/2 mL injection 80 mg (has no administration in time range)     Initial Impression / Assessment and Plan / UC Course  I have reviewed the triage vital signs and the nursing notes.  Pertinent labs & imaging results that were available during my care of the patient were reviewed by me and considered in my medical decision making (see chart for details).     Patient has acute upper respiratory infection that could be viral in nature.  I do think patient would benefit from steroids given history of asthma with associated wheezing in the setting of acute illness.  Patient states that she typically takes steroids that helps alleviate symptoms and tolerates well.  She is requesting IM steroids given that oral steroids typically cause leg cramping and she does not tolerate them well.  IM Solu-Medrol administered in urgent care today.  Advised patient to continue antibiotic and cough medication prescribed by televisit.  Do not think that chest imaging is necessary at this time.  Discussed return precautions.  Patient verbalized understanding and was agreeable with plan. Final Clinical Impressions(s) / UC Diagnoses   Final diagnoses:  Acute upper respiratory infection  Acute cough  Wheezing     Discharge Instructions      You were given a steroid injection in urgent care today.  Please continue taking antibiotic that was prescribed by televisit healthcare provider.  Please follow-up if symptoms persist or worsen.    ED Prescriptions   None  PDMP not reviewed this encounter.   Teodora Medici, [place] 08/08/21 1857

## 2021-08-08 NOTE — Discharge Instructions (Signed)
You were given a steroid injection in urgent care today.  Please continue taking antibiotic that was prescribed by televisit healthcare provider.  Please follow-up if symptoms persist or worsen.

## 2021-08-08 NOTE — Progress Notes (Signed)
Patient ID: Monica Benjamin, female    DOB: 1980/09/20, 41 y.o.   MRN: [id number]   Chief Complaint  Patient presents with   Breast Problem    Mammary Hyperplasia: The patient is a 41 y.o. female with a history of mammary hyperplasia for several years.  She has extremely large breasts causing symptoms that include the following: Back pain in the upper and lower back, including neck pain. She pulls or pins her bra straps to provide better lift and relief of the pressure and pain. She notices relief by holding her breast up manually.  Her shoulder straps cause grooves and pain and pressure that requires padding for relief. Pain medication is sometimes required with motrin and tylenol.  Activities that are hindered by enlarged breasts include: exercise and running.  She has tried supportive clothing as well as fitted bras without improvement.  Her breasts are extremely large and fairly symmetric.  She has hyperpigmentation of the inframammary area on both sides.  The sternal to nipple distance on the right is 33 cm and the left is 33 cm.  The IMF distance is 18 cm.  She is 5 feet 4 inches tall and weighs 213 pounds.  The BMI = 36.6 kg/m.  Preoperative bra size = DD/DDD cup. She would like to be a C cup.  The estimated excess breast tissue to be removed at the time of surgery = 600-680 grams on the left and 600-680 grams on the right.  Mammogram history: 1/23 negative.  Family history of breast cancer:  no.  Tobacco use:  no.   The patient expresses the desire to pursue surgical intervention. Had PT for back pain through [facility].    Review of Systems  Constitutional: Negative.   Eyes: Negative.   Respiratory: Negative.    Cardiovascular: Negative.   Gastrointestinal: Negative.   Endocrine: Negative.   Genitourinary: Negative.   Musculoskeletal:  Positive for back pain and neck pain.  Allergic/Immunologic: Negative.   Neurological: Negative.   Hematological: Negative.    Psychiatric/Behavioral: Negative.      Past Medical History:  Diagnosis Date   Allergy    Anxiety    Asthma    Back pain    Depression    GERD (gastroesophageal reflux disease)    Hyperlipidemia    Hypertension    IBS (irritable bowel syndrome)    Infertility, female 02/15/2017   IVF treatment   Lumbar herniated disc    L4-L5   Migraines    Other fatigue    Shortness of breath on exertion    Sleep apnea    Trigeminal neuralgia    Vitamin D deficiency 01/26/2021    Past Surgical History:  Procedure Laterality Date   WISDOM TOOTH EXTRACTION        Current Outpatient Medications:    albuterol (VENTOLIN HFA) 108 (90 Base) MCG/ACT inhaler, Inhale 2 puffs into the lungs every 4 (four) hours as needed (cough, shortness of breath or wheezing.)., Disp: 18 g, Rfl: 1   amoxicillin-clavulanate (AUGMENTIN) 875-125 MG tablet, Take 1 tablet by mouth 2 (two) times daily., Disp: 14 tablet, Rfl: 0   brompheniramine-pseudoephedrine-DM 30-2-10 MG/5ML syrup, Take 5 mLs by mouth 4 (four) times daily as needed., Disp: 120 mL, Rfl: 0   budesonide-formoterol (SYMBICORT) 80-4.5 MCG/ACT inhaler, Inhale 2 puffs into the lungs 2 (two) times daily., Disp: 10.2 g, Rfl: 3   cetirizine (ZYRTEC) 10 MG tablet, Take 10 mg by mouth daily., Disp: , Rfl:  clonazePAM (KLONOPIN) 0.5 MG tablet, Take 0.5-1 tablets (0.25-0.5 mg total) by mouth daily as needed for anxiety, Disp: 20 tablet, Rfl: 1   fluticasone (FLONASE) 50 MCG/ACT nasal spray, Place 2 sprays into both nostrils daily., Disp: 16 g, Rfl: 0   hydrochlorothiazide (HYDRODIURIL) 25 MG tablet, Take 1 tablet (25 mg total) by mouth daily., Disp: 90 tablet, Rfl: 3   Omega-3 Fatty Acids (FISH OIL) 1000 MG CAPS, Take 1 capsule by mouth daily., Disp: , Rfl:    pantoprazole (PROTONIX) 20 MG tablet, Take 1 tablet (20 mg total) by mouth daily., Disp: 90 tablet, Rfl: 3   Vitamin D, Ergocalciferol, (DRISDOL) 1.25 MG (50000 UNIT) CAPS capsule, Take 1 capsule (50,000  Units total) by mouth every 7 (seven) days., Disp: 4 capsule, Rfl: 0   vortioxetine HBr (TRINTELLIX) 10 MG TABS tablet, Take 1 tablet (10 mg total) by mouth daily., Disp: 90 tablet, Rfl: 3   Objective:   There were no vitals filed for this visit.  Physical Exam Vitals and nursing note reviewed.  Constitutional:      Appearance: Normal appearance.  HENT:     Head: Normocephalic and atraumatic.  Cardiovascular:     Rate and Rhythm: Normal rate.     Pulses: Normal pulses.  Pulmonary:     Effort: Pulmonary effort is normal.  Abdominal:     General: There is no distension.     Palpations: Abdomen is soft.     Tenderness: There is no abdominal tenderness.     Hernia: No hernia is present.  Musculoskeletal:        General: No swelling or deformity.  Skin:    General: Skin is warm.     Capillary Refill: Capillary refill takes less than 2 seconds.  Neurological:     Mental Status: She is alert and oriented to person, place, and time.  Psychiatric:        Mood and Affect: Mood normal.        Behavior: Behavior normal.        Thought Content: Thought content normal.     Assessment & Plan:  Chronic bilateral thoracic back pain  Neck pain  Symptomatic mammary hypertrophy  The procedure the patient selected and that was best for the patient was discussed. The risk were discussed and include but not limited to the following:  Breast asymmetry, fluid accumulation, firmness of the breast, inability to breast feed, loss of nipple or areola, skin loss, change in skin and nipple sensation, fat necrosis of the breast tissue, bleeding, infection and healing delay.  There are risks of anesthesia and injury to nerves or blood vessels.  Allergic reaction to tape, suture and skin glue are possible.  There will be swelling.  Any of these can lead to the need for revisional surgery.  A breast reduction has potential to interfere with diagnostic procedures in the future.  This procedure is best done  when the breast is fully developed.  Changes in the breast will continue to occur over time: pregnancy, weight gain or weigh loss.    Total time: 40 minutes. This includes time spent with the patient during the visit as well as time spent before and after the visit reviewing the chart, documenting the encounter, ordering pertinent studies and literature for the patient.   Physical therapy:  done Mammogram:  done [facility]:  currently seeing them and would like to be 180 s  The patient is a very good candidate for bilateral  breast reduction with possible liposuction.  She will do a virtual visit in 3 months and let us know how she is doing with her weight loss.    Pictures were obtained of the patient and placed in the chart with the patient's or guardian's permission.   [place], DO

## 2021-08-11 DIAGNOSIS — F4323 Adjustment disorder with mixed anxiety and depressed mood: Secondary | ICD-10-CM | POA: Diagnosis not present

## 2021-09-03 ENCOUNTER — Encounter (INDEPENDENT_AMBULATORY_CARE_PROVIDER_SITE_OTHER): Payer: Self-pay

## 2021-09-28 ENCOUNTER — Other Ambulatory Visit: Payer: Self-pay | Admitting: Family Medicine

## 2021-09-30 ENCOUNTER — Other Ambulatory Visit (HOSPITAL_COMMUNITY): Payer: Self-pay

## 2021-09-30 MED ORDER — CLONAZEPAM 0.5 MG PO TABS
0.2500 mg | ORAL_TABLET | Freq: Every day | ORAL | 1 refills | Status: DC
  Filled 2021-09-30: qty 20, 20d supply, fill #0
  Filled 2021-11-07: qty 20, 20d supply, fill #1

## 2021-10-01 ENCOUNTER — Other Ambulatory Visit (HOSPITAL_COMMUNITY): Payer: Self-pay

## 2021-10-03 ENCOUNTER — Other Ambulatory Visit (HOSPITAL_COMMUNITY): Payer: Self-pay

## 2021-10-03 ENCOUNTER — Ambulatory Visit (INDEPENDENT_AMBULATORY_CARE_PROVIDER_SITE_OTHER): Payer: 59 | Admitting: Plastic Surgery

## 2021-10-03 ENCOUNTER — Encounter: Payer: Self-pay | Admitting: Plastic Surgery

## 2021-10-03 DIAGNOSIS — N62 Hypertrophy of breast: Secondary | ICD-10-CM | POA: Diagnosis not present

## 2021-10-03 DIAGNOSIS — M542 Cervicalgia: Secondary | ICD-10-CM | POA: Diagnosis not present

## 2021-10-03 DIAGNOSIS — M546 Pain in thoracic spine: Secondary | ICD-10-CM | POA: Diagnosis not present

## 2021-10-03 DIAGNOSIS — Z9189 Other specified personal risk factors, not elsewhere classified: Secondary | ICD-10-CM

## 2021-10-03 DIAGNOSIS — G8929 Other chronic pain: Secondary | ICD-10-CM

## 2021-10-03 NOTE — Progress Notes (Signed)
   Subjective:    Patient ID: Monica Benjamin, female    DOB: [birth year], 41 y.o.   MRN: [id number]  The patient is a 41 year old female joining me by phone for further discussion about her breast reduction.  She has complaints of extremely large breasts that contribute to neck and back pain.  She has done physical therapy without improvement.  She has to pin or clip her bra straps in order to lift up her breast into proper position.  She feels that she is not able to be as active as she wants because of the pain and excess weight.  The breasts are fairly symmetric and have a sternal to nipple distance of 33 cm on the right and 33 cm on the left.  She is 5 feet 4 inches tall and weighs 205 pounds.  Her body mass index is 35.2 kg/m.  Her preop bra size = DDD/DDD cup.  She would like to be a C cup.  The amount of tissue to be removed at this time is calculated at 600-625 g on each breast.     Review of Systems  Constitutional: Negative.   Eyes: Negative.   Respiratory: Negative.  Negative for chest tightness.   Cardiovascular: Negative.  Negative for leg swelling.  Gastrointestinal: Negative.   Endocrine: Negative.   Genitourinary: Negative.        Objective:   Physical Exam     Assessment & Plan:     ICD-10-CM   1. Symptomatic mammary hypertrophy  N62     2. Neck pain  M54.2     3. Chronic bilateral thoracic back pain  M54.6    G89.29     4. At risk for diabetes mellitus  Z91.89        I connected with  [place] on 10/03/21 by phone and verified that I am speaking with the correct person using two identifiers.  The patient was at home and I was at the office.  We spent approximately 10 minutes in discussion.  I would like to move ahead with submission for bilateral breast reduction with possible liposuction.  Patient will continue to work on healthy eating and weight loss between now and surgery.  I discussed the limitations of evaluation and management by  telemedicine. The patient expressed understanding and agreed to proceed.

## 2021-10-20 ENCOUNTER — Encounter: Payer: Self-pay | Admitting: *Deleted

## 2021-10-21 ENCOUNTER — Other Ambulatory Visit (HOSPITAL_COMMUNITY): Payer: Self-pay

## 2021-11-07 ENCOUNTER — Other Ambulatory Visit (HOSPITAL_COMMUNITY): Payer: Self-pay

## 2021-11-07 ENCOUNTER — Encounter: Payer: Self-pay | Admitting: Plastic Surgery

## 2021-11-11 ENCOUNTER — Telehealth: Payer: Self-pay | Admitting: *Deleted

## 2021-11-11 NOTE — Telephone Encounter (Signed)
Auth submitted for CPT [id number] via portal

## 2021-11-14 ENCOUNTER — Other Ambulatory Visit (HOSPITAL_COMMUNITY): Payer: Self-pay

## 2021-11-14 ENCOUNTER — Telehealth: Payer: 59 | Admitting: Emergency Medicine

## 2021-11-14 DIAGNOSIS — B3731 Acute candidiasis of vulva and vagina: Secondary | ICD-10-CM | POA: Diagnosis not present

## 2021-11-14 MED ORDER — FLUCONAZOLE 150 MG PO TABS
150.0000 mg | ORAL_TABLET | Freq: Every day | ORAL | 0 refills | Status: DC
  Filled 2021-11-14: qty 1, 1d supply, fill #0

## 2021-11-14 NOTE — Progress Notes (Signed)

## 2021-11-20 ENCOUNTER — Telehealth: Payer: Self-pay | Admitting: *Deleted

## 2021-11-20 NOTE — Telephone Encounter (Signed)
MyChart Message sent offering the following surgery date options:   12/17/2021 @ 10:30am 2.   12/31/2021 @ 8:30am 3.   01/15/2022 @ 10:45am  [facility] Jacqlyn Krauss Josem Kaufmann: [id number] valid 11/11/21 - 11/10/22

## 2021-12-01 ENCOUNTER — Telehealth: Payer: Self-pay | Admitting: *Deleted

## 2021-12-01 NOTE — Telephone Encounter (Signed)
LVM for patient offering to move surgery up to 12/10/21 @ 730a

## 2021-12-03 ENCOUNTER — Other Ambulatory Visit: Payer: Self-pay | Admitting: Family Medicine

## 2021-12-04 ENCOUNTER — Other Ambulatory Visit (HOSPITAL_COMMUNITY): Payer: Self-pay

## 2021-12-15 ENCOUNTER — Other Ambulatory Visit (HOSPITAL_COMMUNITY): Payer: Self-pay

## 2021-12-15 ENCOUNTER — Ambulatory Visit (INDEPENDENT_AMBULATORY_CARE_PROVIDER_SITE_OTHER): Payer: 59 | Admitting: Student

## 2021-12-15 ENCOUNTER — Encounter: Payer: 59 | Admitting: Student

## 2021-12-15 VITALS — BP 135/85 | HR 69 | Ht 64.0 in | Wt 219.8 lb

## 2021-12-15 DIAGNOSIS — N62 Hypertrophy of breast: Secondary | ICD-10-CM

## 2021-12-15 MED ORDER — ONDANSETRON HCL 4 MG PO TABS
4.0000 mg | ORAL_TABLET | Freq: Three times a day (TID) | ORAL | 0 refills | Status: DC | PRN
  Filled 2021-12-15: qty 20, 7d supply, fill #0

## 2021-12-15 MED ORDER — OXYCODONE HCL 5 MG PO TABS
5.0000 mg | ORAL_TABLET | Freq: Three times a day (TID) | ORAL | 0 refills | Status: DC | PRN
  Filled 2021-12-15: qty 20, 7d supply, fill #0

## 2021-12-15 MED ORDER — CEPHALEXIN 500 MG PO CAPS
500.0000 mg | ORAL_CAPSULE | Freq: Four times a day (QID) | ORAL | 0 refills | Status: DC
  Filled 2021-12-15: qty 12, 3d supply, fill #0

## 2021-12-15 NOTE — Progress Notes (Signed)
Patient ID: Monica Benjamin, female    DOB: 09-15-1980, 41 y.o.   MRN: [id number]  Chief Complaint  Patient presents with   Pre-op Exam      ICD-10-CM   1. Symptomatic mammary hypertrophy  N62        History of Present Illness: Monica Benjamin is a 41 y.o.  female  with a history of macromastia.  She presents for preoperative evaluation for upcoming procedure, Bilateral Breast Reduction with liposuction, scheduled for 12/31/21 with Dr.  Marla Roe  Patient reports that she has had some level of sedation in the past, but not general anesthesia.  She reports that she did okay with what she had in the past.  Patient reports that when she got her first mammogram at 41 years old, there is a lesion.  She states that this was found to be benign.  She states that she was getting mammograms every 6 months to monitor it.  She states that nothing ever came of it, and now she has been cleared to just get yearly mammograms  She denies any personal history of breast cancer.  She reports that her grandmother had breast cancer.  Patient denies any cardiac disease.  She denies taking any blood thinners.  Patient reports she is not a smoker.  Patient denies being on any birth control or hormone replacement.  She denies any history of miscarriages.  She denies any personal or family history of blood clots or clotting diseases.  She denies any recent traumas, surgeries, infections or hospitalizations.  She denies any history of stroke or heart attack.  Patient denies any history of Crohn's disease or ulcerative colitis.  She does report she has asthma.  She denies any personal history of cancer.  She denies any recent fevers, chills or shortness of breath.  She denies any recent changes in her health.  Patient reports that she is currently a DDD cup and would like to be a C cup.  I discussed the limitation of hemorrhage tissue can be removed during surgery.  Patient expressed understanding.  Summary  of Previous Visit: Patient was seen by Dr. Marla Roe on 08/08/2021.  At this visit, patient reported back and neck pain related to enlarged breast.  She was found to have an STN of 33 on the left and right.  Her BMI was 36.6 kg/m.  Her bra size was a DD/DDD cup.  Patient at this visit reported she would like to be a C cup.  Patient was found to be a good candidate for breast reduction.  Plan was for patient to follow-up in 3 months to follow-up on her weight loss.  Patient later had a telephone encounter on 10/03/2021.  Plan was for patient to continue to work on healthy eating and weight loss between the visit and surgery, but plan overall is to move ahead with surgery.  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.  She states that she is planning to take 1 week off of work, and then work from home for 1 week.  PMH Significant for: Hypertension, GERD, OSA -wears CPAP   Past Medical History: Allergies: Allergies  Allergen Reactions   Ace Inhibitors Cough   Nsaids Swelling    Lips and face Other reaction(s): Unknown   Prednisone     Leg cramping    Current Medications:  Current Outpatient Medications:  albuterol (VENTOLIN HFA) 108 (90 Base) MCG/ACT inhaler, Inhale 2 puffs into the lungs every 4 (four) hours as needed (cough, shortness of breath or wheezing.)., Disp: 18 g, Rfl: 1   brompheniramine-pseudoephedrine-DM 30-2-10 MG/5ML syrup, Take 5 mLs by mouth 4 (four) times daily as needed., Disp: 120 mL, Rfl: 0   budesonide-formoterol (SYMBICORT) 80-4.5 MCG/ACT inhaler, Inhale 2 puffs into the lungs 2 (two) times daily., Disp: 10.2 g, Rfl: 3   cephALEXin (KEFLEX) 500 MG capsule, Take 1 capsule (500 mg total) by mouth 4 (four) times daily for 3 days., Disp: 12 capsule, Rfl: 0   cetirizine (ZYRTEC) 10 MG tablet, Take 10 mg by mouth daily., Disp: , Rfl:    clonazePAM (KLONOPIN) 0.5 MG  tablet, Take 0.5-1 tablets (0.25-0.5 mg total) by mouth daily as needed for anxiety, Disp: 20 tablet, Rfl: 1   fluconazole (DIFLUCAN) 150 MG tablet, Take 1 tablet (150 mg total) by mouth daily., Disp: 1 tablet, Rfl: 0   fluticasone (FLONASE) 50 MCG/ACT nasal spray, Place 2 sprays into both nostrils daily., Disp: 16 g, Rfl: 0   hydrochlorothiazide (HYDRODIURIL) 25 MG tablet, Take 1 tablet (25 mg total) by mouth daily., Disp: 90 tablet, Rfl: 3   Omega-3 Fatty Acids (FISH OIL) 1000 MG CAPS, Take 1 capsule by mouth daily., Disp: , Rfl:    ondansetron (ZOFRAN) 4 MG tablet, Take 1 tablet (4 mg total) by mouth every 8 (eight) hours as needed for up to 20 doses for nausea or vomiting., Disp: 20 tablet, Rfl: 0   oxyCODONE (ROXICODONE) 5 MG immediate release tablet, Take 1 tablet (5 mg total) by mouth every 8 (eight) hours as needed for up to 20 doses for severe pain., Disp: 20 tablet, Rfl: 0   pantoprazole (PROTONIX) 20 MG tablet, Take 1 tablet (20 mg total) by mouth daily., Disp: 90 tablet, Rfl: 3   Vitamin D, Ergocalciferol, (DRISDOL) 1.25 MG (50000 UNIT) CAPS capsule, Take 1 capsule (50,000 Units total) by mouth every 7 (seven) days., Disp: 4 capsule, Rfl: 0   vortioxetine HBr (TRINTELLIX) 10 MG TABS tablet, Take 1 tablet (10 mg total) by mouth daily., Disp: 90 tablet, Rfl: 3  Past Medical Problems: Past Medical History:  Diagnosis Date   Allergy    Anxiety    Asthma    Back pain    Depression    GERD (gastroesophageal reflux disease)    Hyperlipidemia    Hypertension    IBS (irritable bowel syndrome)    Infertility, female 02/15/2017   IVF treatment   Lumbar herniated disc    L4-L5   Migraines    Other fatigue    Shortness of breath on exertion    Sleep apnea    Trigeminal neuralgia    Vitamin D deficiency 01/26/2021    Past Surgical History: Past Surgical History:  Procedure Laterality Date   WISDOM TOOTH EXTRACTION      Social History: Social History   Socioeconomic History    Marital status: Married    Spouse name: Museum/gallery conservator   Number of children: 2   Years of education: Not on file   Highest education level: Not on file  Occupational History   Occupation: Energy manager: [employer]  Tobacco Use   Smoking status: Never   Smokeless tobacco: Never  Vaping Use   Vaping Use: Never used  Substance and Sexual Activity   Alcohol use: Not Currently    Comment: 2 drinks/month   Drug use: No  Sexual activity: Yes    Partners: Female    Birth control/protection: None    Comment: female  Other Topics Concern   Not on file  Social History Narrative   Married to [employer]   Social Determinants of Health   Financial Resource Strain: Not on file  Food Insecurity: Not on file  Transportation Needs: Not on file  Physical Activity: Not on file  Stress: Not on file  Social Connections: Not on file  Intimate Partner Violence: Not on file    Family History: Family History  Problem Relation Age of Onset   Anxiety disorder Mother    Osteoporosis Mother    Migraines Mother    Alcoholism Father    Hyperlipidemia Father    Hypertension Father    Migraines Sister    Dementia Maternal Grandmother    Diabetes Maternal Grandfather    COPD Paternal Grandmother    Anxiety disorder Other    Alcoholism Other    Cancer Neg Hx    Heart disease Neg Hx     Review of Systems: Denies any recent fevers, chills or shortness of breath.  She denies any recent changes in her health. Physical Exam: Vital Signs BP 135/85 (BP Location: Left Arm, Patient Position: Sitting, Cuff Size: Large)   Pulse 69   Ht '5\' 4"'$  (1.626 m)   Wt 219 lb 12.8 oz (99.7 kg)   SpO2 98%   BMI 37.73 kg/m   Physical Exam  Constitutional:      General: Not in acute distress.    Appearance: Normal appearance. Not ill-appearing.  HENT:     Head: Normocephalic and atraumatic.  Neck:     Musculoskeletal: Normal range of motion.  Cardiovascular:     Rate and Rhythm: Normal rate   Pulmonary:     Effort: Pulmonary effort is normal. No respiratory distress.  Musculoskeletal: Normal range of motion.  Lower Extremities: Nonswollen, no varicose veins noted Skin:    General: Skin is warm and dry.     Findings: No erythema or rash.  Neurological:     Mental Status: Alert and oriented to person, place, and time. Mental status is at baseline.  Psychiatric:        Mood and Affect: Mood normal.        Behavior: Behavior normal.    Assessment/Plan: The patient is scheduled for bilateral breast reduction with Dr. Marla Roe.  Risks, benefits, and alternatives of procedure discussed, questions answered and consent obtained.    Smoking Status: Non-smoker; Counseling Given?  N/A Last Mammogram: 02/21/2021; Results: BI-RADS Category 2: Benign  I discussed with the patient that she can get a mammogram 6 months after the surgery.  I discussed with the patient that she should check with her primary care provider who orders her mammograms to make sure that they are okay waiting another 6 months for the patient to get a mammogram.  Patient expressed understanding.  Caprini Score: 4; Risk Factors include: Age, BMI greater than 25, and length of planned surgery. Recommendation for mechanical prophylaxis. Encourage early ambulation.   Pictures obtained: '@consult'$   Post-op Rx sent to pharmacy: Oxycodone, Zofran, Keflex  I discussed with the patient to hold her Klonopin and hydrochlorothiazide the morning of surgery.  I discussed with the patient to hold vitamins and supplements 1 week prior to surgery.  Patient was provided with the breast reduction and General Surgical Risk consent document and Pain Medication Agreement prior to their appointment.  They had adequate time to read through  the risk consent documents and Pain Medication Agreement. We also discussed them in person together during this preop appointment. All of their questions were answered to their satisfaction.  Recommended  calling if they have any further questions.  Risk consent form and Pain Medication Agreement to be scanned into patient's chart.  The risk that can be encountered with breast reduction were discussed and include the following but not limited to these:  Breast asymmetry, fluid accumulation, firmness of the breast, inability to breast feed, loss of nipple or areola, skin loss, decrease or no nipple sensation, fat necrosis of the breast tissue, bleeding, infection, healing delay.  There are risks of anesthesia, changes to skin sensation and injury to nerves or blood vessels.  The muscle can be temporarily or permanently injured.  You may have an allergic reaction to tape, suture, glue, blood products which can result in skin discoloration, swelling, pain, skin lesions, poor healing.  Any of these can lead to the need for revisonal surgery or stage procedures.  A reduction has potential to interfere with diagnostic procedures.  Nipple or breast piercing can increase risks of infection.  This procedure is best done when the breast is fully developed.  Changes in the breast will continue to occur over time.  Pregnancy can alter the outcomes of previous breast reduction surgery, weight gain and weigh loss can also effect the long term appearance.     Electronically signed by: Clance Boll, PA-C 12/15/2021 2:24 PM

## 2021-12-15 NOTE — H&P (View-Only) (Signed)
Patient ID: Monica Benjamin, female    DOB: 08/24/1980, 41 y.o.   MRN: [id number]  Chief Complaint  Patient presents with   Pre-op Exam      ICD-10-CM   1. Symptomatic mammary hypertrophy  N62        History of Present Illness: Monica Benjamin is a 41 y.o.  female  with a history of macromastia.  She presents for preoperative evaluation for upcoming procedure, Bilateral Breast Reduction with liposuction, scheduled for 12/31/21 with Dr.  Marla Roe  Patient reports that she has had some level of sedation in the past, but not general anesthesia.  She reports that she did okay with what she had in the past.  Patient reports that when she got her first mammogram at 41 years old, there is a lesion.  She states that this was found to be benign.  She states that she was getting mammograms every 6 months to monitor it.  She states that nothing ever came of it, and now she has been cleared to just get yearly mammograms  She denies any personal history of breast cancer.  She reports that her grandmother had breast cancer.  Patient denies any cardiac disease.  She denies taking any blood thinners.  Patient reports she is not a smoker.  Patient denies being on any birth control or hormone replacement.  She denies any history of miscarriages.  She denies any personal or family history of blood clots or clotting diseases.  She denies any recent traumas, surgeries, infections or hospitalizations.  She denies any history of stroke or heart attack.  Patient denies any history of Crohn's disease or ulcerative colitis.  She does report she has asthma.  She denies any personal history of cancer.  She denies any recent fevers, chills or shortness of breath.  She denies any recent changes in her health.  Patient reports that she is currently a DDD cup and would like to be a C cup.  I discussed the limitation of hemorrhage tissue can be removed during surgery.  Patient expressed understanding.  Summary  of Previous Visit: Patient was seen by Dr. Marla Roe on 08/08/2021.  At this visit, patient reported back and neck pain related to enlarged breast.  She was found to have an STN of 33 on the left and right.  Her BMI was 36.6 kg/m.  Her bra size was a DD/DDD cup.  Patient at this visit reported she would like to be a C cup.  Patient was found to be a good candidate for breast reduction.  Plan was for patient to follow-up in 3 months to follow-up on her weight loss.  Patient later had a telephone encounter on 10/03/2021.  Plan was for patient to continue to work on healthy eating and weight loss between the visit and surgery, but plan overall is to move ahead with surgery.  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.  She states that she is planning to take 1 week off of work, and then work from home for 1 week.  PMH Significant for: Hypertension, GERD, OSA -wears CPAP   Past Medical History: Allergies: Allergies  Allergen Reactions   Ace Inhibitors Cough   Nsaids Swelling    Lips and face Other reaction(s): Unknown   Prednisone     Leg cramping    Current Medications:  Current Outpatient Medications:  albuterol (VENTOLIN HFA) 108 (90 Base) MCG/ACT inhaler, Inhale 2 puffs into the lungs every 4 (four) hours as needed (cough, shortness of breath or wheezing.)., Disp: 18 g, Rfl: 1   brompheniramine-pseudoephedrine-DM 30-2-10 MG/5ML syrup, Take 5 mLs by mouth 4 (four) times daily as needed., Disp: 120 mL, Rfl: 0   budesonide-formoterol (SYMBICORT) 80-4.5 MCG/ACT inhaler, Inhale 2 puffs into the lungs 2 (two) times daily., Disp: 10.2 g, Rfl: 3   cephALEXin (KEFLEX) 500 MG capsule, Take 1 capsule (500 mg total) by mouth 4 (four) times daily for 3 days., Disp: 12 capsule, Rfl: 0   cetirizine (ZYRTEC) 10 MG tablet, Take 10 mg by mouth daily., Disp: , Rfl:    clonazePAM (KLONOPIN) 0.5 MG  tablet, Take 0.5-1 tablets (0.25-0.5 mg total) by mouth daily as needed for anxiety, Disp: 20 tablet, Rfl: 1   fluconazole (DIFLUCAN) 150 MG tablet, Take 1 tablet (150 mg total) by mouth daily., Disp: 1 tablet, Rfl: 0   fluticasone (FLONASE) 50 MCG/ACT nasal spray, Place 2 sprays into both nostrils daily., Disp: 16 g, Rfl: 0   hydrochlorothiazide (HYDRODIURIL) 25 MG tablet, Take 1 tablet (25 mg total) by mouth daily., Disp: 90 tablet, Rfl: 3   Omega-3 Fatty Acids (FISH OIL) 1000 MG CAPS, Take 1 capsule by mouth daily., Disp: , Rfl:    ondansetron (ZOFRAN) 4 MG tablet, Take 1 tablet (4 mg total) by mouth every 8 (eight) hours as needed for up to 20 doses for nausea or vomiting., Disp: 20 tablet, Rfl: 0   oxyCODONE (ROXICODONE) 5 MG immediate release tablet, Take 1 tablet (5 mg total) by mouth every 8 (eight) hours as needed for up to 20 doses for severe pain., Disp: 20 tablet, Rfl: 0   pantoprazole (PROTONIX) 20 MG tablet, Take 1 tablet (20 mg total) by mouth daily., Disp: 90 tablet, Rfl: 3   Vitamin D, Ergocalciferol, (DRISDOL) 1.25 MG (50000 UNIT) CAPS capsule, Take 1 capsule (50,000 Units total) by mouth every 7 (seven) days., Disp: 4 capsule, Rfl: 0   vortioxetine HBr (TRINTELLIX) 10 MG TABS tablet, Take 1 tablet (10 mg total) by mouth daily., Disp: 90 tablet, Rfl: 3  Past Medical Problems: Past Medical History:  Diagnosis Date   Allergy    Anxiety    Asthma    Back pain    Depression    GERD (gastroesophageal reflux disease)    Hyperlipidemia    Hypertension    IBS (irritable bowel syndrome)    Infertility, female 02/15/2017   IVF treatment   Lumbar herniated disc    L4-L5   Migraines    Other fatigue    Shortness of breath on exertion    Sleep apnea    Trigeminal neuralgia    Vitamin D deficiency 01/26/2021    Past Surgical History: Past Surgical History:  Procedure Laterality Date   WISDOM TOOTH EXTRACTION      Social History: Social History   Socioeconomic History    Marital status: Married    Spouse name: Museum/gallery conservator   Number of children: 2   Years of education: Not on file   Highest education level: Not on file  Occupational History   Occupation: Energy manager: [employer]  Tobacco Use   Smoking status: Never   Smokeless tobacco: Never  Vaping Use   Vaping Use: Never used  Substance and Sexual Activity   Alcohol use: Not Currently    Comment: 2 drinks/month   Drug use: No  Sexual activity: Yes    Partners: Female    Birth control/protection: None    Comment: female  Other Topics Concern   Not on file  Social History Narrative   Married to [employer]   Social Determinants of Health   Financial Resource Strain: Not on file  Food Insecurity: Not on file  Transportation Needs: Not on file  Physical Activity: Not on file  Stress: Not on file  Social Connections: Not on file  Intimate Partner Violence: Not on file    Family History: Family History  Problem Relation Age of Onset   Anxiety disorder Mother    Osteoporosis Mother    Migraines Mother    Alcoholism Father    Hyperlipidemia Father    Hypertension Father    Migraines Sister    Dementia Maternal Grandmother    Diabetes Maternal Grandfather    COPD Paternal Grandmother    Anxiety disorder Other    Alcoholism Other    Cancer Neg Hx    Heart disease Neg Hx     Review of Systems: Denies any recent fevers, chills or shortness of breath.  She denies any recent changes in her health. Physical Exam: Vital Signs BP 135/85 (BP Location: Left Arm, Patient Position: Sitting, Cuff Size: Large)   Pulse 69   Ht '5\' 4"'$  (1.626 m)   Wt 219 lb 12.8 oz (99.7 kg)   SpO2 98%   BMI 37.73 kg/m   Physical Exam  Constitutional:      General: Not in acute distress.    Appearance: Normal appearance. Not ill-appearing.  HENT:     Head: Normocephalic and atraumatic.  Neck:     Musculoskeletal: Normal range of motion.  Cardiovascular:     Rate and Rhythm: Normal rate   Pulmonary:     Effort: Pulmonary effort is normal. No respiratory distress.  Musculoskeletal: Normal range of motion.  Lower Extremities: Nonswollen, no varicose veins noted Skin:    General: Skin is warm and dry.     Findings: No erythema or rash.  Neurological:     Mental Status: Alert and oriented to person, place, and time. Mental status is at baseline.  Psychiatric:        Mood and Affect: Mood normal.        Behavior: Behavior normal.    Assessment/Plan: The patient is scheduled for bilateral breast reduction with Dr. Marla Roe.  Risks, benefits, and alternatives of procedure discussed, questions answered and consent obtained.    Smoking Status: Non-smoker; Counseling Given?  N/A Last Mammogram: 02/21/2021; Results: BI-RADS Category 2: Benign  I discussed with the patient that she can get a mammogram 6 months after the surgery.  I discussed with the patient that she should check with her primary care provider who orders her mammograms to make sure that they are okay waiting another 6 months for the patient to get a mammogram.  Patient expressed understanding.  Caprini Score: 4; Risk Factors include: Age, BMI greater than 25, and length of planned surgery. Recommendation for mechanical prophylaxis. Encourage early ambulation.   Pictures obtained: '@consult'$   Post-op Rx sent to pharmacy: Oxycodone, Zofran, Keflex  I discussed with the patient to hold her Klonopin and hydrochlorothiazide the morning of surgery.  I discussed with the patient to hold vitamins and supplements 1 week prior to surgery.  Patient was provided with the breast reduction and General Surgical Risk consent document and Pain Medication Agreement prior to their appointment.  They had adequate time to read through  the risk consent documents and Pain Medication Agreement. We also discussed them in person together during this preop appointment. All of their questions were answered to their satisfaction.  Recommended  calling if they have any further questions.  Risk consent form and Pain Medication Agreement to be scanned into patient's chart.  The risk that can be encountered with breast reduction were discussed and include the following but not limited to these:  Breast asymmetry, fluid accumulation, firmness of the breast, inability to breast feed, loss of nipple or areola, skin loss, decrease or no nipple sensation, fat necrosis of the breast tissue, bleeding, infection, healing delay.  There are risks of anesthesia, changes to skin sensation and injury to nerves or blood vessels.  The muscle can be temporarily or permanently injured.  You may have an allergic reaction to tape, suture, glue, blood products which can result in skin discoloration, swelling, pain, skin lesions, poor healing.  Any of these can lead to the need for revisonal surgery or stage procedures.  A reduction has potential to interfere with diagnostic procedures.  Nipple or breast piercing can increase risks of infection.  This procedure is best done when the breast is fully developed.  Changes in the breast will continue to occur over time.  Pregnancy can alter the outcomes of previous breast reduction surgery, weight gain and weigh loss can also effect the long term appearance.     Electronically signed by: Clance Boll, PA-C 12/15/2021 2:24 PM

## 2021-12-17 ENCOUNTER — Telehealth: Payer: 59 | Admitting: Family Medicine

## 2021-12-17 DIAGNOSIS — J019 Acute sinusitis, unspecified: Secondary | ICD-10-CM | POA: Diagnosis not present

## 2021-12-17 DIAGNOSIS — B9689 Other specified bacterial agents as the cause of diseases classified elsewhere: Secondary | ICD-10-CM | POA: Diagnosis not present

## 2021-12-18 MED ORDER — AMOXICILLIN-POT CLAVULANATE 875-125 MG PO TABS: 1.0000 | ORAL_TABLET | Freq: Two times a day (BID) | ORAL | 0 refills | Status: AC

## 2021-12-18 NOTE — Progress Notes (Signed)

## 2021-12-24 ENCOUNTER — Other Ambulatory Visit: Payer: Self-pay

## 2021-12-24 ENCOUNTER — Encounter (HOSPITAL_BASED_OUTPATIENT_CLINIC_OR_DEPARTMENT_OTHER): Payer: Self-pay | Admitting: Plastic Surgery

## 2021-12-25 ENCOUNTER — Encounter (HOSPITAL_BASED_OUTPATIENT_CLINIC_OR_DEPARTMENT_OTHER)
Admission: RE | Admit: 2021-12-25 | Discharge: 2021-12-25 | Disposition: A | Discharge status: Home / Self Care | Payer: 59 | Source: Ambulatory Visit | Attending: Plastic Surgery | Admitting: Plastic Surgery

## 2021-12-25 DIAGNOSIS — I10 Essential (primary) hypertension: Secondary | ICD-10-CM | POA: Diagnosis not present

## 2021-12-25 DIAGNOSIS — Z01812 Encounter for preprocedural laboratory examination: Secondary | ICD-10-CM | POA: Diagnosis not present

## 2021-12-25 LAB — BASIC METABOLIC PANEL
Anion gap: 7 (ref 5–15)
BUN: 8 mg/dL (ref 6–20)
CO2: 28 mmol/L (ref 22–32)
Calcium: 9.1 mg/dL (ref 8.9–10.3)
Chloride: 101 mmol/L (ref 98–111)
Creatinine, Ser: 0.77 mg/dL (ref 0.44–1.00)
GFR, Estimated: >60 mL/min (ref >60)
Glucose, Bld: 123 mg/dL — ABNORMAL HIGH (ref 70–99)
Potassium: 3.5 mmol/L (ref 3.5–5.1)
Sodium: 136 mmol/L (ref 135–145)

## 2021-12-31 ENCOUNTER — Ambulatory Visit (HOSPITAL_BASED_OUTPATIENT_CLINIC_OR_DEPARTMENT_OTHER): Payer: 59 | Admitting: Anesthesiology

## 2021-12-31 ENCOUNTER — Encounter (HOSPITAL_BASED_OUTPATIENT_CLINIC_OR_DEPARTMENT_OTHER)
Admission: RE | Disposition: A | Discharge status: Home / Self Care | Payer: Self-pay | Source: Ambulatory Visit | Attending: Plastic Surgery

## 2021-12-31 ENCOUNTER — Other Ambulatory Visit: Payer: Self-pay

## 2021-12-31 ENCOUNTER — Encounter (HOSPITAL_BASED_OUTPATIENT_CLINIC_OR_DEPARTMENT_OTHER): Payer: Self-pay | Admitting: Plastic Surgery

## 2021-12-31 ENCOUNTER — Ambulatory Visit (HOSPITAL_BASED_OUTPATIENT_CLINIC_OR_DEPARTMENT_OTHER)
Admission: RE | Admit: 2021-12-31 | Discharge: 2021-12-31 | Disposition: A | Discharge status: Home / Self Care | Payer: 59 | Source: Ambulatory Visit | Attending: Plastic Surgery | Admitting: Plastic Surgery

## 2021-12-31 DIAGNOSIS — N6012 Diffuse cystic mastopathy of left breast: Secondary | ICD-10-CM | POA: Diagnosis not present

## 2021-12-31 DIAGNOSIS — M542 Cervicalgia: Secondary | ICD-10-CM | POA: Insufficient documentation

## 2021-12-31 DIAGNOSIS — I10 Essential (primary) hypertension: Secondary | ICD-10-CM | POA: Insufficient documentation

## 2021-12-31 DIAGNOSIS — N62 Hypertrophy of breast: Secondary | ICD-10-CM | POA: Insufficient documentation

## 2021-12-31 DIAGNOSIS — M549 Dorsalgia, unspecified: Secondary | ICD-10-CM | POA: Insufficient documentation

## 2021-12-31 DIAGNOSIS — K219 Gastro-esophageal reflux disease without esophagitis: Secondary | ICD-10-CM | POA: Insufficient documentation

## 2021-12-31 DIAGNOSIS — N6011 Diffuse cystic mastopathy of right breast: Secondary | ICD-10-CM | POA: Diagnosis not present

## 2021-12-31 DIAGNOSIS — Z01818 Encounter for other preprocedural examination: Secondary | ICD-10-CM

## 2021-12-31 HISTORY — PX: BREAST REDUCTION SURGERY: SHX8

## 2021-12-31 LAB — POCT PREGNANCY, URINE: Preg Test, Ur: NEGATIVE

## 2021-12-31 SURGERY — BREAST REDUCTION WITH LIPOSUCTION
Anesthesia: General | Site: Breast | Laterality: Bilateral

## 2021-12-31 MED ORDER — OXYCODONE HCL 5 MG PO TABS: 5.0000 mg | ORAL_TABLET | Freq: Once | ORAL | Status: DC | PRN

## 2021-12-31 MED ORDER — ROCURONIUM BROMIDE 100 MG/10ML IV SOLN
INTRAVENOUS | Status: DC | PRN
  Administered 2021-12-31: 80 mg via INTRAVENOUS

## 2021-12-31 MED ORDER — PROPOFOL 10 MG/ML IV BOLUS
INTRAVENOUS | Status: DC | PRN
  Administered 2021-12-31: 200 mg via INTRAVENOUS

## 2021-12-31 MED ORDER — DEXAMETHASONE SODIUM PHOSPHATE 4 MG/ML IJ SOLN
INTRAMUSCULAR | Status: DC | PRN
  Administered 2021-12-31: 55 mg via INTRAVENOUS

## 2021-12-31 MED ORDER — CEFAZOLIN SODIUM-DEXTROSE 2-4 GM/100ML-% IV SOLN
2.0000 g | INTRAVENOUS | Status: AC
  Administered 2021-12-31: 2 g via INTRAVENOUS

## 2021-12-31 MED ORDER — HYDROMORPHONE HCL 1 MG/ML IJ SOLN
0.2500 mg | INTRAMUSCULAR | Status: DC | PRN
  Administered 2021-12-31: 0.5 mg via INTRAVENOUS

## 2021-12-31 MED ORDER — OXYCODONE HCL 5 MG PO TABS: 5.0000 mg | ORAL_TABLET | ORAL | Status: DC | PRN

## 2021-12-31 MED ORDER — LIDOCAINE-EPINEPHRINE 1 %-1:100000 IJ SOLN
INTRAMUSCULAR | Status: DC | PRN
  Administered 2021-12-31: 50 mL via INTRAMUSCULAR

## 2021-12-31 MED ORDER — ONDANSETRON HCL 4 MG/2ML IJ SOLN
4.0000 mg | Freq: Once | INTRAMUSCULAR | Status: AC | PRN
  Administered 2021-12-31: 4 mg via INTRAVENOUS

## 2021-12-31 MED ORDER — SODIUM CHLORIDE 0.9% FLUSH: 3.0000 mL | Freq: Two times a day (BID) | INTRAVENOUS | Status: DC

## 2021-12-31 MED ORDER — EPHEDRINE 5 MG/ML INJ
INTRAVENOUS | Status: AC
  Filled 2021-12-31: qty 5

## 2021-12-31 MED ORDER — ONDANSETRON HCL 4 MG/2ML IJ SOLN
INTRAMUSCULAR | Status: AC
  Filled 2021-12-31: qty 2

## 2021-12-31 MED ORDER — DEXAMETHASONE SODIUM PHOSPHATE 10 MG/ML IJ SOLN
INTRAMUSCULAR | Status: AC
  Filled 2021-12-31: qty 1

## 2021-12-31 MED ORDER — ACETAMINOPHEN 325 MG PO TABS: 650.0000 mg | ORAL_TABLET | ORAL | Status: DC | PRN

## 2021-12-31 MED ORDER — HYDROMORPHONE HCL 1 MG/ML IJ SOLN
INTRAMUSCULAR | Status: DC | PRN
  Administered 2021-12-31: .5 mg via INTRAVENOUS

## 2021-12-31 MED ORDER — LIDOCAINE 2% (20 MG/ML) 5 ML SYRINGE
INTRAMUSCULAR | Status: AC
  Filled 2021-12-31: qty 5

## 2021-12-31 MED ORDER — SCOPOLAMINE 1 MG/3DAYS TD PT72
1.0000 | MEDICATED_PATCH | Freq: Once | TRANSDERMAL | Status: DC
  Administered 2021-12-31: 1.5 mg via TRANSDERMAL

## 2021-12-31 MED ORDER — CHLORHEXIDINE GLUCONATE CLOTH 2 % EX PADS: 6.0000 | MEDICATED_PAD | Freq: Once | CUTANEOUS | Status: DC

## 2021-12-31 MED ORDER — DROPERIDOL 2.5 MG/ML IJ SOLN
INTRAMUSCULAR | Status: DC | PRN
  Administered 2021-12-31: .625 mg via INTRAVENOUS

## 2021-12-31 MED ORDER — LIDOCAINE HCL (CARDIAC) PF 100 MG/5ML IV SOSY
PREFILLED_SYRINGE | INTRAVENOUS | Status: DC | PRN
  Administered 2021-12-31: 80 mg via INTRAVENOUS

## 2021-12-31 MED ORDER — SCOPOLAMINE 1 MG/3DAYS TD PT72
MEDICATED_PATCH | TRANSDERMAL | Status: AC
  Filled 2021-12-31: qty 1

## 2021-12-31 MED ORDER — HYDROMORPHONE HCL 1 MG/ML IJ SOLN
INTRAMUSCULAR | Status: AC
  Filled 2021-12-31: qty 0.5

## 2021-12-31 MED ORDER — MIDAZOLAM HCL 5 MG/5ML IJ SOLN
INTRAMUSCULAR | Status: DC | PRN
  Administered 2021-12-31: 2 mg via INTRAVENOUS

## 2021-12-31 MED ORDER — ATROPINE SULFATE 0.4 MG/ML IV SOLN
INTRAVENOUS | Status: AC
  Filled 2021-12-31: qty 1

## 2021-12-31 MED ORDER — SUCCINYLCHOLINE CHLORIDE 200 MG/10ML IV SOSY
PREFILLED_SYRINGE | INTRAVENOUS | Status: AC
  Filled 2021-12-31: qty 10

## 2021-12-31 MED ORDER — ACETAMINOPHEN 10 MG/ML IV SOLN
INTRAVENOUS | Status: AC
  Filled 2021-12-31: qty 100

## 2021-12-31 MED ORDER — CEFAZOLIN SODIUM-DEXTROSE 2-4 GM/100ML-% IV SOLN
INTRAVENOUS | Status: AC
  Filled 2021-12-31: qty 100

## 2021-12-31 MED ORDER — PHENYLEPHRINE HCL (PRESSORS) 10 MG/ML IV SOLN
INTRAVENOUS | Status: DC | PRN
  Administered 2021-12-31: 80 ug via INTRAVENOUS

## 2021-12-31 MED ORDER — ALBUTEROL SULFATE HFA 108 (90 BASE) MCG/ACT IN AERS
INHALATION_SPRAY | RESPIRATORY_TRACT | Status: DC | PRN
  Administered 2021-12-31 (×2): 2 via RESPIRATORY_TRACT

## 2021-12-31 MED ORDER — OXYCODONE HCL 5 MG/5ML PO SOLN: 5.0000 mg | Freq: Once | ORAL | Status: DC | PRN

## 2021-12-31 MED ORDER — FENTANYL CITRATE (PF) 100 MCG/2ML IJ SOLN: 25.0000 ug | INTRAMUSCULAR | Status: DC | PRN

## 2021-12-31 MED ORDER — LACTATED RINGERS IV SOLN: INTRAVENOUS | Status: DC

## 2021-12-31 MED ORDER — ACETAMINOPHEN 325 MG RE SUPP: 650.0000 mg | RECTAL | Status: DC | PRN

## 2021-12-31 MED ORDER — FENTANYL CITRATE (PF) 100 MCG/2ML IJ SOLN
INTRAMUSCULAR | Status: DC | PRN
  Administered 2021-12-31: 100 ug via INTRAVENOUS

## 2021-12-31 MED ORDER — SODIUM CHLORIDE 0.9 % IV SOLN: 250.0000 mL | INTRAVENOUS | Status: DC | PRN

## 2021-12-31 MED ORDER — SODIUM CHLORIDE 0.9% FLUSH: 3.0000 mL | INTRAVENOUS | Status: DC | PRN

## 2021-12-31 MED ORDER — EPHEDRINE SULFATE (PRESSORS) 50 MG/ML IJ SOLN
INTRAMUSCULAR | Status: DC | PRN
  Administered 2021-12-31: 10 mg via INTRAVENOUS

## 2021-12-31 MED ORDER — SUGAMMADEX SODIUM 200 MG/2ML IV SOLN
INTRAVENOUS | Status: DC | PRN
  Administered 2021-12-31: 200 mg via INTRAVENOUS

## 2021-12-31 MED ORDER — ONDANSETRON HCL 4 MG/2ML IJ SOLN
INTRAMUSCULAR | Status: DC | PRN
  Administered 2021-12-31: 4 mg via INTRAVENOUS

## 2021-12-31 MED ORDER — DROPERIDOL 2.5 MG/ML IJ SOLN
INTRAMUSCULAR | Status: AC
  Filled 2021-12-31: qty 2

## 2021-12-31 MED ORDER — ACETAMINOPHEN 10 MG/ML IV SOLN
INTRAVENOUS | Status: DC | PRN
  Administered 2021-12-31: 1000 mg via INTRAVENOUS

## 2021-12-31 MED ORDER — PHENYLEPHRINE 80 MCG/ML (10ML) SYRINGE FOR IV PUSH (FOR BLOOD PRESSURE SUPPORT)
PREFILLED_SYRINGE | INTRAVENOUS | Status: AC
  Filled 2021-12-31: qty 10

## 2021-12-31 MED ORDER — FENTANYL CITRATE (PF) 100 MCG/2ML IJ SOLN
INTRAMUSCULAR | Status: AC
  Filled 2021-12-31: qty 2

## 2021-12-31 MED ORDER — LIDOCAINE HCL 1 % IJ SOLN
INTRAVENOUS | Status: DC | PRN
  Administered 2021-12-31: 600 mL

## 2021-12-31 MED ORDER — MIDAZOLAM HCL 2 MG/2ML IJ SOLN
INTRAMUSCULAR | Status: AC
  Filled 2021-12-31: qty 2

## 2021-12-31 SURGICAL SUPPLY — 63 items
ADH SKN CLS APL DERMABOND .7 (GAUZE/BANDAGES/DRESSINGS) ×2
BAG DECANTER FOR FLEXI CONT (MISCELLANEOUS) ×1 IMPLANT
BINDER BREAST LRG (GAUZE/BANDAGES/DRESSINGS) IMPLANT
BINDER BREAST MEDIUM (GAUZE/BANDAGES/DRESSINGS) IMPLANT
BINDER BREAST XLRG (GAUZE/BANDAGES/DRESSINGS) IMPLANT
BINDER BREAST XXLRG (GAUZE/BANDAGES/DRESSINGS) IMPLANT
BIOPATCH RED 1 DISK 7.0 (GAUZE/BANDAGES/DRESSINGS) IMPLANT
BLADE HEX COATED 2.75 (ELECTRODE) IMPLANT
BLADE KNIFE PERSONA 10 (BLADE) ×2 IMPLANT
BLADE SURG 15 STRL LF DISP TIS (BLADE) ×1 IMPLANT
BLADE SURG 15 STRL SS (BLADE) ×1
CANISTER SUCT 1200ML W/VALVE (MISCELLANEOUS) ×1 IMPLANT
COVER BACK TABLE 60X90IN (DRAPES) ×1 IMPLANT
COVER MAYO STAND STRL (DRAPES) ×1 IMPLANT
DERMABOND ADVANCED .7 DNX12 (GAUZE/BANDAGES/DRESSINGS) ×2 IMPLANT
DRAIN CHANNEL 19F RND (DRAIN) IMPLANT
DRAPE LAPAROSCOPIC ABDOMINAL (DRAPES) ×1 IMPLANT
DRSG OPSITE POSTOP 4X12 (GAUZE/BANDAGES/DRESSINGS) IMPLANT
DRSG OPSITE POSTOP 4X6 (GAUZE/BANDAGES/DRESSINGS) ×1 IMPLANT
ELECT BLADE 4.0 EZ CLEAN MEGAD (MISCELLANEOUS) ×1
ELECT REM PT RETURN 9FT ADLT (ELECTROSURGICAL) ×1
ELECTRODE BLDE 4.0 EZ CLN MEGD (MISCELLANEOUS) ×1 IMPLANT
ELECTRODE REM PT RTRN 9FT ADLT (ELECTROSURGICAL) ×1 IMPLANT
EVACUATOR SILICONE 100CC (DRAIN) IMPLANT
GAUZE PAD ABD 8X10 STRL (GAUZE/BANDAGES/DRESSINGS) ×2 IMPLANT
GLOVE BIO SURGEON STRL SZ 6.5 (GLOVE) ×3 IMPLANT
GLOVE BIO SURGEON STRL SZ7.5 (GLOVE) ×1 IMPLANT
GOWN STRL REUS W/ TWL LRG LVL3 (GOWN DISPOSABLE) ×2 IMPLANT
GOWN STRL REUS W/ TWL XL LVL3 (GOWN DISPOSABLE) ×1 IMPLANT
GOWN STRL REUS W/TWL LRG LVL3 (GOWN DISPOSABLE) ×4
GOWN STRL REUS W/TWL XL LVL3 (GOWN DISPOSABLE) ×1
NDL FILTER BLUNT 18X1 1/2 (NEEDLE) IMPLANT
NDL HYPO 25X1 1.5 SAFETY (NEEDLE) ×1 IMPLANT
NEEDLE FILTER BLUNT 18X1 1/2 (NEEDLE) ×1 IMPLANT
NEEDLE HYPO 25X1 1.5 SAFETY (NEEDLE) ×1 IMPLANT
NS IRRIG 1000ML POUR BTL (IV SOLUTION) ×1 IMPLANT
PACK BASIN DAY SURGERY FS (CUSTOM PROCEDURE TRAY) ×1 IMPLANT
PAD ALCOHOL SWAB (MISCELLANEOUS) IMPLANT
PAD FOAM SILICONE BACKED (GAUZE/BANDAGES/DRESSINGS) IMPLANT
PENCIL SMOKE EVACUATOR (MISCELLANEOUS) ×1 IMPLANT
PIN SAFETY STERILE (MISCELLANEOUS) IMPLANT
SLEEVE SCD COMPRESS KNEE MED (STOCKING) ×1 IMPLANT
SPIKE FLUID TRANSFER (MISCELLANEOUS) IMPLANT
SPONGE T-LAP 18X18 ~~LOC~~+RFID (SPONGE) ×2 IMPLANT
STRIP SUTURE WOUND CLOSURE 1/2 (MISCELLANEOUS) ×2 IMPLANT
SUT MNCRL AB 4-0 PS2 18 (SUTURE) ×4 IMPLANT
SUT MON AB 3-0 SH 27 (SUTURE) ×4
SUT MON AB 3-0 SH27 (SUTURE) ×4 IMPLANT
SUT MON AB 5-0 PS2 18 (SUTURE) IMPLANT
SUT PDS 3-0 CT2 (SUTURE) ×4
SUT PDS II 3-0 CT2 27 ABS (SUTURE) ×4 IMPLANT
SUT SILK 3 0 PS 1 (SUTURE) IMPLANT
SYR 50ML LL SCALE MARK (SYRINGE) IMPLANT
SYR BULB IRRIG 60ML STRL (SYRINGE) ×1 IMPLANT
SYR CONTROL 10ML LL (SYRINGE) ×1 IMPLANT
TAPE MEASURE VINYL STERILE (MISCELLANEOUS) IMPLANT
TOWEL GREEN STERILE FF (TOWEL DISPOSABLE) ×2 IMPLANT
TRAY DSU PREP LF (CUSTOM PROCEDURE TRAY) ×1 IMPLANT
TUBE CONNECTING 20X1/4 (TUBING) ×1 IMPLANT
TUBING INFILTRATION IT-10001 (TUBING) IMPLANT
TUBING SET GRADUATE ASPIR 12FT (MISCELLANEOUS) IMPLANT
UNDERPAD 30X36 HEAVY ABSORB (UNDERPADS AND DIAPERS) ×2 IMPLANT
YANKAUER SUCT BULB TIP NO VENT (SUCTIONS) ×1 IMPLANT

## 2021-12-31 NOTE — Anesthesia Procedure Notes (Signed)
Procedure Name: Intubation Date/Time: 12/31/2021 8:43 AM  Performed by: Willa Frater, CRNAPre-anesthesia Checklist: Patient identified, Emergency Drugs available, Suction available and Patient being monitored Patient Re-evaluated:Patient Re-evaluated prior to induction Oxygen Delivery Method: Circle system utilized Preoxygenation: Pre-oxygenation with 100% oxygen Induction Type: IV induction Ventilation: Mask ventilation without difficulty Laryngoscope Size: Mac and 3 Grade View: Grade I Tube type: Oral Tube size: 7.0 mm Number of attempts: 1 Airway Equipment and Method: Stylet and Oral airway Placement Confirmation: ETT inserted through vocal cords under direct vision, positive ETCO2 and breath sounds checked- equal and bilateral Secured at: 22 cm Tube secured with: Tape Dental Injury: Teeth and Oropharynx as per pre-operative assessment

## 2021-12-31 NOTE — Anesthesia Postprocedure Evaluation (Signed)
Anesthesia Post Note  Patient: Monica Benjamin  Procedure(s) Performed: BREAST REDUCTION WITH LIPOSUCTION (Bilateral: Breast)     Patient location during evaluation: PACU Anesthesia Type: General Level of consciousness: awake and alert Pain management: pain level controlled Vital Signs Assessment: post-procedure vital signs reviewed and stable Respiratory status: spontaneous breathing, nonlabored ventilation, respiratory function stable and patient connected to nasal cannula oxygen Cardiovascular status: blood pressure returned to baseline and stable Postop Assessment: no apparent nausea or vomiting Anesthetic complications: no  No notable events documented.  Last Vitals:  Vitals:   12/31/21 1258 12/31/21 1323  BP: (!) 103/91 116/70  Pulse: (!) 105 92  Resp: 18   Temp: 36.9 C   SpO2: 96% 96%    Last Pain:  Vitals:   12/31/21 1258  TempSrc: Oral  PainSc: 0-No pain                 Khaliel Morey S

## 2021-12-31 NOTE — Interval H&P Note (Signed)
History and Physical Interval Note:  12/31/2021 7:37 AM  Monica Benjamin  has presented today for surgery, with the diagnosis of Symptomatic mammary hypertrophy.  The various methods of treatment have been discussed with the patient and family. After consideration of risks, benefits and other options for treatment, the patient has consented to  Procedure(s): BREAST REDUCTION WITH LIPOSUCTION (Bilateral) as a surgical intervention.  The patient's history has been reviewed, patient examined, no change in status, stable for surgery.  I have reviewed the patient's chart and labs.  Questions were answered to the patient's satisfaction.     Loel Lofty Swan Zayed

## 2021-12-31 NOTE — Discharge Instructions (Addendum)
Post Anesthesia Home Care Instructions  Activity: Get plenty of rest for the remainder of the day. A responsible individual must stay with you for 24 hours following the procedure.  For the next 24 hours, DO NOT: -Drive a car -Paediatric nurse -Drink alcoholic beverages -Take any medication unless instructed by your physician -Make any legal decisions or sign important papers.  Meals: Start with liquid foods such as gelatin or soup. Progress to regular foods as tolerated. Avoid greasy, spicy, heavy foods. If nausea and/or vomiting occur, drink only clear liquids until the nausea and/or vomiting subsides. Call your physician if vomiting continues.  Special Instructions/Symptoms: Your throat may feel dry or sore from the anesthesia or the breathing tube placed in your throat during surgery. If this causes discomfort, gargle with warm salt water. The discomfort should disappear within 24 hours.  If you had a scopolamine patch placed behind your ear for the management of post- operative nausea and/or vomiting:  1. The medication in the patch is effective for 72 hours, after which it should be removed.  Wrap patch in a tissue and discard in the trash. Wash hands thoroughly with soap and water. 2. You may remove the patch earlier than 72 hours if you experience unpleasant side effects which may include dry mouth, dizziness or visual disturbances. 3. Avoid touching the patch. Wash your hands with soap and water after contact with the patch.    Post Anesthesia Home Care Instructions  Activity: Get plenty of rest for the remainder of the day. A responsible individual must stay with you for 24 hours following the procedure.  For the next 24 hours, DO NOT: -Drive a car -Paediatric nurse -Drink alcoholic beverages -Take any medication unless instructed by your physician -Make any legal decisions or sign important papers.  Meals: Start with liquid foods such as gelatin or soup. Progress to  regular foods as tolerated. Avoid greasy, spicy, heavy foods. If nausea and/or vomiting occur, drink only clear liquids until the nausea and/or vomiting subsides. Call your physician if vomiting continues.  Special Instructions/Symptoms: Your throat may feel dry or sore from the anesthesia or the breathing tube placed in your throat during surgery. If this causes discomfort, gargle with warm salt water. The discomfort should disappear within 24 hours.  If you had a scopolamine patch placed behind your ear for the management of post- operative nausea and/or vomiting:  1. The medication in the patch is effective for 72 hours, after which it should be removed.  Wrap patch in a tissue and discard in the trash. Wash hands thoroughly with soap and water. 2. You may remove the patch earlier than 72 hours if you experience unpleasant side effects which may include dry mouth, dizziness or visual disturbances. 3. Avoid touching the patch. Wash your hands with soap and water after contact with the patch.   INSTRUCTIONS FOR AFTER BREAST SURGERY   You will likely have some questions about what to expect following your operation.  The following information will help you and your family understand what to expect when you are discharged from the hospital.  It is important to follow these guidelines to help ensure a smooth recovery and reduce complication.  Postoperative instructions include information on: diet, wound care, medications and physical activity.  AFTER SURGERY Expect to go home after the procedure.  In some cases, you may need to spend one night in the hospital for observation.  DIET Breast surgery does not require a specific diet.  However, the  healthier you eat the better your body will heal. It is important to increasing your protein intake.  This means limiting the foods with sugar and carbohydrates.  Focus on vegetables and some meat.  If you have liposuction during your procedure be sure to  drink water.  If your urine is bright yellow, then it is concentrated, and you need to drink more water.  As a general rule after surgery, you should have 8 ounces of water every hour while awake.  If you find you are persistently nauseated or unable to take in liquids let us know.  NO TOBACCO USE or EXPOSURE.  This will slow your healing process and lead to a wound.  WOUND CARE Leave the binder on for 3 days . Use fragrance free soap like Dial, Dove or [place].   After 3 days you can remove the binder to shower. Once dry apply binder or sports bra. If you have liposuction you will have a soft and spongy dressing (Lipofoam) that helps prevent creases in your skin.  Remove before you shower and then replace it.  It is also available on [employer]. If you have steri-strips / tape directly attached to your skin leave them in place. It is OK to get these wet.   No baths, pools or hot tubs for four weeks. We close your incision to leave the smallest and best-looking scar. No ointment or creams on your incisions for four weeks.  No Neosporin (Too many skin reactions).  A few weeks after surgery you can use Mederma and start massaging the scar. We ask you to wear your binder or sports bra for the first 6 weeks around the clock, including while sleeping. This provides added comfort and helps reduce the fluid accumulation at the surgery site. No Ice or heating pads to the operative site.  You have a very high risk of a burn before you feel the temperature change.  ACTIVITY No heavy lifting until cleared by the doctor.  This usually means no more than a half-gallon of milk.  It is OK to walk and climb stairs. Moving your legs is very important to decrease your risk of a blood clot.  It will also help keep you from getting deconditioned.  Every 1 to 2 hours get up and walk for 5 minutes. This will help with a quicker recovery back to normal.  Let pain be your guide so you don't do too much.  This time is for you to  recover.  You will be more comfortable if you sleep and rest with your head elevated either with a few pillows under you or in a recliner.  No stomach sleeping for a three months.  WORK Everyone returns to work at different times. As a rough guide, most people take at least 1 - 2 weeks off prior to returning to work. If you need documentation for your job, give the forms to the front staff at the clinic.  DRIVING Arrange for someone to bring you home from the hospital after your surgery.  You may be able to drive a few days after surgery but not while taking any narcotics or valium.  BOWEL MOVEMENTS Constipation can occur after anesthesia and while taking pain medication.  It is important to stay ahead for your comfort.  We recommend taking Milk of Magnesia (2 tablespoons; twice a day) while taking the pain pills.  MEDICATIONS You may be prescribed should start after surgery At your preoperative visit for you history and  physical you may have been given the following medications: An antibiotic: Start this medication when you get home and take according to the instructions on the bottle. Zofran 4 mg:  This is to treat nausea and vomiting.  You can take this every 6 hours as needed and only if needed. Valium 2 mg for breast cancer patients: This is for muscle tightness if you have an implant or expander. This will help relax your muscle which also helps with pain control.  This can be taken every 12 hours as needed. Don't drive after taking this medication. Norco (hydrocodone/acetaminophen) 5/325 mg:  This is only to be used after you have taken the Motrin or the Tylenol. Every 8 hours as needed.next tylenol dose after 1300.   Over the counter Medication to take: Ibuprofen (Motrin) 600 mg:  Take this every 6 hours.  If you have additional pain then take 500 mg of the Tylenol every 8 hours.  Only take the Norco after you have tried these two. MiraLAX or Milk of Magnesia: Take this according to the  bottle if you take the [place] Call your surgeon's office if any of the following occur: Fever 101 degrees F or greater Excessive bleeding or fluid from the incision site. Pain that increases over time without aid from the medications Redness, warmth, or pus draining from incision sites Persistent nausea or inability to take in liquids Severe misshapen area that underwent the operation.  No tylenol until after 3pm today if needed.

## 2021-12-31 NOTE — Anesthesia Preprocedure Evaluation (Signed)
Anesthesia Evaluation  Patient identified by MRN, date of birth, ID band Patient awake    Reviewed: Allergy & Precautions, H&P , NPO status , Patient's Chart, lab work & pertinent test results  Airway Mallampati: II  TM Distance: >3 FB Neck ROM: Full    Dental no notable dental hx.    Pulmonary neg pulmonary ROS   Pulmonary exam normal breath sounds clear to auscultation       Cardiovascular hypertension, negative cardio ROS Normal cardiovascular exam Rhythm:Regular Rate:Normal     Neuro/Psych negative neurological ROS  negative psych ROS   GI/Hepatic Neg liver ROS,GERD  ,,  Endo/Other  negative endocrine ROS    Renal/GU negative Renal ROS  negative genitourinary   Musculoskeletal negative musculoskeletal ROS (+)    Abdominal   Peds negative pediatric ROS (+)  Hematology negative hematology ROS (+)   Anesthesia Other Findings   Reproductive/Obstetrics negative OB ROS                             Anesthesia Physical Anesthesia Plan  ASA: 2  Anesthesia Plan: General   Post-op Pain Management: Ofirmev IV (intra-op)*   Induction: Intravenous  PONV Risk Score and Plan: 3 and Ondansetron, Dexamethasone, Midazolam, Scopolamine patch - Pre-op and Treatment may vary due to age or medical condition  Airway Management Planned: Oral ETT  Additional Equipment:   Intra-op Plan:   Post-operative Plan: Extubation in OR  Informed Consent: I have reviewed the patients History and Physical, chart, labs and discussed the procedure including the risks, benefits and alternatives for the proposed anesthesia with the patient or authorized representative who has indicated his/her understanding and acceptance.     Dental advisory given  Plan Discussed with: CRNA and Surgeon  Anesthesia Plan Comments:        Anesthesia Quick Evaluation

## 2021-12-31 NOTE — Transfer of Care (Signed)
Immediate Anesthesia Transfer of Care Note  Patient: Monica Benjamin  Procedure(s) Performed: BREAST REDUCTION WITH LIPOSUCTION (Bilateral: Breast)  Patient Location: PACU  Anesthesia Type:General  Level of Consciousness: awake, alert , and oriented  Airway & Oxygen Therapy: Patient Spontanous Breathing and Patient connected to face mask oxygen  Post-op Assessment: Report given to RN and Post -op Vital signs reviewed and stable  Post vital signs: Reviewed  Last Vitals:  Vitals Value Taken Time  BP    Temp    Pulse    Resp 16 12/31/21 1059  SpO2    Vitals shown include unvalidated device data.  Last Pain:  Vitals:   12/31/21 0702  TempSrc: Oral  PainSc: 0-No pain      Patients Stated Pain Goal: 3 (43/32/95 1884)  Complications: No notable events documented.

## 2021-12-31 NOTE — Op Note (Signed)
Breast Reduction Op note:    DATE OF PROCEDURE: 12/31/2021  LOCATION: [facility]  SURGEON: Lyndee Leo Sanger Ellarie Picking, DO  ASSISTANT: Roetta Sessions, PA  PREOPERATIVE DIAGNOSIS 1. Macromastia 2. Neck Pain 3. Back Pain  POSTOPERATIVE DIAGNOSIS 1. Macromastia 2. Neck Pain 3. Back Pain  PROCEDURES 1. Bilateral breast reduction.  Right reduction 880 g, Left reduction 034 g  COMPLICATIONS: None.  DRAINS: none  INDICATIONS FOR PROCEDURE Monica Benjamin is a 41 y.o. year-old female born on November 29, 1980,with a history of symptomatic macromastia with concominant back pain, neck pain, shoulder grooving from her bra.   MRN: [id number]  CONSENT Informed consent was obtained directly from the patient. The risks, benefits and alternatives were fully discussed. Specific risks including but not limited to bleeding, infection, hematoma, seroma, scarring, pain, nipple necrosis, asymmetry, poor cosmetic results, and need for further surgery were discussed. The patient had ample opportunity to have her questions answered to her satisfaction.  DESCRIPTION OF PROCEDURE  Patient was brought into the operating room and placed in a supine position.  SCDs were placed and appropriate padding was performed.  Antibiotics were given. The patient underwent general anesthesia and the chest was prepped and draped in a sterile fashion.  A timeout was performed and all information was confirmed to be correct. Tumescent was placed in the lateral breast area on each side. Liposuction was done at the lateral breast area.    Right side: Preoperative markings were confirmed.  Incision lines were injected with local with epinephrine.  After waiting for vasoconstriction, the marked lines were incised.  A Wise-pattern superomedial breast reduction was performed by de-epithelializing the pedicle, using bovie to create the superomedial pedicle, and removing breast tissue from the lateral and  inferior portions of the breast.  Care was taken to not undermine the breast pedicle. Hemostasis was achieved.  The nipple was gently rotated into position and the soft tissue closed with 4-0 Monocryl.   The pocket was irrigated and hemostasis confirmed.  The deep tissues were approximated with 3-0 PDS and 3-0 Monocryl sutures and the skin was closed with deep dermal and subcuticular 4-0 Monocryl sutures.  The nipple and skin flaps had good capillary refill at the end of the procedure.    Left side: Preoperative markings were confirmed.  Incision lines were injected with local with epinephrine.  After waiting for vasoconstriction, the marked lines were incised.  A Wise-pattern superomedial breast reduction was performed by de-epithelializing the pedicle, using bovie to create the superomedial pedicle, and removing breast tissue from the lateral and inferior portions of the breast.  Care was taken to not undermine the breast pedicle. Hemostasis was achieved.  The nipple was gently rotated into position and the soft tissue was closed with 4-0 Monocryl.  The patient was sat upright and size and shape symmetry was confirmed.  The pocket was irrigated and hemostasis confirmed.  The deep tissues were approximated with 3-0 PDS and 3-0 Monocryl sutures and the skin was closed with deep dermal and subcuticular 4-0 Monocryl sutures.  Dermabond was applied.  A breast binder and ABDs were placed.  The nipple and skin flaps had good capillary refill at the end of the procedure.  The patient tolerated the procedure well. The patient was allowed to wake from anesthesia and taken to the recovery room in satisfactory condition.  The advanced practice practitioner (APP) assisted throughout the case.  The APP was essential in retraction and counter traction when needed to make the case progress  smoothly.  This retraction and assistance made it possible to see the tissue plans for the procedure.  The assistance was needed for blood  control, tissue re-approximation and assisted with closure of the incision site.

## 2022-01-01 LAB — SURGICAL PATHOLOGY

## 2022-01-01 NOTE — Progress Notes (Signed)
Left message stating courtesy call and if any questions or concerns please call the doctors office.  

## 2022-01-02 ENCOUNTER — Encounter (HOSPITAL_BASED_OUTPATIENT_CLINIC_OR_DEPARTMENT_OTHER): Payer: Self-pay | Admitting: Plastic Surgery

## 2022-01-05 ENCOUNTER — Other Ambulatory Visit: Payer: Self-pay | Admitting: Family Medicine

## 2022-01-06 ENCOUNTER — Telehealth: Payer: Self-pay | Admitting: Plastic Surgery

## 2022-01-06 NOTE — Telephone Encounter (Signed)
LVM and my chart message that appt had been change to a different provider, PA, at 11:15 instead of 11:45 with Dr. Marla Roe due to scheduling conflicts.  Also asked pt to contact office if this time does not work for her to please contact our office to r/s appt.

## 2022-01-08 ENCOUNTER — Other Ambulatory Visit (HOSPITAL_COMMUNITY): Payer: Self-pay

## 2022-01-08 NOTE — Progress Notes (Signed)
Patient is a 41 year old female with PMH macromastia s/p bilateral breast reduction performed 12/31/2021 Dr. Marla Roe presents to clinic for postoperative follow-up. Reviewed operative report and nearly 900 g was removed from each side.  Today she reports overall she is doing well, has some tenderness to bilateral breast, mostly in the area of her lateral breast.  She is not having any infectious symptoms.  She has been wearing compression garments and the TopiFoam which has been helpful, she has some questions about dressings moving forward.  Chaperone present on exam On exam bilateral NAC's are viable, bilateral breast incisions appear intact.  She does have some ecchymosis in the periareolar area, however this is normal and as expected at this point.  She does have some swelling noted with palpation, difficult to determine if this is subcutaneous fluid collection or normal postoperative swelling.  No lower extremity swelling is noted.  A/P:  Continue with compressive garments, avoid strenuous activities or heavy lifting.  We discussed the swelling of bilateral breast is normal postoperatively, discussed it is possibly a fluid collection or some normal postoperative swelling, we elected to continue to watch for increased swelling.  Possibly need to drain a subcutaneous fluid collection at her next appointment pending any changes or concerns.  I do not see any signs of infection on exam.  We will plan to see her back in 1 week for reevaluation.  Recommend call with questions or concerns.

## 2022-01-09 ENCOUNTER — Ambulatory Visit (INDEPENDENT_AMBULATORY_CARE_PROVIDER_SITE_OTHER): Payer: 59 | Admitting: Surgical

## 2022-01-09 ENCOUNTER — Encounter: Payer: 59 | Admitting: Plastic Surgery

## 2022-01-09 VITALS — BP 149/88 | HR 74

## 2022-01-09 DIAGNOSIS — N62 Hypertrophy of breast: Secondary | ICD-10-CM

## 2022-01-09 DIAGNOSIS — G8929 Other chronic pain: Secondary | ICD-10-CM

## 2022-01-09 DIAGNOSIS — M542 Cervicalgia: Secondary | ICD-10-CM

## 2022-01-09 DIAGNOSIS — Z9189 Other specified personal risk factors, not elsewhere classified: Secondary | ICD-10-CM

## 2022-01-12 ENCOUNTER — Other Ambulatory Visit (HOSPITAL_COMMUNITY): Payer: Self-pay

## 2022-01-12 ENCOUNTER — Ambulatory Visit (INDEPENDENT_AMBULATORY_CARE_PROVIDER_SITE_OTHER): Payer: 59 | Admitting: Physician Assistant

## 2022-01-12 ENCOUNTER — Encounter: Payer: Self-pay | Admitting: Physician Assistant

## 2022-01-12 ENCOUNTER — Encounter: Payer: Self-pay | Admitting: Family Medicine

## 2022-01-12 ENCOUNTER — Ambulatory Visit: Payer: 59 | Admitting: Family Medicine

## 2022-01-12 VITALS — BP 123/88 | HR 80 | Temp 98.3°F

## 2022-01-12 VITALS — BP 116/80 | HR 72 | Temp 97.7°F | Ht 64.0 in | Wt 209.2 lb

## 2022-01-12 DIAGNOSIS — E781 Pure hyperglyceridemia: Secondary | ICD-10-CM

## 2022-01-12 DIAGNOSIS — E88819 Insulin resistance, unspecified: Secondary | ICD-10-CM | POA: Diagnosis not present

## 2022-01-12 DIAGNOSIS — Z9889 Other specified postprocedural states: Secondary | ICD-10-CM

## 2022-01-12 DIAGNOSIS — F411 Generalized anxiety disorder: Secondary | ICD-10-CM

## 2022-01-12 DIAGNOSIS — K219 Gastro-esophageal reflux disease without esophagitis: Secondary | ICD-10-CM

## 2022-01-12 DIAGNOSIS — F330 Major depressive disorder, recurrent, mild: Secondary | ICD-10-CM

## 2022-01-12 DIAGNOSIS — R7989 Other specified abnormal findings of blood chemistry: Secondary | ICD-10-CM

## 2022-01-12 DIAGNOSIS — K760 Fatty (change of) liver, not elsewhere classified: Secondary | ICD-10-CM | POA: Diagnosis not present

## 2022-01-12 DIAGNOSIS — J4540 Moderate persistent asthma, uncomplicated: Secondary | ICD-10-CM

## 2022-01-12 DIAGNOSIS — I10 Essential (primary) hypertension: Secondary | ICD-10-CM

## 2022-01-12 LAB — LIPID PANEL
Cholesterol: 245 mg/dL — ABNORMAL HIGH (ref 0–200)
HDL: 47.3 mg/dL
Total CHOL/HDL Ratio: 5
Triglycerides: 432 mg/dL — ABNORMAL HIGH (ref 0.0–149.0)

## 2022-01-12 LAB — COMPREHENSIVE METABOLIC PANEL
ALT: 18 U/L (ref 0–35)
AST: 17 U/L (ref 0–37)
Albumin: 4.4 g/dL (ref 3.5–5.2)
Alkaline Phosphatase: 91 U/L (ref 39–117)
BUN: 9 mg/dL (ref 6–23)
CO2: 30 mEq/L (ref 19–32)
Calcium: 10.2 mg/dL (ref 8.4–10.5)
Chloride: 97 mEq/L (ref 96–112)
Creatinine, Ser: 0.88 mg/dL (ref 0.40–1.20)
GFR: 80.57 mL/min (ref >60.00)
Glucose, Bld: 98 mg/dL (ref 70–99)
Potassium: 3.6 mEq/L (ref 3.5–5.1)
Sodium: 137 mEq/L (ref 135–145)
Total Bilirubin: 0.5 mg/dL (ref 0.2–1.2)
Total Protein: 7.4 g/dL (ref 6.0–8.3)

## 2022-01-12 LAB — LDL CHOLESTEROL, DIRECT: Direct LDL: 131 mg/dL

## 2022-01-12 LAB — HEMOGLOBIN A1C: Hgb A1c MFr Bld: 5.5 % (ref 4.6–6.5)

## 2022-01-12 MED ORDER — CLONAZEPAM 0.5 MG PO TABS
0.2500 mg | ORAL_TABLET | Freq: Every day | ORAL | 1 refills | Status: DC
  Filled 2022-01-12: qty 20, 20d supply, fill #0

## 2022-01-12 MED ORDER — HYDROCHLOROTHIAZIDE 25 MG PO TABS
25.0000 mg | ORAL_TABLET | Freq: Every day | ORAL | 3 refills | Status: DC
  Filled 2022-01-12: qty 90, 90d supply, fill #0
  Filled 2022-06-09: qty 90, 90d supply, fill #1
  Filled 2022-09-24: qty 90, 90d supply, fill #2
  Filled 2023-01-04: qty 90, 90d supply, fill #3

## 2022-01-12 MED ORDER — PANTOPRAZOLE SODIUM 20 MG PO TBEC
20.0000 mg | DELAYED_RELEASE_TABLET | Freq: Every day | ORAL | 3 refills | Status: DC
  Filled 2022-01-12: qty 90, 90d supply, fill #0
  Filled 2022-04-07: qty 90, 90d supply, fill #1
  Filled 2022-07-17: qty 90, 90d supply, fill #2
  Filled 2022-10-10: qty 90, 90d supply, fill #3

## 2022-01-12 MED ORDER — VORTIOXETINE HBR 10 MG PO TABS
10.0000 mg | ORAL_TABLET | Freq: Every day | ORAL | 3 refills | Status: DC
  Filled 2022-01-12: qty 90, 90d supply, fill #0
  Filled 2022-04-21: qty 90, 90d supply, fill #1
  Filled 2022-07-17 (×2): qty 90, 90d supply, fill #2
  Filled 2022-10-19: qty 90, 90d supply, fill #3

## 2022-01-12 NOTE — Patient Instructions (Signed)

## 2022-01-12 NOTE — Progress Notes (Signed)
Subjective  CC:  Chief Complaint  Patient presents with   Medication Refill    Pt wanting 3 meds refilled, protonix, hydrochlorothiazide and trintellix   Follow-up    Pt has no questions or concerns     HPI: Monica Benjamin is a 41 y.o. female who presents to the office today to address the problems listed above in the chief complaint. Hypertension f/u: Control is good . Pt reports she is doing well. taking medications as instructed, no medication side effects noted, no TIAs, no chest pain on exertion, no dyspnea on exertion, no swelling of ankles. On hctz 25 daily. She denies adverse effects from his BP medications. Compliance with medication is good.  S/p breast reduction. Reviewed surgeon visits/labs from 01/2021 Depression and anxiety remain well controlled on trintellix. Needs refills. No current exacerbations. No side effects. Uses klonopin prn for anxiety related sxs.  GERD remains well controlled on protonix Fatty liver. Weight is down from last year. No abd pain. Had mildly elevated lfts in January.  On fish oil and fasting today. H/o high trigs. H/o IR. No sxs of hyperglycemia.   Assessment  1. GAD (generalized anxiety disorder)   2. Mild episode of recurrent major depressive disorder ([place])   3. Essential hypertension   4. Gastroesophageal reflux disease without esophagitis   5. Elevated liver function tests   6. Hypertriglyceridemia   7. Moderate persistent asthma without complication   8. NAFLD (nonalcoholic fatty liver disease)   9. Insulin resistance      Plan   Hypertension f/u: BP control is well controlled. Recheck renal and lytes. Refilled hctz 25 daily Depression and anxiety are controlled. Refilled trintellix and klonopin Protonix is controlling gerd. B12 levels were ok in January Monitor lfts. Rec low fat diet Recheck lipids on fish oil bid Monitor A1c HM: is current  Education regarding management of these chronic disease states was given.  Management strategies discussed on successive visits include dietary and exercise recommendations, goals of achieving and maintaining IBW, and lifestyle modifications aiming for adequate sleep and minimizing stressors.   Follow up: 12 mo for cpe  Orders Placed This Encounter  Procedures   Hemoglobin A1c   Lipid panel   Comprehensive metabolic panel   Meds ordered this encounter  Medications   hydrochlorothiazide (HYDRODIURIL) 25 MG tablet    Sig: Take 1 tablet (25 mg total) by mouth daily.    Dispense:  90 tablet    Refill:  3   pantoprazole (PROTONIX) 20 MG tablet    Sig: Take 1 tablet (20 mg total) by mouth daily.    Dispense:  90 tablet    Refill:  3   vortioxetine HBr (TRINTELLIX) 10 MG TABS tablet    Sig: Take 1 tablet (10 mg total) by mouth daily.    Dispense:  90 tablet    Refill:  3   clonazePAM (KLONOPIN) 0.5 MG tablet    Sig: Take 0.5-1 tablets (0.25-0.5 mg total) by mouth daily as needed for anxiety    Dispense:  20 tablet    Refill:  1      BP Readings from Last 3 Encounters:  01/12/22 116/80  01/09/22 (!) 149/88  12/31/21 116/70   Wt Readings from Last 3 Encounters:  01/12/22 209 lb 3.2 oz (94.9 kg)  12/31/21 214 lb 11.7 oz (97.4 kg)  12/15/21 219 lb 12.8 oz (99.7 kg)    Lab Results  Component Value Date   CHOL 195 02/12/2021   CHOL  212 (H) 11/29/2019   CHOL 214 (H) 03/16/2018   Lab Results  Component Value Date   HDL 38 (L) 02/12/2021   HDL 43 (L) 11/29/2019   HDL 47.50 03/16/2018   Lab Results  Component Value Date   LDLCALC 121 (H) 02/12/2021   LDLCALC 121 (H) 11/29/2019   Lab Results  Component Value Date   TRIG 205 (H) 02/12/2021   TRIG 339 (H) 11/29/2019   TRIG 314.0 (H) 03/16/2018   Lab Results  Component Value Date   CHOLHDL 4.9 11/29/2019   CHOLHDL 5 03/16/2018   CHOLHDL 5 08/17/2017   Lab Results  Component Value Date   LDLDIRECT 137.0 03/16/2018   LDLDIRECT 117.0 08/17/2017   Lab Results  Component Value Date    CREATININE 0.77 12/25/2021   BUN 8 12/25/2021   NA 136 12/25/2021   K 3.5 12/25/2021   CL 101 12/25/2021   CO2 28 12/25/2021    The 10-year ASCVD risk score (Arnett DK, et al., 2019) is: 1.4%   Values used to calculate the score:     Age: 39 years     Sex: Female     Is Non-Hispanic African American: No     Diabetic: No     Tobacco smoker: No     Systolic Blood Pressure: 366 mmHg     Is BP treated: Yes     HDL Cholesterol: 38 mg/dL     Total Cholesterol: 195 mg/dL  I reviewed the patients updated PMH, FH, and SocHx.    Patient Active Problem List   Diagnosis Date Noted   Essential hypertension 03/16/2018    Priority: High   OSA on CPAP 05/25/2016    Priority: High   Moderate persistent asthma without complication 44/03/4740    Priority: High   NAFLD (nonalcoholic fatty liver disease) 02/26/2021    Priority: Medium    Mild episode of recurrent major depressive disorder ([place]) 05/10/2018    Priority: Medium    Hypertriglyceridemia 05/10/2018    Priority: Medium    Chronic seasonal allergic rhinitis due to pollen 11/25/2015    Priority: Medium    GAD (generalized anxiety disorder) 11/25/2015    Priority: Medium    Gastroesophageal reflux disease without esophagitis 11/25/2015    Priority: Medium    Irritable bowel syndrome with both constipation and diarrhea 11/25/2015    Priority: Medium    Vitamin D deficiency 02/26/2021    Priority: Low   Infertility, female 02/15/2017    Allergies: Ace inhibitors, Nsaids, and Prednisone  Social History: Patient  reports that she has never smoked. She has never used smokeless tobacco. She reports that she does not currently use alcohol. She reports that she does not use drugs.  Current Meds  Medication Sig   albuterol (VENTOLIN HFA) 108 (90 Base) MCG/ACT inhaler Inhale 2 puffs into the lungs every 4 (four) hours as needed (cough, shortness of breath or wheezing.).   budesonide-formoterol (SYMBICORT) 80-4.5 MCG/ACT inhaler  Inhale 2 puffs into the lungs 2 (two) times daily.   cetirizine (ZYRTEC) 10 MG tablet Take 10 mg by mouth daily.   fluticasone (FLONASE) 50 MCG/ACT nasal spray Place 2 sprays into both nostrils daily.   Omega-3 Fatty Acids (FISH OIL) 1000 MG CAPS Take 1 capsule by mouth daily.   [DISCONTINUED] clonazePAM (KLONOPIN) 0.5 MG tablet Take 0.5-1 tablets (0.25-0.5 mg total) by mouth daily as needed for anxiety   [DISCONTINUED] hydrochlorothiazide (HYDRODIURIL) 25 MG tablet Take 1 tablet (25 mg total) by mouth  daily.   [DISCONTINUED] pantoprazole (PROTONIX) 20 MG tablet Take 1 tablet (20 mg total) by mouth daily.   [DISCONTINUED] vortioxetine HBr (TRINTELLIX) 10 MG TABS tablet Take 1 tablet (10 mg total) by mouth daily.    Review of Systems: Cardiovascular: negative for chest pain, palpitations, leg swelling, orthopnea Respiratory: negative for SOB, wheezing or persistent cough Gastrointestinal: negative for abdominal pain Genitourinary: negative for dysuria or gross hematuria  Objective  Vitals: BP 116/80 (BP Location: Left Arm, Patient Position: Sitting)   Pulse 72   Temp 97.7 F (36.5 C) (Temporal)   Ht '5\' 4"'$  (1.626 m)   Wt 209 lb 3.2 oz (94.9 kg)   LMP 01/09/2022 Comment: Urine pregnancy test negative  SpO2 97%   BMI 35.91 kg/m  General: no acute distress  Psych:  Alert and oriented, normal mood and affect HEENT:  Normocephalic, atraumatic, supple neck  Cardiovascular:  RRR without murmur. no edema Respiratory:  Good breath sounds bilaterally, CTAB with normal respiratory effort Skin:  Warm, no rashes Neurologic:   Mental status is normal Commons side effects, risks, benefits, and alternatives for medications and treatment plan prescribed today were discussed, and the patient expressed understanding of the given instructions. Patient is instructed to call or message via MyChart if he/she has any questions or concerns regarding our treatment plan. No barriers to understanding were  identified. We discussed Red Flag symptoms and signs in detail. Patient expressed understanding regarding what to do in case of urgent or emergency type symptoms.  Medication list was reconciled, printed and provided to the patient in AVS. Patient instructions and summary information was reviewed with the patient as documented in the AVS. This note was prepared with assistance of Dragon voice recognition software. Occasional wrong-word or sound-a-like substitutions may have occurred due to the inherent limitation

## 2022-01-12 NOTE — Progress Notes (Signed)
Patient is a pleasant 41 year old female with PMH of macromastia s/p bilateral breast reduction with lateral liposuction performed 12/31/2021 by Dr. Marla Roe presents to clinic for postoperative follow-up.  She was seen here in clinic for initial postoperative encounter 01/09/2022.  At that time, she had bilateral breast tenderness, predominantly in the lateral aspects.  She had been continue with compressive garments and telephone which have been helpful.  Ecchymoses noted in the periareolar area, but no other significant bruising.  Some swelling was noted with palpation, but unclear whether seroma versus normal postoperative swelling.  Plan is for continued compressive garments and activity modifications.  Discussed possibility of draining subcutaneous fluid at subsequent encounter if she had any changes or concerns.  Plan was for her to return in 1 week for follow-up.  Today, patient presents because she is concerned given that she is having some tenderness bilaterally, predominantly towards the axilla.  She also feels that the right side is larger than the left.  Concern for fluid collection.  Denies any fevers, redness, leg swelling, or other symptoms.  On exam, her right breast does appear to be larger than the left and slightly more ptotic.  Reviewed preoperative photos and it appears as though that was her larger side preoperatively, as well.  Do not feel any ballotable fluid collections or other subcutaneous fluid collections otherwise concerning for seroma or hematoma.  NAC's are viable.  Incisions all healing well.  Steri-Strips removed without complication or difficulty.  Attempted aspiration did not yield any output.  No significant ecchymoses.  Suspect that her bilateral tenderness, but only towards axilla, is reflective of her liposuction that was performed.  This is not uncommon postoperatively.  Discussed NSAIDs, but she states that she has an allergy.  Will continue with Tylenol scheduled.   She states that the pain has actually gotten better since last week and suspect it will continue to improve.  Feel as though the right side is simply larger than the left, do not feel as though it is representative of fluid collection.  Encouraged her to continue to give it time to see if it would improve as the swelling improves.  Continue with compressive garments and activity modifications.  Picture(s) obtained of the patient and placed in the chart were with the patient's or guardian's permission.

## 2022-01-15 ENCOUNTER — Encounter: Payer: 59 | Admitting: Student

## 2022-01-15 ENCOUNTER — Other Ambulatory Visit (HOSPITAL_COMMUNITY): Payer: Self-pay

## 2022-01-15 ENCOUNTER — Ambulatory Visit (INDEPENDENT_AMBULATORY_CARE_PROVIDER_SITE_OTHER): Payer: 59 | Admitting: Physician Assistant

## 2022-01-15 VITALS — BP 127/82 | HR 66 | Temp 97.9°F

## 2022-01-15 DIAGNOSIS — Z9889 Other specified postprocedural states: Secondary | ICD-10-CM

## 2022-01-15 MED ORDER — FENOFIBRATE 145 MG PO TABS
145.0000 mg | ORAL_TABLET | Freq: Every day | ORAL | 3 refills | Status: DC
  Filled 2022-01-15: qty 90, 90d supply, fill #0
  Filled 2022-04-21: qty 90, 90d supply, fill #1
  Filled 2022-07-17 (×2): qty 90, 90d supply, fill #2

## 2022-01-15 MED ORDER — CLINDAMYCIN HCL 150 MG PO CAPS: 450.0000 mg | ORAL_CAPSULE | Freq: Three times a day (TID) | ORAL | 0 refills | Status: AC

## 2022-01-15 NOTE — Progress Notes (Signed)
Patient is a pleasant 41 year old female with PMH of macromastia s/p bilateral breast reduction with lateral liposuction performed 12/31/2021 by Dr. Marla Roe presents to clinic for postoperative follow-up.   She was last seen here in clinic on 01/12/2022.  At that time, she was concerned because she is having some tenderness bilaterally, predominantly towards the axilla.  She also felt that the right side was larger than the left.  Suspected that the axillary region discomfort was likely related to the liposuction that had been performed.  Her right side did appear to be slightly larger and more ptotic than the left.  However, consistent with how she was preoperatively.  No obvious ballotable fluid collections or subcutaneous fluid collections noted on exam that were otherwise concerning for seroma or hematoma.  Given the asymmetry and patient's reported discomfort, attempted aspiration, but did not yield any output.  Today, she states that she is having some worsening pain on the medial aspect of inframammary incision on left breast and was concern for infection.  She states that there was a mildly foul odor and yellowish drainage.  She states the right breast swelling and discomfort has improved considerably and she denies any complaints on that side.  She also denies any fevers and states that she is otherwise recovering well.  On exam, the right breast is still slightly larger and more ptotic than the left, but improved symmetry compared to previous encounter.  Incisions all CDI on that side.  NAC viable and healing nicely.  On the left, NAC is healthy.  She does have a 2 cm area of incisional slough on medial aspect of inframammary incision.  The skin edges are mildly erythematous and irritated, but no spreading erythema or swelling.  The area is mildly tender to palpation.  No other areas of concern.  Patient with a small, 2 cm incisional wound on medial aspect left inframammary incision with some  surrounding erythema likely representative of irritation.  The foul odor that she described is likely due to the incisional slough which is causing the yellowish drainage on her bandages.  Explained to patient that this is relatively nonconcerning, but that the drainage may continue until the slough as all liquefied.  At that time, she will likely have a small wound that will then likely heal without complication or difficulty.  Advised applying thin film of Vaseline.  While the area is not particularly concerning for infection and patient is otherwise doing well, given that we are heading into the holiday weekend, discussed prescribing clindamycin x 5 days.  She will not take the antibiotics unless she feels as though the area is becoming more erythematous or tender to palpation.  She understands that this is likely a small incisional wound that will heal.  Overall, she is pleased.  Patient already has scheduled postop follow-up for next week.  Picture(s) obtained of the patient and placed in the chart were with the patient's or guardian's permission.

## 2022-01-15 NOTE — Addendum Note (Signed)
Addended by: Billey Chang on: 01/15/2022 04:25 PM   Modules accepted: Orders

## 2022-01-22 ENCOUNTER — Ambulatory Visit (INDEPENDENT_AMBULATORY_CARE_PROVIDER_SITE_OTHER): Payer: 59 | Admitting: Student

## 2022-01-22 DIAGNOSIS — Z9889 Other specified postprocedural states: Secondary | ICD-10-CM

## 2022-01-22 NOTE — Progress Notes (Signed)
Patient is a 41 year old female who underwent bilateral breast reduction with lateral liposuction by Dr. Marla Roe on 12/31/2021.  She is 3 weeks postop.  Patient presents to the clinic for postoperative follow-up.  Patient was last seen in the clinic on 01/15/2022.  At this visit, she reported she had some worsening pain to the medial aspect of the inframammary incision on the left breast and was concerned for infection.  She reported that there is a mildly foul odor and yellowish drainage.  On exam, the right breast was slightly larger and more ptotic than the left, but improved symmetry compared to the previous encounter.  The incisions were clean dry and intact on the right side.  NAC was viable and healing nicely.  On the left breast, the NAC appear to be healthy.  There is a 2 cm area of incisional slough on the medial aspect of the inframammary incision to the left.  There is no spreading erythema or swelling.  It was mildly tender to palpation.  It was discussed with the patient that the area to the left inframammary incision appeared to be more irritation rather than infection.  Plan is for patient to apply Vaseline to the area daily.  Clindamycin was prescribed for the patient to take over the holiday weekend if she felt the area was worsening.  Today, patient reports she is doing well.  She states that she did not need to take the clindamycin and feels the area that she was concerned about before has been improving.  She denies any significant drainage from the area.  She states she has been putting Vaseline on the area and on her inframammary incisions.  She denies any fevers or chills.   Chaperone present on exam.  On exam, patient is sitting upright in no acute distress.  Breasts are soft and fairly symmetric bilaterally.  There is a little bit of swelling noted, a little more so to the right breast than the left.  There may be a little bit of fluid noted to the right breast, but no significant  fluid collection palpated.  There is no erythema or ecchymosis overlying either breast.  NAC's are viable bilaterally.  There is a small approximately 2 cm area of slough to the left inframammary incision.  There is some yellowish exudate noted.  There is a little bit of irritation noted.  There is no active drainage noted on exam.  It does not appear infected on exam.  Incisions are otherwise clean dry and intact. there were several suture knots noted, these were cut and removed.  Patient tolerated well.  There is also a little bit of firmness noted near the incision of the right vertical limb incision of the right breast, consistent with scarring.  I discussed with the patient that we could attempt aspiration to her right breast to see if we could aspirate any fluid.  She states that this was attempted at her last visit and no fluid was collected.  I discussed with the patient the risks and benefits of aspiration.  Decision was made with the patient to manage conservatively with compression and to monitor the area.  Patient was in agreement with this decision.  I discussed with the patient that she will need to wear compression at all times.  I discussed with the patient that she should apply Vaseline daily to all of her incisions.  I also discussed with the patient that she should gently massage the area to her right breast that  is firm that is consistent with scarring.  I discussed with the patient that she should continue to monitor the surgical sites.  I discussed with her that if the surgical site becomes red, if she develops any fevers or chills, if the surgical site becomes painful or if she develops any new drainage, she should let us know.  Patient expressed understanding.  Patient to follow-up later next week.  I instructed the patient to call the meantime if she has any questions or concerns.  Pictures were obtained of the patient and placed in the chart with the patient's or guardian's  permission.

## 2022-01-25 DIAGNOSIS — Z760 Encounter for issue of repeat prescription: Secondary | ICD-10-CM | POA: Diagnosis not present

## 2022-01-30 ENCOUNTER — Ambulatory Visit (INDEPENDENT_AMBULATORY_CARE_PROVIDER_SITE_OTHER): Payer: 59 | Admitting: Student

## 2022-01-30 ENCOUNTER — Encounter: Payer: Self-pay | Admitting: Student

## 2022-01-30 VITALS — BP 130/90 | HR 72

## 2022-01-30 DIAGNOSIS — Z9889 Other specified postprocedural states: Secondary | ICD-10-CM

## 2022-01-30 NOTE — Progress Notes (Signed)
Patient is a 42 year old female who underwent bilateral breast reduction with lateral liposuction by Dr. Marla Roe on 12/31/2021.  She is 4 weeks postop.  Patient presents to the clinic for postoperative follow-up.  Patient was last seen in the clinic on 01/22/2022.  At this visit, she reported she is doing well.  On exam, there was a little bit of swelling noted a little more to the right breast on the left breast.  There is no erythema or ecchymosis over either breast.  NAC's are viable bilaterally.  There is a 2 cm area of slough to the left inframammary incision.  Plan is for patient to wear compression at all times and apply Vaseline daily to her incisions.  Today, patient reports she is doing well.  She states that she has been applying Vaseline daily to her incisions.  She states that she feels the right breast is approximately the same size.  She denies any other changes.  She denies any other issues or concerns.  Chaperone present on exam.  On exam, patient is sitting upright in no acute distress.  Breasts are soft bilaterally.  Right breast is slightly bigger than the left breast.  There may be a small amount of fluid to the right side, but there are no large or significant fluid collections palpated on exam.  NAC's are viable bilaterally.  There is an approximately 1 cm area of slough to the left inframammary incision.  Otherwise incisions are intact and healing well.  There were several suture knots that were protruding from the skin.  These were cut and removed.  Patient tolerated well.  I discussed with the patient the possibility of attempting aspiration.  I discussed the risks and benefits of aspiration versus conservative management with compression.  Patient opted to continue to wait and see if it improves with compression.   I discussed with the patient to continue compression at all times and to monitor the area.  I discussed with the patient to call us back if the area becomes bigger,  red, painful or develops any fevers or chills.  Patient expressed understanding.  I discussed with the patient to continue to apply Vaseline daily to her incisions.  Patient to follow-up in 2 weeks.  Instructed the patient to call in the meantime if she has any questions or concerns.

## 2022-02-05 ENCOUNTER — Encounter: Payer: 59 | Admitting: Student

## 2022-02-12 ENCOUNTER — Encounter: Payer: Self-pay | Admitting: Student

## 2022-02-12 ENCOUNTER — Ambulatory Visit (INDEPENDENT_AMBULATORY_CARE_PROVIDER_SITE_OTHER): Payer: 59 | Admitting: Student

## 2022-02-12 VITALS — BP 130/87 | HR 71

## 2022-02-12 DIAGNOSIS — Z9889 Other specified postprocedural states: Secondary | ICD-10-CM

## 2022-02-12 NOTE — Progress Notes (Signed)
Patient is a 42 year old female who underwent bilateral breast reduction with lateral liposuction with Dr. Marla Roe on 12/31/2021.  She is 6 weeks postop.  Patient presents to the clinic for postoperative follow-up.  Patient was last seen in the clinic on 01/30/2022.  At this visit, she reported she was doing well.  On exam, breasts were soft bilaterally.  Right breast is slightly bigger than the left breast.  NAC's were viable bilaterally.  There is an approximately 1 cm area of slough to the inframammary incision.  Incisions were other wise intact and healing well.  Several suture knots were also protruding from the skin which were cut and removed.  Possibility of aspiration was discussed with the patient.  Plan was to continue with compression and monitor the swelling.  Patient was to continue Vaseline daily to her incisions.  Plan is for patient to follow-up in 2 weeks.  Today, patient she is doing well.  She states that she feels the swelling has gone down a little bit to her right breast.  She denies any new issues or concerns.  She states that she has been applying Vaseline to her incisions daily.  Chaperone present on exam.  On exam, patient is sitting upright in no acute distress.  Breasts are soft bilaterally.  There is a small area of firmness to the medial aspect of the breast bilaterally that is consistent with some scarring versus fat necrosis.  There is no overlying skin changes to either breast.  There is no overlying erythema or ecchymosis.  NAC's are viable bilaterally.  Right breast is slightly larger than the left breast.  There are no significant fluid collections palpated on exam.  There is a small area of irritation to the superior aspect of the right NAC.  Incisions are otherwise intact and healing well.  There are no signs of infection on exam.  I discussed with the patient that she should apply a small amount of Vaseline daily to the area of irritation to the superior aspect of the  right NAC.  I discussed with the patient that she can start using scar creams on all other parts of her incision.  I discussed the use of scar cream such as Mederma, Silagen and Skinuva.  Patient expressed understanding.  I discussed with the patient that she should gently massage the medial aspects of her breast bilaterally.  I discussed with the patient that she can start gradually increasing her activities and transition into a regular bra without underwire.  I discussed with the patient that if she does notice any swelling, I would recommend her to wear compression again.  Patient expressed understanding.  Patient to follow-up in 1 month for reevaluation.  I instructed the patient to call in the meantime if she has any questions or concerns.  Pictures were obtained of the patient and placed in the chart with the patient's or guardian's permission.

## 2022-03-13 ENCOUNTER — Telehealth: Payer: Self-pay | Admitting: Plastic Surgery

## 2022-03-13 NOTE — Telephone Encounter (Signed)
Received voicemail that pt needed to r/s appt on Monday 2/19 and returned her call.  Canceled appt on 2/19 and lvm for pt to call back to r/s.

## 2022-03-16 ENCOUNTER — Encounter: Payer: 59 | Admitting: Student

## 2022-03-18 NOTE — Progress Notes (Unsigned)
Patient is a 42 year old female who underwent bilateral breast reduction with lateral liposuction with Dr. Marla Roe on 12/31/2021.  She presents to the clinic for follow-up.  Patient was last seen in the clinic on 02/12/2022.  At this visit, she was doing well.  She reported that the swelling had gone down a little bit to her right breast.  She denied any other issues or concerns.  She stated that she was applying Vaseline to her incisions daily.  On exam, breasts were soft bilaterally.  There is a small area of firmness to the medial aspect that was consistent with some scarring versus fat necrosis.  There is no overlying skin changes to either breast.  There is no overlying erythema or ecchymosis.  NAC's are viable bilaterally.  Right breast was slightly larger than the left breast.  There were no fluid collections palpated on exam.  There is a small area of irritation noted to the superior aspect of the right NAC.  Plan was for patient to apply small amount of Vaseline daily to the right NAC.  It was discussed with the patient that she could start using scar cream on all other parts of her incision.  Patient was also to gently massage the medial aspects of her breast bilaterally.  Patient to return in 1 month for reevaluation.  Today,

## 2022-03-19 ENCOUNTER — Ambulatory Visit (INDEPENDENT_AMBULATORY_CARE_PROVIDER_SITE_OTHER): Payer: 59 | Admitting: Student

## 2022-03-19 DIAGNOSIS — Z9889 Other specified postprocedural states: Secondary | ICD-10-CM

## 2022-03-27 ENCOUNTER — Other Ambulatory Visit (HOSPITAL_COMMUNITY): Payer: Self-pay

## 2022-03-27 ENCOUNTER — Telehealth: Payer: 59 | Admitting: Physician Assistant

## 2022-03-27 DIAGNOSIS — B9689 Other specified bacterial agents as the cause of diseases classified elsewhere: Secondary | ICD-10-CM

## 2022-03-27 DIAGNOSIS — J019 Acute sinusitis, unspecified: Secondary | ICD-10-CM | POA: Diagnosis not present

## 2022-03-27 MED ORDER — AMOXICILLIN-POT CLAVULANATE 875-125 MG PO TABS
1.0000 | ORAL_TABLET | Freq: Two times a day (BID) | ORAL | 0 refills | Status: DC
  Filled 2022-03-27: qty 14, 7d supply, fill #0

## 2022-03-27 NOTE — Progress Notes (Signed)

## 2022-04-21 ENCOUNTER — Other Ambulatory Visit (HOSPITAL_COMMUNITY): Payer: Self-pay

## 2022-04-23 ENCOUNTER — Other Ambulatory Visit (HOSPITAL_COMMUNITY): Payer: Self-pay

## 2022-04-28 NOTE — Progress Notes (Deleted)
42 y.o. G34P0000 Married Caucasian female here for annual exam.    PCP:     No LMP recorded.           Sexually active: {yes no:314532}  The current method of family planning is female partner.    Exercising: {yes no:314532}  {types:19826} Smoker:  no  Health Maintenance: Pap:  04/01/20 neg: HR HPV neg, 10/17/18 neg History of abnormal Pap:  no MMG:  02/21/21 Breast Density Category C, BI-RADS CAT 2 benign Colonoscopy:  n/a BMD:   n/a  Result  n/a TDaP:  10/31/13 Gardasil:   yes HIV: 05/24/15 NR Hep C: n/a Screening Labs:  Hb today: ***, Urine today: ***   reports that she has never smoked. She has never used smokeless tobacco. She reports that she does not currently use alcohol. She reports that she does not use drugs.  Past Medical History:  Diagnosis Date   Allergy    Anxiety    Asthma    Back pain    Depression    GERD (gastroesophageal reflux disease)    Hyperlipidemia    Hypertension    IBS (irritable bowel syndrome)    Infertility, female 02/15/2017   IVF treatment   Lumbar herniated disc    L4-L5   Migraines    Other fatigue    Shortness of breath on exertion    Sleep apnea    Trigeminal neuralgia    Vitamin D deficiency 01/26/2021    Past Surgical History:  Procedure Laterality Date   BREAST REDUCTION SURGERY Bilateral 12/31/2021   Procedure: BREAST REDUCTION WITH LIPOSUCTION;  Surgeon: Wallace Going, DO;  Location: [facility];  Service: Plastics;  Laterality: Bilateral;   WISDOM TOOTH EXTRACTION      Current Outpatient Medications  Medication Sig Dispense Refill   albuterol (VENTOLIN HFA) 108 (90 Base) MCG/ACT inhaler Inhale 2 puffs into the lungs every 4 (four) hours as needed (cough, shortness of breath or wheezing.). 18 g 1   amoxicillin-clavulanate (AUGMENTIN) 875-125 MG tablet Take 1 tablet by mouth 2 (two) times daily. 14 tablet 0   budesonide-formoterol (SYMBICORT) 80-4.5 MCG/ACT inhaler Inhale 2 puffs into the lungs 2 (two)  times daily. 10.2 g 3   cetirizine (ZYRTEC) 10 MG tablet Take 10 mg by mouth daily.     clonazePAM (KLONOPIN) 0.5 MG tablet Take 1/2-1 tablet (0.25-0.5 mg total) by mouth daily as needed for anxiety 20 tablet 1   fenofibrate (TRICOR) 145 MG tablet Take 1 tablet (145 mg total) by mouth at bedtime. 90 tablet 3   fluticasone (FLONASE) 50 MCG/ACT nasal spray Place 2 sprays into both nostrils daily. 16 g 0   hydrochlorothiazide (HYDRODIURIL) 25 MG tablet Take 1 tablet (25 mg total) by mouth daily. 90 tablet 3   Omega-3 Fatty Acids (FISH OIL) 1000 MG CAPS Take 1 capsule by mouth daily.     pantoprazole (PROTONIX) 20 MG tablet Take 1 tablet (20 mg total) by mouth daily. 90 tablet 3   vortioxetine HBr (TRINTELLIX) 10 MG TABS tablet Take 1 tablet (10 mg total) by mouth daily. 90 tablet 3   No current facility-administered medications for this visit.    Family History  Problem Relation Age of Onset   Anxiety disorder Mother    Osteoporosis Mother    Migraines Mother    Alcoholism Father    Hyperlipidemia Father    Hypertension Father    Migraines Sister    Dementia Maternal Grandmother    Diabetes Maternal  Grandfather    COPD Paternal Grandmother    Anxiety disorder Other    Alcoholism Other    Cancer Neg Hx    Heart disease Neg Hx     Review of Systems  Exam:   There were no vitals taken for this visit.    General appearance: alert, cooperative and appears stated age Head: normocephalic, without obvious abnormality, atraumatic Neck: no adenopathy, supple, symmetrical, trachea midline and thyroid normal to inspection and palpation Lungs: clear to auscultation bilaterally Breasts: normal appearance, no masses or tenderness, No nipple retraction or dimpling, No nipple discharge or bleeding, No axillary adenopathy Heart: regular rate and rhythm Abdomen: soft, non-tender; no masses, no organomegaly Extremities: extremities normal, atraumatic, no cyanosis or edema Skin: skin color,  texture, turgor normal. No rashes or lesions Lymph nodes: cervical, supraclavicular, and axillary nodes normal. Neurologic: grossly normal  Pelvic: External genitalia:  no lesions              No abnormal inguinal nodes palpated.              Urethra:  normal appearing urethra with no masses, tenderness or lesions              Bartholins and Skenes: normal                 Vagina: normal appearing vagina with normal color and discharge, no lesions              Cervix: no lesions              Pap taken: {yes no:314532} Bimanual Exam:  Uterus:  normal size, contour, position, consistency, mobility, non-tender              Adnexa: no mass, fullness, tenderness              Rectal exam: {yes no:314532}.  Confirms.              Anus:  normal sphincter tone, no lesions  Chaperone was present for exam:  ***  Assessment:   Well woman visit with gynecologic exam.   Plan: Mammogram screening discussed. Self breast awareness reviewed. Pap and HR HPV as above. Guidelines for Calcium, Vitamin D, regular exercise program including cardiovascular and weight bearing exercise.   Follow up annually and prn.   Additional counseling given.  {yes [id number]. _______ minutes face to face time of which over 50% was spent in counseling.    After visit summary provided.

## 2022-05-12 ENCOUNTER — Ambulatory Visit: Payer: Commercial Managed Care - PPO | Admitting: Obstetrics and Gynecology

## 2022-07-17 ENCOUNTER — Other Ambulatory Visit (HOSPITAL_BASED_OUTPATIENT_CLINIC_OR_DEPARTMENT_OTHER): Payer: Self-pay

## 2022-07-17 ENCOUNTER — Other Ambulatory Visit: Payer: Self-pay

## 2022-07-17 ENCOUNTER — Other Ambulatory Visit (HOSPITAL_COMMUNITY): Payer: Self-pay

## 2022-07-21 ENCOUNTER — Encounter: Payer: Self-pay | Admitting: Obstetrics and Gynecology

## 2022-07-21 NOTE — Progress Notes (Deleted)
42 y.o. G55P0000 Married Caucasian female here for annual exam.    PCP: Asencion Partridge, MD?     No LMP recorded.           Sexually active: {yes no:314532}  The current method of family planning is female partner.    Exercising: {yes no:314532}  {types:19826} Smoker: no  Health Maintenance: Pap: 04/01/2020-WNL, HPV-neg, 10/17/2018-WNL, 05/27/2015-WNL, HRHPV- neg History of abnormal Pap: no MMG: 02/21/2021-birads 2 benign, Cat C? Colonoscopy: 06/17/2018 w/ Dr. Loreta Ave? BMD: never TDaP: 10/31/2013 Gardasil: yes, completed. HIV: 05/24/2015-NR Hep C: never Screening Labs:  Hb today: ***, Urine today: ***   reports that she has never smoked. She has never used smokeless tobacco. She reports that she does not currently use alcohol. She reports that she does not use drugs.  Past Medical History:  Diagnosis Date   Allergy    Anxiety    Asthma    Back pain    Depression    GERD (gastroesophageal reflux disease)    Hyperlipidemia    Hypertension    IBS (irritable bowel syndrome)    Infertility, female 02/15/2017   IVF treatment   Lumbar herniated disc    L4-L5   Migraines    Other fatigue    Shortness of breath on exertion    Sleep apnea    Trigeminal neuralgia    Vitamin D deficiency 01/26/2021    Past Surgical History:  Procedure Laterality Date   BREAST REDUCTION SURGERY Bilateral 12/31/2021   Procedure: BREAST REDUCTION WITH LIPOSUCTION;  Surgeon: Peggye Form, DO;  Location: [facility];  Service: Plastics;  Laterality: Bilateral;   WISDOM TOOTH EXTRACTION      Current Outpatient Medications  Medication Sig Dispense Refill   albuterol (VENTOLIN HFA) 108 (90 Base) MCG/ACT inhaler Inhale 2 puffs into the lungs every 4 (four) hours as needed (cough, shortness of breath or wheezing.). 18 g 1   amoxicillin-clavulanate (AUGMENTIN) 875-125 MG tablet Take 1 tablet by mouth 2 (two) times daily. 14 tablet 0   budesonide-formoterol (SYMBICORT) 80-4.5 MCG/ACT inhaler  Inhale 2 puffs into the lungs 2 (two) times daily. 10.2 g 3   cetirizine (ZYRTEC) 10 MG tablet Take 10 mg by mouth daily.     clonazePAM (KLONOPIN) 0.5 MG tablet Take 1/2-1 tablet (0.25-0.5 mg total) by mouth daily as needed for anxiety 20 tablet 1   fenofibrate (TRICOR) 145 MG tablet Take 1 tablet (145 mg total) by mouth at bedtime. 90 tablet 3   fluticasone (FLONASE) 50 MCG/ACT nasal spray Place 2 sprays into both nostrils daily. 16 g 0   hydrochlorothiazide (HYDRODIURIL) 25 MG tablet Take 1 tablet (25 mg total) by mouth daily. 90 tablet 3   Omega-3 Fatty Acids (FISH OIL) 1000 MG CAPS Take 1 capsule by mouth daily.     pantoprazole (PROTONIX) 20 MG tablet Take 1 tablet (20 mg total) by mouth daily. 90 tablet 3   vortioxetine HBr (TRINTELLIX) 10 MG TABS tablet Take 1 tablet (10 mg total) by mouth daily. 90 tablet 3   No current facility-administered medications for this visit.    Family History  Problem Relation Age of Onset   Anxiety disorder Mother    Osteoporosis Mother    Migraines Mother    Alcoholism Father    Hyperlipidemia Father    Hypertension Father    Migraines Sister    Dementia Maternal Grandmother    Diabetes Maternal Grandfather    COPD Paternal Grandmother    Anxiety disorder Other  Alcoholism Other    Cancer Neg Hx    Heart disease Neg Hx     Review of Systems  Exam:   There were no vitals taken for this visit.    General appearance: alert, cooperative and appears stated age Head: normocephalic, without obvious abnormality, atraumatic Neck: no adenopathy, supple, symmetrical, trachea midline and thyroid normal to inspection and palpation Lungs: clear to auscultation bilaterally Breasts: normal appearance, no masses or tenderness, No nipple retraction or dimpling, No nipple discharge or bleeding, No axillary adenopathy Heart: regular rate and rhythm Abdomen: soft, non-tender; no masses, no organomegaly Extremities: extremities normal, atraumatic, no  cyanosis or edema Skin: skin color, texture, turgor normal. No rashes or lesions Lymph nodes: cervical, supraclavicular, and axillary nodes normal. Neurologic: grossly normal  Pelvic: External genitalia:  no lesions              No abnormal inguinal nodes palpated.              Urethra:  normal appearing urethra with no masses, tenderness or lesions              Bartholins and Skenes: normal                 Vagina: normal appearing vagina with normal color and discharge, no lesions              Cervix: no lesions              Pap taken: {yes no:314532} Bimanual Exam:  Uterus:  normal size, contour, position, consistency, mobility, non-tender              Adnexa: no mass, fullness, tenderness              Rectal exam: {yes no:314532}.  Confirms.              Anus:  normal sphincter tone, no lesions  Chaperone was present for exam:  ***  Assessment:   Well woman visit with gynecologic exam.   Plan: Mammogram screening discussed. Self breast awareness reviewed. Pap and HR HPV as above. Guidelines for Calcium, Vitamin D, regular exercise program including cardiovascular and weight bearing exercise.   Follow up annually and prn.   Additional counseling given.  {yes [id number]. _______ minutes face to face time of which over 50% was spent in counseling.    After visit summary provided.

## 2022-07-27 ENCOUNTER — Encounter: Payer: Self-pay | Admitting: Family Medicine

## 2022-07-27 ENCOUNTER — Ambulatory Visit: Payer: 59 | Admitting: Family Medicine

## 2022-07-27 ENCOUNTER — Ambulatory Visit (HOSPITAL_COMMUNITY)
Admission: RE | Admit: 2022-07-27 | Discharge: 2022-07-27 | Disposition: A | Discharge status: Home / Self Care | Payer: 59 | Source: Ambulatory Visit | Attending: Family Medicine | Admitting: Family Medicine

## 2022-07-27 ENCOUNTER — Ambulatory Visit: Payer: Commercial Managed Care - PPO | Admitting: Obstetrics and Gynecology

## 2022-07-27 VITALS — BP 116/82 | HR 84 | Temp 98.7°F | Wt 221.2 lb

## 2022-07-27 DIAGNOSIS — R1011 Right upper quadrant pain: Secondary | ICD-10-CM | POA: Diagnosis not present

## 2022-07-27 DIAGNOSIS — R509 Fever, unspecified: Secondary | ICD-10-CM | POA: Insufficient documentation

## 2022-07-27 LAB — CBC WITH DIFFERENTIAL/PLATELET
Basophils Absolute: 0 10*3/uL (ref 0.0–0.1)
Basophils Relative: 0.7 % (ref 0.0–3.0)
Eosinophils Absolute: 0.3 10*3/uL (ref 0.0–0.7)
Eosinophils Relative: 3.8 % (ref 0.0–5.0)
HCT: 38.5 % (ref 36.0–46.0)
Hemoglobin: 13 g/dL (ref 12.0–15.0)
Lymphocytes Relative: 19.2 % (ref 12.0–46.0)
Lymphs Abs: 1.3 10*3/uL (ref 0.7–4.0)
MCHC: 33.8 g/dL (ref 30.0–36.0)
MCV: 88.2 fl (ref 78.0–100.0)
Monocytes Absolute: 0.7 10*3/uL (ref 0.1–1.0)
Monocytes Relative: 10.2 % (ref 3.0–12.0)
Neutro Abs: 4.6 10*3/uL (ref 1.4–7.7)
Neutrophils Relative %: 66.1 % (ref 43.0–77.0)
Platelets: 262 10*3/uL (ref 150.0–400.0)
RBC: 4.37 Mil/uL (ref 3.87–5.11)
RDW: 13.7 % (ref 11.5–15.5)
WBC: 6.9 10*3/uL (ref 4.0–10.5)

## 2022-07-27 LAB — COMPREHENSIVE METABOLIC PANEL
ALT: 34 U/L (ref 0–35)
AST: 36 U/L (ref 0–37)
Albumin: 4.2 g/dL (ref 3.5–5.2)
Alkaline Phosphatase: 62 U/L (ref 39–117)
BUN: 10 mg/dL (ref 6–23)
CO2: 29 mEq/L (ref 19–32)
Calcium: 9.6 mg/dL (ref 8.4–10.5)
Chloride: 100 mEq/L (ref 96–112)
Creatinine, Ser: 0.9 mg/dL (ref 0.40–1.20)
GFR: 78.13 mL/min (ref >60.00)
Glucose, Bld: 96 mg/dL (ref 70–99)
Potassium: 3.5 mEq/L (ref 3.5–5.1)
Sodium: 135 mEq/L (ref 135–145)
Total Bilirubin: 0.6 mg/dL (ref 0.2–1.2)
Total Protein: 7.1 g/dL (ref 6.0–8.3)

## 2022-07-27 LAB — LIPASE: Lipase: 18 U/L (ref 11.0–59.0)

## 2022-07-27 LAB — C-REACTIVE PROTEIN: CRP: 6 mg/dL (ref 0.5–20.0)

## 2022-07-27 NOTE — Progress Notes (Signed)
Subjective  CC:  Chief Complaint  Patient presents with   Acute Visit    Stomach pain , feeling blowed  , fever last night103 , no diaherra , no nausea or vomiting,  taking Tylenol for the fever      HPI: Monica Benjamin is a 42 y.o. female who presents to the office today to address the problems listed above in the chief complaint. 42 year old female with hypertriglyceridemia, hypertension, depression presents with 2 and half day history of upper abdominal pain right greater than left, bloating, fever to 101 last night.  Pain was moderate to severe last night, according 8 out of 10.  Now she is at a 6 out of 10.  Pain is worse after eating.  Weight appetite.  Diaphoretic.  No chest pain, shortness of breath, cough, nausea, vomiting or change in bowel habits.  Specifically no constipation or diarrhea.  She has a history of GERD but this has been well-controlled on chronic PPI.  No hematemesis.  No melena.  She does have her gallbladder.  Has history of NASH.  Symptoms started abruptly.  She is only taking Tylenol.  No urinary symptoms.  No possibility of pregnancy.  Assessment  1. RUQ pain   2. Fever, unspecified fever cause      Plan  Moderate to severe right upper quadrant pain with fever: Not acute abdomen but she is tender in bilateral quadrants.  Differential diagnosis is broad including acute cholecystitis, pancreatitis, biliary colic, gastritis, colitis.  Will check stat lab work and stat ultrasound looking at biliary tract.  Risks and precautions discussed with patient.  She may continue Tylenol.  She does not want any further medication at this time.  Pending lab work and ultrasound, will treat according to results.  However if pain worsens, recommend ER visit.  Patient understands and agrees.  She is stable upon discharge  Follow up: As scheduled 01/14/2023  Orders Placed This Encounter  Procedures   US ABDOMEN LIMITED RUQ (LIVER/GB)   CBC with Differential/Platelet    Comprehensive metabolic panel   Lipase   C-reactive protein   No orders of the defined types were placed in this encounter.     I reviewed the patients updated PMH, FH, and SocHx.    Patient Active Problem List   Diagnosis Date Noted   Essential hypertension 03/16/2018    Priority: High   OSA on CPAP 05/25/2016    Priority: High   Moderate persistent asthma without complication 11/25/2015    Priority: High   NAFLD (nonalcoholic fatty liver disease) 65/78/4696    Priority: Medium    Mild episode of recurrent major depressive disorder (HCC) 05/10/2018    Priority: Medium    Hypertriglyceridemia 05/10/2018    Priority: Medium    Chronic seasonal allergic rhinitis due to pollen 11/25/2015    Priority: Medium    GAD (generalized anxiety disorder) 11/25/2015    Priority: Medium    Gastroesophageal reflux disease without esophagitis 11/25/2015    Priority: Medium    Irritable bowel syndrome with both constipation and diarrhea 11/25/2015    Priority: Medium    Vitamin D deficiency 02/26/2021    Priority: Low   Infertility, female 02/15/2017   Current Meds  Medication Sig   albuterol (VENTOLIN HFA) 108 (90 Base) MCG/ACT inhaler Inhale 2 puffs into the lungs every 4 (four) hours as needed (cough, shortness of breath or wheezing.).   cetirizine (ZYRTEC) 10 MG tablet Take 10 mg by mouth daily.  clonazePAM (KLONOPIN) 0.5 MG tablet Take 1/2-1 tablet (0.25-0.5 mg total) by mouth daily as needed for anxiety   escitalopram (LEXAPRO) 10 MG tablet Take by mouth.   fenofibrate (TRICOR) 145 MG tablet Take 1 tablet (145 mg total) by mouth at bedtime.   fluticasone (FLONASE) 50 MCG/ACT nasal spray Place 2 sprays into both nostrils daily.   hydrochlorothiazide (HYDRODIURIL) 25 MG tablet Take 1 tablet (25 mg total) by mouth daily.   Omega-3 Fatty Acids (FISH OIL) 1000 MG CAPS Take 1 capsule by mouth daily.   pantoprazole (PROTONIX) 20 MG tablet Take 1 tablet (20 mg total) by mouth daily.    vortioxetine HBr (TRINTELLIX) 10 MG TABS tablet Take 1 tablet (10 mg total) by mouth daily.    Allergies: Patient is allergic to ace inhibitors, nsaids, and prednisone. Family History: Patient family history includes Alcoholism in her father and another family member; Anxiety disorder in her mother and another family member; COPD in her paternal grandmother; Dementia in her maternal grandmother; Diabetes in her maternal grandfather; Hyperlipidemia in her father; Hypertension in her father; Migraines in her mother and sister; Osteoporosis in her mother. Social History:  Patient  reports that she has never smoked. She has never used smokeless tobacco. She reports that she does not currently use alcohol. She reports that she does not use drugs.  Review of Systems: Constitutional: Negative for fever malaise or anorexia Cardiovascular: negative for chest pain Respiratory: negative for SOB or persistent cough Gastrointestinal: negative for abdominal pain  Objective  Vitals: BP 116/82   Pulse 84   Temp 98.7 F (37.1 C)   Wt 221 lb 3.2 oz (100.3 kg)   LMP 07/06/2022   SpO2 99%   BMI 37.97 kg/m  General: Nontoxic appearing but clammy, A&Ox3 HEENT: PEERL, conjunctiva normal, anicteric, neck is supple Cardiovascular:  RRR without murmur or gallop.  Respiratory:  Good breath sounds bilaterally, CTAB with normal respiratory effort Abdomen: Quiet, soft, bilateral upper quadrant tenderness without rebound, guarding or masses.  No hepatosplenomegaly.  Lower abdomen is minimally tender without mass.  No suprapubic tenderness. Skin:  Warm, no rashes  Commons side effects, risks, benefits, and alternatives for medications and treatment plan prescribed today were discussed, and the patient expressed understanding of the given instructions. Patient is instructed to call or message via MyChart if he/she has any questions or concerns regarding our treatment plan. No barriers to understanding were  identified. We discussed Red Flag symptoms and signs in detail. Patient expressed understanding regarding what to do in case of urgent or emergency type symptoms.  Medication list was reconciled, printed and provided to the patient in AVS. Patient instructions and summary information was reviewed with the patient as documented in the AVS. This note was prepared with assistance of Dragon voice recognition software. Occasional wrong-word or sound-a-like substitutions may have occurred due to the inherent limitations of voice recognition software

## 2022-07-28 ENCOUNTER — Other Ambulatory Visit (HOSPITAL_COMMUNITY): Payer: Self-pay

## 2022-07-28 NOTE — Progress Notes (Signed)
See mychart note Whitney, All of your blood test results are normal. Your ultrasound showed fatty liver but no other abnormalities. How are your doing?? I recommend increasing protonix to twice a day, stop the fenofibrate, and let me know if you are not improving.  Sincerely, Dr. Mardelle Matte

## 2022-08-05 ENCOUNTER — Other Ambulatory Visit: Payer: Self-pay | Admitting: Oncology

## 2022-08-05 DIAGNOSIS — Z006 Encounter for examination for normal comparison and control in clinical research program: Secondary | ICD-10-CM

## 2022-08-10 ENCOUNTER — Other Ambulatory Visit (HOSPITAL_COMMUNITY): Payer: Self-pay

## 2022-08-15 ENCOUNTER — Other Ambulatory Visit (HOSPITAL_COMMUNITY): Payer: Self-pay

## 2022-08-15 ENCOUNTER — Telehealth: Payer: 59 | Admitting: Nurse Practitioner

## 2022-08-15 DIAGNOSIS — J0100 Acute maxillary sinusitis, unspecified: Secondary | ICD-10-CM

## 2022-08-15 MED ORDER — AMOXICILLIN-POT CLAVULANATE 875-125 MG PO TABS: 1.0000 | ORAL_TABLET | Freq: Two times a day (BID) | ORAL | 0 refills | Status: DC

## 2022-08-15 NOTE — Progress Notes (Signed)
E-Visit for Sinus Problems  We are sorry that you are not feeling well.  Here is how we plan to help!  Based on what you have shared with me it looks like you have sinusitis.  Sinusitis is inflammation and infection in the sinus cavities of the head.  Based on your presentation I believe you most likely have Acute Bacterial Sinusitis.  This is an infection caused by bacteria and is treated with antibiotics. I have prescribed Augmentin 875mg/125mg one tablet twice daily with food, for 7 days. You may use an oral decongestant such as Mucinex D or if you have glaucoma or high blood pressure use plain Mucinex. Saline nasal spray help and can safely be used as often as needed for congestion.  If you develop worsening sinus pain, fever or notice severe headache and vision changes, or if symptoms are not better after completion of antibiotic, please schedule an appointment with a health care provider.    Sinus infections are not as easily transmitted as other respiratory infection, however we still recommend that you avoid close contact with loved ones, especially the very young and elderly.  Remember to wash your hands thoroughly throughout the day as this is the number one way to prevent the spread of infection!  Home Care: Only take medications as instructed by your medical team. Complete the entire course of an antibiotic. Do not take these medications with alcohol. A steam or ultrasonic humidifier can help congestion.  You can place a towel over your head and breathe in the steam from hot water coming from a faucet. Avoid close contacts especially the very young and the elderly. Cover your mouth when you cough or sneeze. Always remember to wash your hands.  Get Help Right Away If: You develop worsening fever or sinus pain. You develop a severe head ache or visual changes. Your symptoms persist after you have completed your treatment plan.  Make sure you Understand these instructions. Will watch  your condition. Will get help right away if you are not doing well or get worse.  Thank you for choosing an e-visit.  Your e-visit answers were reviewed by a board certified advanced clinical practitioner to complete your personal care plan. Depending upon the condition, your plan could have included both over the counter or prescription medications.  Please review your pharmacy choice. Make sure the pharmacy is open so you can pick up prescription now. If there is a problem, you may contact your provider through MyChart messaging and have the prescription routed to another pharmacy.  Your safety is important to us. If you have drug allergies check your prescription carefully.   For the next 24 hours you can use MyChart to ask questions about today's visit, request a non-urgent call back, or ask for a work or school excuse. You will get an email in the next two days asking about your experience. I hope that your e-visit has been valuable and will speed your recovery.  Mary-Margaret Martin, FNP   5-10 minutes spent reviewing and documenting in chart.  

## 2022-08-17 ENCOUNTER — Other Ambulatory Visit: Payer: Self-pay | Admitting: Family Medicine

## 2022-08-17 ENCOUNTER — Other Ambulatory Visit (HOSPITAL_COMMUNITY): Payer: Self-pay

## 2022-08-17 DIAGNOSIS — B9689 Other specified bacterial agents as the cause of diseases classified elsewhere: Secondary | ICD-10-CM

## 2022-08-17 MED ORDER — FLUTICASONE PROPIONATE 50 MCG/ACT NA SUSP
2.0000 | Freq: Every day | NASAL | 0 refills | Status: DC
  Filled 2022-08-17: qty 16, 30d supply, fill #0

## 2022-09-01 ENCOUNTER — Telehealth: Payer: 59 | Admitting: Family Medicine

## 2022-09-01 ENCOUNTER — Other Ambulatory Visit (HOSPITAL_COMMUNITY): Payer: Self-pay

## 2022-09-01 DIAGNOSIS — B3731 Acute candidiasis of vulva and vagina: Secondary | ICD-10-CM

## 2022-09-01 MED ORDER — FLUCONAZOLE 150 MG PO TABS
150.0000 mg | ORAL_TABLET | Freq: Every day | ORAL | 0 refills | Status: AC
Start: 2022-09-01 — End: ?
  Filled 2022-09-01: qty 1, 1d supply, fill #0

## 2022-09-01 NOTE — Progress Notes (Signed)

## 2022-10-12 ENCOUNTER — Other Ambulatory Visit (HOSPITAL_COMMUNITY): Payer: Self-pay

## 2022-10-12 ENCOUNTER — Ambulatory Visit: Payer: 59 | Admitting: Family Medicine

## 2022-10-12 VITALS — BP 130/86 | HR 74 | Temp 98.7°F | Ht 64.0 in | Wt 222.2 lb

## 2022-10-12 DIAGNOSIS — Z72820 Sleep deprivation: Secondary | ICD-10-CM | POA: Diagnosis not present

## 2022-10-12 DIAGNOSIS — R140 Abdominal distension (gaseous): Secondary | ICD-10-CM

## 2022-10-12 DIAGNOSIS — Q859 Phakomatosis, unspecified: Secondary | ICD-10-CM

## 2022-10-12 DIAGNOSIS — E559 Vitamin D deficiency, unspecified: Secondary | ICD-10-CM | POA: Diagnosis not present

## 2022-10-12 DIAGNOSIS — G4733 Obstructive sleep apnea (adult) (pediatric): Secondary | ICD-10-CM | POA: Diagnosis not present

## 2022-10-12 DIAGNOSIS — N951 Menopausal and female climacteric states: Secondary | ICD-10-CM

## 2022-10-12 DIAGNOSIS — K582 Mixed irritable bowel syndrome: Secondary | ICD-10-CM

## 2022-10-12 DIAGNOSIS — K760 Fatty (change of) liver, not elsewhere classified: Secondary | ICD-10-CM

## 2022-10-12 DIAGNOSIS — F339 Major depressive disorder, recurrent, unspecified: Secondary | ICD-10-CM

## 2022-10-12 LAB — COMPREHENSIVE METABOLIC PANEL
ALT: 44 U/L — ABNORMAL HIGH (ref 0–35)
AST: 44 U/L — ABNORMAL HIGH (ref 0–37)
Albumin: 4.1 g/dL (ref 3.5–5.2)
Alkaline Phosphatase: 86 U/L (ref 39–117)
BUN: 9 mg/dL (ref 6–23)
CO2: 27 meq/L (ref 19–32)
Calcium: 9.7 mg/dL (ref 8.4–10.5)
Chloride: 100 meq/L (ref 96–112)
Creatinine, Ser: 0.8 mg/dL (ref 0.40–1.20)
GFR: 89.86 mL/min (ref >60.00)
Glucose, Bld: 99 mg/dL (ref 70–99)
Potassium: 3.5 meq/L (ref 3.5–5.1)
Sodium: 135 meq/L (ref 135–145)
Total Bilirubin: 0.5 mg/dL (ref 0.2–1.2)
Total Protein: 7.6 g/dL (ref 6.0–8.3)

## 2022-10-12 LAB — VITAMIN D 25 HYDROXY (VIT D DEFICIENCY, FRACTURES): VITD: 28.8 ng/mL — ABNORMAL LOW (ref 30.00–100.00)

## 2022-10-12 LAB — TSH: TSH: 2.53 u[IU]/mL (ref 0.35–5.50)

## 2022-10-12 MED ORDER — HYOSCYAMINE SULFATE 0.125 MG SL SUBL
0.1250 mg | SUBLINGUAL_TABLET | Freq: Three times a day (TID) | SUBLINGUAL | 2 refills | Status: AC
  Filled 2022-10-12: qty 60, 20d supply, fill #0
  Filled 2023-04-08: qty 60, 20d supply, fill #1

## 2022-10-12 NOTE — Progress Notes (Signed)
Subjective  CC:  Chief Complaint  Patient presents with   Anxiety    Pt stated that she feels like she has been having an upset stomach since she has been on Trintellix along with wt gain and chronic fatigue    HPI: Monica Benjamin is a 42 y.o. female who presents to the office today to address the problems listed above in the chief complaint. 42 year old presents due to feeling terrible for the last 3 to 6 months.  Was here in July for abdominal bloating and pain with negative workup.  Has known fatty liver and IBS.  She has seen Dr. Loreta Ave years ago.  Describes daily abdominal plain after most meals, postprandial loose stools and urgency, no blood mucus in the stools.  No localized abdominal pain.  Also complains of night sweats, premenstrual irritability, aches and pains and night and severe fatigue.  She awakens multiple times during the night due to the symptoms.  No history of insomnia.  Has history of sleep apnea no longer on CPAP machine.  Last tested many years ago.  Her mood is well-controlled on Trintellix.  She is not feel depressed.  She has some social anxiety and uses Klonopin rarely.  She does note that her mood worsens prior to her menstrual cycles and this has been noted by her wife.  She does have a GYN and is seeing her in the next few weeks.  She also has history of vitamin D deficiency.  History of hypertriglyceridemia, stop fenofibrate due to bloating symptoms worsening.  Her diet is regular American, cooks mostly at home.  Assessment  1. Irritable bowel syndrome with both constipation and diarrhea   2. Major depression, recurrent, chronic (HCC)   3. NAFLD (nonalcoholic fatty liver disease)   4. Hamartoma (HCC)   5. OSA on CPAP   6. Vitamin D deficiency   7. Abdominal bloating   8. Sleep deprivation   9. Perimenopausal symptoms      Plan  Feeling badly due to GI symptoms, aching, poor sleep: We had long discussion.  Symptoms are likely multifactorial.  Believes  she is perimenopausal with some perimenopausal mood and vasomotor changes.  She will discuss this with GYN and possibly treat with HRT.  Otherwise, her mood is well-controlled and we will continue Trintellix 10 daily.  She has long history of IBS and I believe she is in a current moderate flare.  Start FODMAP, Levsin and education.  Also believe she is chronically sleep deprived, refer for sleep apnea evaluation and trial of CPAP again if indicated.  Trial of melatonin in the meantime.  Check vitamin D due to aches and pains.  Tylenol as needed.  Close follow-up  Follow up: For complete physical 01/14/2023  Orders Placed This Encounter  Procedures   VITAMIN D 25 Hydroxy (Vit-D Deficiency, Fractures)   Comprehensive metabolic panel   TSH   Ambulatory referral to Pulmonology   Meds ordered this encounter  Medications   hyoscyamine (LEVSIN/SL) 0.125 MG SL tablet    Sig: Place 1 tablet (0.125 mg total) under the tongue 3 (three) times daily before meals. As needed for bloating and cramping    Dispense:  60 tablet    Refill:  2      I reviewed the patients updated PMH, FH, and SocHx.    Patient Active Problem List   Diagnosis Date Noted   Essential hypertension 03/16/2018    Priority: High   OSA on CPAP 05/25/2016  Priority: High   Moderate persistent asthma without complication 11/25/2015    Priority: High   Hamartoma (HCC) 10/12/2022    Priority: Medium    NAFLD (nonalcoholic fatty liver disease) 24/40/1027    Priority: Medium    Mild episode of recurrent major depressive disorder (HCC) 05/10/2018    Priority: Medium    Hypertriglyceridemia 05/10/2018    Priority: Medium    Chronic seasonal allergic rhinitis due to pollen 11/25/2015    Priority: Medium    GAD (generalized anxiety disorder) 11/25/2015    Priority: Medium    Gastroesophageal reflux disease without esophagitis 11/25/2015    Priority: Medium    Irritable bowel syndrome with both constipation and diarrhea  11/25/2015    Priority: Medium    Vitamin D deficiency 02/26/2021    Priority: Low   Infertility, female 02/15/2017    Priority: Low   Perimenopausal symptoms 10/12/2022   Current Meds  Medication Sig   hyoscyamine (LEVSIN/SL) 0.125 MG SL tablet Place 1 tablet (0.125 mg total) under the tongue 3 (three) times daily before meals. As needed for bloating and cramping    Allergies: Patient is allergic to ace inhibitors, nsaids, and prednisone. Family History: Patient family history includes Alcoholism in her father and another family member; Anxiety disorder in her mother and another family member; COPD in her paternal grandmother; Dementia in her maternal grandmother; Diabetes in her maternal grandfather; Hyperlipidemia in her father; Hypertension in her father; Migraines in her mother and sister; Osteoporosis in her mother. Social History:  Patient  reports that she has never smoked. She has never used smokeless tobacco. She reports that she does not currently use alcohol. She reports that she does not use drugs.  Review of Systems: Constitutional: Negative for fever malaise or anorexia Cardiovascular: negative for chest pain Respiratory: negative for SOB or persistent cough Gastrointestinal: negative for abdominal pain  Objective  Vitals: BP 130/86   Pulse 74   Temp 98.7 F (37.1 C)   Ht 5\' 4"  (1.626 m)   Wt 222 lb 3.2 oz (100.8 kg)   SpO2 98%   BMI 38.14 kg/m  General: no acute distress , A&Ox3 HEENT: PEERL, conjunctiva normal, neck is supple Cardiovascular:  RRR without murmur or gallop.  Respiratory:  Good breath sounds bilaterally, CTAB with normal respiratory effort Gastrointestinal: soft, bloated abdomen, normal active bowel sounds, no palpable masses, no hepatosplenomegaly, no appreciated hernias Diffuse mild tenderness without rebound or guarding Skin:  Warm, no rashes  Lab Results  Component Value Date   WBC 6.9 07/27/2022   HGB 13.0 07/27/2022   HCT 38.5  07/27/2022   MCV 88.2 07/27/2022   PLT 262.0 07/27/2022   Lab Results  Component Value Date   ALT 34 07/27/2022   AST 36 07/27/2022   ALKPHOS 62 07/27/2022   BILITOT 0.6 07/27/2022   Lab Results  Component Value Date   NA 135 07/27/2022   CL 100 07/27/2022   K 3.5 07/27/2022   CO2 29 07/27/2022   BUN 10 07/27/2022   CREATININE 0.90 07/27/2022   GFR 78.13 07/27/2022   CALCIUM 9.6 07/27/2022   ALBUMIN 4.2 07/27/2022   GLUCOSE 96 07/27/2022   Lab Results  Component Value Date   TSH 1.670 02/12/2021   Last vitamin D Lab Results  Component Value Date   VD25OH 24.3 (L) 02/12/2021    Commons side effects, risks, benefits, and alternatives for medications and treatment plan prescribed today were discussed, and the patient expressed understanding of the given  instructions. Patient is instructed to call or message via MyChart if he/she has any questions or concerns regarding our treatment plan. No barriers to understanding were identified. We discussed Red Flag symptoms and signs in detail. Patient expressed understanding regarding what to do in case of urgent or emergency type symptoms.  Medication list was reconciled, printed and provided to the patient in AVS. Patient instructions and summary information was reviewed with the patient as documented in the AVS. This note was prepared with assistance of Dragon voice recognition software. Occasional wrong-word or sound-a-like substitutions may have occurred due to the inherent limitations of voice recognition software

## 2022-10-12 NOTE — Patient Instructions (Addendum)
Please follow up as scheduled for your next visit with me: 01/14/2023   I will release your lab results to you on your MyChart account with further instructions. You may see the results before I do, but when I review them I will send you a message with my report or have my assistant call you if things need to be discussed. Please reply to my message with any questions. Thank you!   If you have any questions or concerns, please don't hesitate to send me a message via MyChart or call the office at [phone]. Thank you for visiting with [place] today! It's our pleasure caring for you.   Low-FODMAP Eating Plan  FODMAP stands for fermentable oligosaccharides, disaccharides, monosaccharides, and polyols. These are sugars that are hard for some people to digest. A low-FODMAP eating plan may help some people who have irritable bowel syndrome (IBS) and certain other bowel (intestinal) diseases to manage their symptoms. This meal plan can be complicated to follow. Work with a diet and nutrition specialist (dietitian) to make a low-FODMAP eating plan that is right for you. A dietitian can help make sure that you get enough nutrition from this diet. What are tips for following this plan? Reading food labels Check labels for hidden FODMAPs such as: High-fructose syrup. Honey. Agave. Natural fruit flavors. Onion or garlic powder. Choose low-FODMAP foods that contain 3-4 grams of fiber per serving. Check food labels for serving sizes. Eat only one serving at a time to make sure FODMAP levels stay low. Shopping Shop with a list of foods that are recommended on this diet and make a meal plan. Meal planning Follow a low-FODMAP eating plan for up to 6 weeks, or as told by your health care provider or dietitian. To follow the eating plan: Eliminate high-FODMAP foods from your diet completely. Choose only low-FODMAP foods to eat. You will do this for 2-6 weeks. Gradually reintroduce high-FODMAP foods into your  diet one at a time. Most people should wait a few days before introducing the next new high-FODMAP food into their meal plan. Your dietitian can recommend how quickly you may reintroduce foods. Keep a daily record of what and how much you eat and drink. Make note of any symptoms that you have after eating. Review your daily record with a dietitian regularly to identify which foods you can eat and which foods you should avoid. General tips Drink enough fluid each day to keep your urine pale yellow. Avoid processed foods. These often have added sugar and may be high in FODMAPs. Avoid most dairy products, whole grains, and sweeteners. Work with a dietitian to make sure you get enough fiber in your diet. Avoid high FODMAP foods at meals to manage symptoms. Recommended foods Fruits Bananas, oranges, tangerines, lemons, limes, blueberries, raspberries, strawberries, grapes, cantaloupe, honeydew melon, kiwi, papaya, passion fruit, and pineapple. Limited amounts of dried cranberries, banana chips, and shredded coconut. Vegetables Eggplant, zucchini, cucumber, peppers, green beans, bean sprouts, lettuce, arugula, kale, Swiss chard, spinach, collard greens, bok choy, summer squash, potato, and tomato. Limited amounts of corn, carrot, and sweet potato. Green parts of scallions. Grains Gluten-free grains, such as rice, oats, buckwheat, quinoa, corn, polenta, and millet. Gluten-free pasta, bread, or cereal. Rice noodles. Corn tortillas. Meats and other proteins Unseasoned beef, pork, poultry, or fish. Eggs. Tomasa Blase. Tofu (firm) and tempeh. Limited amounts of nuts and seeds, such as almonds, walnuts, [place] nuts, pecans, peanuts, nut butters, pumpkin seeds, chia seeds, and sunflower seeds. Dairy Lactose-free milk, yogurt,  and kefir. Lactose-free cottage cheese and ice cream. Non-dairy milks, such as almond, coconut, hemp, and rice milk. Non-dairy yogurt. Limited amounts of goat cheese, brie, mozzarella, parmesan,  swiss, and other hard cheeses. Fats and oils Butter-free spreads. Vegetable oils, such as olive, canola, and sunflower oil. Seasoning and other foods Artificial sweeteners with names that do not end in "ol," such as aspartame, saccharine, and stevia. Maple syrup, white table sugar, raw sugar, brown sugar, and molasses. Mayonnaise, soy sauce, and tamari. Fresh basil, coriander, parsley, rosemary, and thyme. Beverages Water and mineral water. Sugar-sweetened soft drinks. Small amounts of orange juice or cranberry juice. Black and green tea. Most dry wines. Coffee. The items listed above may not be a complete list of foods and beverages you can eat. Contact a dietitian for more information. Foods to avoid Fruits Fresh, dried, and juiced forms of apple, pear, watermelon, peach, plum, cherries, apricots, blackberries, boysenberries, figs, nectarines, and mango. Avocado. Vegetables Chicory root, artichoke, asparagus, cabbage, snow peas, Brussels sprouts, broccoli, sugar snap peas, mushrooms, celery, and cauliflower. Onions, garlic, leeks, and the white part of scallions. Grains Wheat, including kamut, durum, and semolina. Barley and bulgur. Couscous. Wheat-based cereals. Wheat noodles, bread, crackers, and pastries. Meats and other proteins Fried or fatty meat. Sausage. Cashews and pistachios. Soybeans, baked beans, black beans, chickpeas, kidney beans, fava beans, navy beans, lentils, black-eyed peas, and split peas. Dairy Milk, yogurt, ice cream, and soft cheese. Cream and sour cream. Milk-based sauces. Custard. Buttermilk. Soy milk. Seasoning and other foods Any sugar-free gum or candy. Foods that contain artificial sweeteners such as sorbitol, mannitol, isomalt, or xylitol. Foods that contain honey, high-fructose corn syrup, or agave. Bouillon, vegetable stock, beef stock, and chicken stock. Garlic and onion powder. Condiments made with onion, such as hummus, chutney, pickles, relish, salad  dressing, and salsa. Tomato paste. Beverages Chicory-based drinks. Coffee substitutes. Chamomile tea. Fennel tea. Sweet or fortified wines such as port or sherry. Diet soft drinks made with isomalt, mannitol, maltitol, sorbitol, or xylitol. Apple, pear, and mango juice. Juices with high-fructose corn syrup. The items listed above may not be a complete list of foods and beverages you should avoid. Contact a dietitian for more information. Summary FODMAP stands for fermentable oligosaccharides, disaccharides, monosaccharides, and polyols. These are sugars that are hard for some people to digest. A low-FODMAP eating plan is a short-term diet that helps to ease symptoms of certain bowel diseases. The eating plan usually lasts up to 6 weeks. After that, high-FODMAP foods are reintroduced gradually and one at a time. This can help you find out which foods may be causing symptoms. A low-FODMAP eating plan can be complicated. It is best to work with a dietitian who has experience with this type of plan. This information is not intended to replace advice given to you by your health care provider. Make sure you discuss any questions you have with your health care provider. Document Revised: 06/01/2019 Document Reviewed: 06/01/2019 Elsevier Patient Education  2024 Else[employer] Inc.   Irritable Bowel Syndrome, Adult  Irritable bowel syndrome (IBS) is a group of symptoms that affects the organs responsible for digestion (gastrointestinal tract, or GI tract). IBS is not one specific disease. To regulate how the GI tract works, the body sends signals back and forth between the intestines and the brain. If you have IBS, there may be a problem with these signals. As a result, the GI tract does not function normally. The intestines may become more sensitive and overreact to certain things.  This may be especially true when you eat certain foods or when you are under stress. There are four main types of IBS. These may be  determined based on the consistency of your stool (feces): IBS with mostly (predominance of) diarrhea. IBS with predominance of constipation. IBS with mixed bowel habits. This includes both diarrhea and constipation. IBS unclassified. This includes IBS that cannot be categorized into one of the other three main types. It is important to know which type of IBS you have. Certain treatments are more likely to be helpful for certain types of IBS. What are the causes? The exact cause of IBS is not known. What increases the risk? You may have a higher risk for IBS if you: Are female. Are younger than 40 years. Have a family history of IBS. Have a mental health condition, such as depression, anxiety, or post-traumatic stress disorder. Have had a bacterial infection of your GI tract. What are the signs or symptoms? Symptoms of IBS vary from person to person. The main symptom is abdominal pain or discomfort. Other symptoms usually include one or more of the following: Diarrhea, constipation, or both. Swelling or bloating in the abdomen. Feeling full after eating a small or regular-sized meal. Frequent gas. Mucus in the stool. A feeling of having more stool left after a bowel movement. Symptoms tend to come and go. They may be triggered by stress, mental health conditions, or certain foods. How is this diagnosed? This condition may be diagnosed based on a physical exam, your medical history, and your symptoms. You may have tests, such as: Blood tests. Stool test. Colonoscopy. This is a procedure in which your GI tract is viewed with a long, thin, flexible tube. How is this treated? There is no cure for IBS, but treatment can help relieve symptoms. Treatment depends on the type of IBS you have, and may include: Changes to your diet, such as: Avoiding foods that cause symptoms. Drinking more water. Following a low-FODMAP (fermentable oligosaccharides, disaccharides, monosaccharides, and  polyols) diet for up to 6 weeks, or as told by your health care provider. FODMAPs are sugars that are hard for some people to digest. Eating more fiber. Eating small meals at the same times every day. Medicines. These may include: Fiber supplements, if you have constipation. Medicine to control diarrhea (antidiarrheal medicines). Medicine to help control muscle tightening (spasms) in your GI tract (antispasmodic medicines). Medicines to help with mental health conditions, such as antidepressants. Talk therapy or counseling. Working with a dietitian to help create a food plan that is right for you. Managing your stress. Follow these instructions at home: Eating and drinking  Eat a healthy diet. Eat 5-6 small meals a day. Try to eat meals at about the same times each day. Do not eat large meals. Gradually eat more fiber-rich foods. These include whole grains, fruits, and vegetables. This may be especially helpful if you have IBS with constipation. Eat a diet low in FODMAPs. You may need to avoid foods such as citrus fruits, cabbage, garlic, and onions. Drink enough fluid to keep your urine pale yellow. Keep a journal of foods that seem to trigger symptoms. Avoid foods and drinks that: Contain added sugar. Make your symptoms worse. These may include dairy products, caffeinated drinks, and carbonated drinks. Alcohol use Do not drink alcohol if: Your health care provider tells you not to drink. You are pregnant, may be pregnant, or are planning to become pregnant. If you drink alcohol: Limit how much you have  to: 0-1 drink a day for women. 0-2 drinks a day for men. Know how much alcohol is in your drink. In the [place] one drink equals one 12 oz bottle of beer (355 mL), one 5 oz glass of wine (148 mL), or one 1 oz glass of hard liquor (44 mL) General instructions Take over-the-counter and prescription medicines only as told by your health care provider. This includes supplements. Get  enough exercise. Do at least 150 minutes of moderate-intensity exercise each week. Manage your stress. Getting enough sleep and exercise can help you manage stress. Keep all follow-up visits. This is important. This includes all visits with your health care provider and therapist. Where to find more information International Foundation for Functional Gastrointestinal Disorders: aboutibs.[employer] of Diabetes and Digestive and Kidney Diseases: [employer]si Contact a health care provider if: You have constant pain. You lose weight. You have diarrhea that gets worse. You have bleeding from the rectum. You vomit often. You have a fever. Get help right away if: You have severe abdominal pain. You have diarrhea with symptoms of dehydration, such as dizziness or dry mouth. You have bloody or black stools. You have severe abdominal bloating. You have vomiting that does not stop. You have blood in your vomit. Summary Irritable bowel syndrome (IBS) is not one specific disease. It is a group of symptoms that affects digestion. Your intestines may become more sensitive and overreact to certain things. This may be especially true when you eat certain foods or when you are under stress. There is no cure for IBS, but treatment can help relieve symptoms. This information is not intended to replace advice given to you by your health care provider. Make sure you discuss any questions you have with your health care provider. Document Revised: 12/25/2020 Document Reviewed: 12/25/2020 Elsevier Patient Education  2024 [employer].

## 2022-10-13 NOTE — Progress Notes (Signed)
See mychart note Dear Monica Benjamin, Your lab work looks fine. Your liver tests are mildly elevated again but I believe this is from fatty liver. Your vitamin D level is just below normal, so I recommend taking Vit D 4000-5000 units daily (check to see what your current dose is and increase slightly). No other abnormalities were found.  I want to get your IBS calmed down; work on the diet and use the levsin.  Sincerely, Dr. Mardelle Matte

## 2022-10-15 NOTE — Progress Notes (Signed)
42 y.o. G40P0000 Married Caucasian female here for annual exam.  Wants to discuss hormones. Having night sweats, bloating, fatigue joint pain, and mood swings before her period.   Wants to start by just checking her hormones today.  Her mother had menopause in her 33s.   No period after breast reduction in December, 2023 and skipped some cycles after this.  They have returned but are light.   Has sleep apnea and is not using her CPAP machine.  She will see a new pulmonologist.   Has a son 2.5 yo, daughter 55 yo. Her mother is getting remarried.  Works for [employer].  Works in [employer].   PCP:  Dr. Mardelle Matte  Patient's last menstrual period was 10/20/2022.           Sexually active: Yes.    Has a wife. The current method of family planning is none.    Exercising: Yes.     Walking, golf Smoker:  no  Health Maintenance: Pap:  04-01-20 Neg:Neg HR HPV, 10/17/18 Neg,  05/27/15 Neg:Neg HR HPV  History of abnormal Pap:  no MMG:   02-21-21 partially fatty asymmetry in Lt.Br.;consistent w/benign hamartoma/Neg/Birads2/screening 7yr  Colonoscopy:  done a few years ago per pt with Dr. Loreta Ave.  Due at age 79 per patient. BMD:   n/a  Result  n/a TDaP:  10/31/13 Gardasil:   yes HIV: 05/24/15 NR Hep C: n/a Screening Labs:  PCP   reports that she has never smoked. She has never used smokeless tobacco. She reports that she does not currently use alcohol. She reports that she does not use drugs.  Past Medical History:  Diagnosis Date   Allergy    Anxiety    Asthma    Back pain    Depression    GERD (gastroesophageal reflux disease)    Hyperlipidemia    Hypertension    IBS (irritable bowel syndrome)    Infertility, female 02/15/2017   IVF treatment   Lumbar herniated disc    L4-L5   Migraines    Other fatigue    Shortness of breath on exertion    Sleep apnea    Trigeminal neuralgia    Vitamin D deficiency 01/26/2021    Past Surgical History:  Procedure Laterality Date   BREAST REDUCTION  SURGERY Bilateral 12/31/2021   Procedure: BREAST REDUCTION WITH LIPOSUCTION;  Surgeon: Peggye Form, DO;  Location:  [facility];  Service: Plastics;  Laterality: Bilateral;   WISDOM TOOTH EXTRACTION      Current Outpatient Medications  Medication Sig Dispense Refill   albuterol (VENTOLIN HFA) 108 (90 Base) MCG/ACT inhaler Inhale 2 puffs into the lungs every 4 (four) hours as needed (cough, shortness of breath or wheezing.). 18 g 1   cetirizine (ZYRTEC) 10 MG tablet Take 10 mg by mouth daily.     clonazePAM (KLONOPIN) 0.5 MG tablet Take 1/2-1 tablet (0.25-0.5 mg total) by mouth daily as needed for anxiety 20 tablet 1   fluticasone (FLONASE) 50 MCG/ACT nasal spray Place 2 sprays into both nostrils daily. 16 g 0   hydrochlorothiazide (HYDRODIURIL) 25 MG tablet Take 1 tablet (25 mg total) by mouth daily. 90 tablet 3   hyoscyamine (LEVSIN/SL) 0.125 MG SL tablet Place 1 tablet (0.125 mg total) under the tongue 3 (three) times daily before meals as needed for bloating and cramping 60 tablet 2   Omega-3 Fatty Acids (FISH OIL) 1000 MG CAPS Take 1 capsule by mouth daily.     pantoprazole (PROTONIX) 20  MG tablet Take 1 tablet (20 mg total) by mouth daily. 90 tablet 3   vortioxetine HBr (TRINTELLIX) 10 MG TABS tablet Take 1 tablet (10 mg total) by mouth daily. 90 tablet 3   No current facility-administered medications for this visit.    Family History  Problem Relation Age of Onset   Anxiety disorder Mother    Osteoporosis Mother    Migraines Mother    Alcoholism Father    Hyperlipidemia Father    Hypertension Father    Migraines Sister    Dementia Maternal Grandmother    Diabetes Maternal Grandfather    COPD Paternal Grandmother    Anxiety disorder Other    Alcoholism Other    Cancer Neg Hx    Heart disease Neg Hx     Review of Systems  All other systems reviewed and are negative.   Exam:   BP 126/82 (BP Location: Right Arm, Patient Position: Sitting, Cuff Size:  Normal)   Pulse 90   Ht 5' 4.25" (1.632 m)   Wt 221 lb (100.2 kg)   LMP 10/20/2022   SpO2 97%   BMI 37.64 kg/m     General appearance: alert, cooperative and appears stated age Head: normocephalic, without obvious abnormality, atraumatic Neck: no adenopathy, supple, symmetrical, trachea midline and thyroid normal to inspection and palpation Lungs: clear to auscultation bilaterally Breasts: consistent with bilateral reduction, no masses or tenderness, No nipple retraction or dimpling, No nipple discharge or bleeding, No axillary adenopathy Heart: regular rate and rhythm Abdomen: soft, non-tender; no masses, no organomegaly Extremities: extremities normal, atraumatic, no cyanosis or edema Skin: skin color, texture, turgor normal. No rashes or lesions Lymph nodes: cervical, supraclavicular, and axillary nodes normal. Neurologic: grossly normal  Pelvic: External genitalia:  no lesions              No abnormal inguinal nodes palpated.              Urethra:  normal appearing urethra with no masses, tenderness or lesions              Bartholins and Skenes: normal                 Vagina: normal appearing vagina with normal color and discharge, no lesions              Cervix: no lesions              Pap taken: no Bimanual Exam:  Uterus:  normal size, contour, position, consistency, mobility, non-tender              Adnexa: no mass, fullness, tenderness              Rectal exam: yes.  Confirms.              Anus:  normal sphincter tone, no lesions  Chaperone was present for exam:  Warren Lacy, CMA  Assessment:   Well woman visit with gynecologic exam. Menopausal symptoms.  Probable left breast benign hamartoma.  Followed with imaging.  Status post bilateral breast reduction.  Hypercholesterolemia and hypertriglyceridemia.  NSAID allergy. HTN.  Elevated liver function studies.  Fatty liver on ultrasound.   Plan: Mammogram screening discussed.  She will schedule. Self breast awareness  reviewed. Pap and HR HPV 2027. Guidelines for Calcium, Vitamin D, regular exercise program including cardiovascular and weight bearing exercise. Will check FSH and estradiol.  Follow up annually and prn.   After visit summary provided.

## 2022-10-22 ENCOUNTER — Ambulatory Visit: Payer: 59 | Admitting: Family Medicine

## 2022-10-24 ENCOUNTER — Emergency Department (HOSPITAL_BASED_OUTPATIENT_CLINIC_OR_DEPARTMENT_OTHER): Payer: 59

## 2022-10-24 ENCOUNTER — Other Ambulatory Visit: Payer: Self-pay

## 2022-10-24 ENCOUNTER — Encounter (HOSPITAL_BASED_OUTPATIENT_CLINIC_OR_DEPARTMENT_OTHER): Payer: Self-pay

## 2022-10-24 ENCOUNTER — Emergency Department (HOSPITAL_BASED_OUTPATIENT_CLINIC_OR_DEPARTMENT_OTHER)
Admission: EM | Admit: 2022-10-24 | Discharge: 2022-10-24 | Disposition: A | Discharge status: Home / Self Care | Payer: 59 | Attending: Emergency Medicine | Admitting: Emergency Medicine

## 2022-10-24 DIAGNOSIS — I10 Essential (primary) hypertension: Secondary | ICD-10-CM | POA: Diagnosis not present

## 2022-10-24 DIAGNOSIS — E876 Hypokalemia: Secondary | ICD-10-CM | POA: Insufficient documentation

## 2022-10-24 DIAGNOSIS — R0600 Dyspnea, unspecified: Secondary | ICD-10-CM

## 2022-10-24 DIAGNOSIS — Z20822 Contact with and (suspected) exposure to covid-19: Secondary | ICD-10-CM | POA: Insufficient documentation

## 2022-10-24 DIAGNOSIS — R0602 Shortness of breath: Secondary | ICD-10-CM | POA: Diagnosis not present

## 2022-10-24 DIAGNOSIS — Z79899 Other long term (current) drug therapy: Secondary | ICD-10-CM | POA: Insufficient documentation

## 2022-10-24 DIAGNOSIS — R531 Weakness: Secondary | ICD-10-CM | POA: Diagnosis present

## 2022-10-24 LAB — BASIC METABOLIC PANEL
Anion gap: 13 (ref 5–15)
BUN: 13 mg/dL (ref 6–20)
CO2: 25 mmol/L (ref 22–32)
Calcium: 9.8 mg/dL (ref 8.9–10.3)
Chloride: 99 mmol/L (ref 98–111)
Creatinine, Ser: 0.9 mg/dL (ref 0.44–1.00)
GFR, Estimated: >60 mL/min (ref >60)
Glucose, Bld: 113 mg/dL — ABNORMAL HIGH (ref 70–99)
Potassium: 3.3 mmol/L — ABNORMAL LOW (ref 3.5–5.1)
Sodium: 137 mmol/L (ref 135–145)

## 2022-10-24 LAB — CBC
HCT: 41 % (ref 36.0–46.0)
Hemoglobin: 14.2 g/dL (ref 12.0–15.0)
MCH: 30.5 pg (ref 26.0–34.0)
MCHC: 34.6 g/dL (ref 30.0–36.0)
MCV: 88.2 fL (ref 80.0–100.0)
Platelets: 326 10*3/uL (ref 150–400)
RBC: 4.65 MIL/uL (ref 3.87–5.11)
RDW: 12.8 % (ref 11.5–15.5)
WBC: 10.6 10*3/uL — ABNORMAL HIGH (ref 4.0–10.5)
nRBC: 0 % (ref 0.0–0.2)

## 2022-10-24 LAB — URINALYSIS, MICROSCOPIC (REFLEX)

## 2022-10-24 LAB — PREGNANCY, URINE: Preg Test, Ur: NEGATIVE

## 2022-10-24 LAB — URINALYSIS, ROUTINE W REFLEX MICROSCOPIC
Bilirubin Urine: NEGATIVE
Glucose, UA: NEGATIVE mg/dL
Ketones, ur: NEGATIVE mg/dL
Leukocytes,Ua: NEGATIVE
Nitrite: NEGATIVE
Protein, ur: NEGATIVE mg/dL
Specific Gravity, Urine: 1.01 (ref 1.005–1.030)
pH: 6 (ref 5.0–8.0)

## 2022-10-24 LAB — SARS CORONAVIRUS 2 BY RT PCR: SARS Coronavirus 2 by RT PCR: NEGATIVE

## 2022-10-24 LAB — BRAIN NATRIURETIC PEPTIDE: B Natriuretic Peptide: 36.5 pg/mL (ref 0.0–100.0)

## 2022-10-24 LAB — TROPONIN I (HIGH SENSITIVITY): Troponin I (High Sensitivity): 2 ng/L (ref <18)

## 2022-10-24 MED ORDER — ACETAMINOPHEN 325 MG PO TABS
650.0000 mg | ORAL_TABLET | Freq: Once | ORAL | Status: AC
  Administered 2022-10-24: 650 mg via ORAL
  Filled 2022-10-24: qty 2

## 2022-10-24 MED ORDER — POTASSIUM CHLORIDE CRYS ER 20 MEQ PO TBCR
40.0000 meq | EXTENDED_RELEASE_TABLET | Freq: Once | ORAL | Status: AC
  Administered 2022-10-24: 40 meq via ORAL
  Filled 2022-10-24: qty 2

## 2022-10-24 NOTE — ED Triage Notes (Signed)
The patient has been having dizzy spells this week. The last two days she has been having shortness of breath. No cough or fever.

## 2022-10-24 NOTE — ED Provider Notes (Signed)
Shortness of breath x2 weeks, should be using CPAP at home but not using CPAP. No chest pain. Follow up CXR and BNP. Plan to follow up with pulmonology.  Physical Exam  BP 116/70   Pulse 63   Temp 97.6 F (36.4 C) (Oral)   Resp 16   Ht 5\' 4"  (1.626 m)   Wt 99.8 kg   LMP 10/20/2022   SpO2 97%   BMI 37.76 kg/m   Physical Exam Vitals and nursing note reviewed.  Constitutional:      Appearance: Normal appearance.     Comments: Patient comfortable appearing, talking in full sentences  HENT:     Head: Atraumatic.  Cardiovascular:     Rate and Rhythm: Normal rate and regular rhythm.  Pulmonary:     Effort: Pulmonary effort is normal.     Comments: Lungs clear to auscultation bilaterally  Musculoskeletal:     Right lower leg: No edema.     Left lower leg: No edema.  Neurological:     General: No focal deficit present.     Mental Status: She is alert.  Psychiatric:        Mood and Affect: Mood normal.        Behavior: Behavior normal.     Procedures  Procedures  ED Course / MDM    Medical Decision Making Amount and/or Complexity of Data Reviewed Labs: ordered. Radiology: ordered.  Risk OTC drugs. Prescription drug management.   In short, this is a 42 year old female coming in with couple weeks of shortness of breath which coincided with discontinued use of her CPAP machine.  No chest pain, diaphoresis, abdominal pain.   Vital signs stable here, 99% on room air, no tachycardia or fever.  She is comfortable appearing, talking in full sentences.  Workup overall reassuring.  Troponin of 2 with normal sinus rhythm on EKG. BNP within normal limits, no lower extremity swelling, no concern for CHF at this time.  COVID-negative.  Urinalysis without any signs of UTI.  CBC unremarkable.  BMP with slightly low potassium at 3.3, patient given 40 mEq of potassium here today.  Appropriate for discharge at this time with close follow-up with pulmonologist.  Feel that her symptoms  are likely due to discontinued use of her CPAP machine.  She states she already got a referral from her primary care, and has an appointment with the pulmonologist in November.  Strict return precautions discussed.      Arabella Merles, PA-C 10/24/22 1921    Charlynne Pander, MD 10/24/22 [phone]

## 2022-10-24 NOTE — ED Provider Notes (Signed)
[facility] Provider Note   CSN: [id number] Arrival date & time: 10/24/22  1606     History  Chief Complaint  Patient presents with   Dizziness   Shortness of Breath    Monica Benjamin is a 42 y.o. female.  Vitamin D deficiency, GERD, hypertension, hyperlipidemia, IBS, OSA on CPAP presented for 2 weeks of generalized weakness and shortness of breath.  Patient becomes short of breath when exerting herself.  Patient does note that she has not been using her CPAP machine at home as this may be contributing to symptoms.  Patient denies history of heart failure but does note that she feels swollen.  Patient has any fevers, chest pain, wheezing, changes sensation/motor skills, new onset weakness, neck pain, vision changes, fevers.   Home Medications Prior to Admission medications   Medication Sig Start Date End Date Taking? Authorizing Provider  albuterol (VENTOLIN HFA) 108 (90 Base) MCG/ACT inhaler Inhale 2 puffs into the lungs every 4 (four) hours as needed (cough, shortness of breath or wheezing.). 05/29/20   Daphine Deutscher Mary-Margaret, FNP  budesonide-formoterol (SYMBICORT) 80-4.5 MCG/ACT inhaler Inhale 2 puffs into the lungs 2 (two) times daily. Patient not taking: Reported on 07/27/2022 10/01/20   Allwardt, Crist Infante, PA-C  cetirizine (ZYRTEC) 10 MG tablet Take 10 mg by mouth daily.    [provider]  clonazePAM (KLONOPIN) 0.5 MG tablet Take 1/2-1 tablet (0.25-0.5 mg total) by mouth daily as needed for anxiety 01/12/22   Willow Ora, MD  fenofibrate (TRICOR) 145 MG tablet Take 1 tablet (145 mg total) by mouth at bedtime. 01/15/22   Willow Ora, MD  fluconazole (DIFLUCAN) 150 MG tablet Take 1 tablet (150 mg total) by mouth as a single dose 09/01/22   Freddy Finner, NP  fluticasone [facility]) 50 MCG/ACT nasal spray Place 2 sprays into both nostrils daily. 08/17/22   Willow Ora, MD  hydrochlorothiazide (HYDRODIURIL) 25 MG tablet  Take 1 tablet (25 mg total) by mouth daily. 01/12/22   Willow Ora, MD  hyoscyamine (LEVSIN/SL) 0.125 MG SL tablet Place 1 tablet (0.125 mg total) under the tongue 3 (three) times daily before meals as needed for bloating and cramping 10/12/22   Willow Ora, MD  Omega-3 Fatty Acids (FISH OIL) 1000 MG CAPS Take 1 capsule by mouth daily.    [provider]  pantoprazole (PROTONIX) 20 MG tablet Take 1 tablet (20 mg total) by mouth daily. 01/12/22   Willow Ora, MD  vortioxetine HBr (TRINTELLIX) 10 MG TABS tablet Take 1 tablet (10 mg total) by mouth daily. 01/12/22   Willow Ora, MD      Allergies    Ace inhibitors, Nsaids, and Prednisone    Review of Systems   Review of Systems  Respiratory:  Positive for shortness of breath.   Neurological:  Positive for dizziness.    Physical Exam Updated Vital Signs BP (!) 141/81   Pulse 64   Temp 97.6 F (36.4 C) (Oral)   Resp 16   Ht 5\' 4"  (1.626 m)   Wt 99.8 kg   LMP 10/20/2022   SpO2 99%   BMI 37.76 kg/m  Physical Exam Vitals reviewed.  Constitutional:      General: She is not in acute distress. HENT:     Head: Normocephalic and atraumatic.  Eyes:     Extraocular Movements: Extraocular movements intact.     Conjunctiva/sclera: Conjunctivae normal.     Pupils:  Pupils are equal, round, and reactive to light.     Comments: No nystagmus noted  Cardiovascular:     Rate and Rhythm: Normal rate and regular rhythm.     Pulses: Normal pulses.     Heart sounds: Normal heart sounds.     Comments: 2+ bilateral radial/dorsalis pedis pulses with regular rate Pulmonary:     Effort: Pulmonary effort is normal. No respiratory distress.     Breath sounds: Normal breath sounds.  Abdominal:     Palpations: Abdomen is soft.     Tenderness: There is no abdominal tenderness. There is no guarding or rebound.  Musculoskeletal:        General: Normal range of motion.     Cervical back: Normal range of motion and neck supple.      Right lower leg: No edema.     Left lower leg: No edema.     Comments: 5 out of 5 bilateral grip/leg extension strength No calf tenderness  Skin:    General: Skin is warm and dry.     Capillary Refill: Capillary refill takes less than 2 seconds.  Neurological:     General: No focal deficit present.     Mental Status: She is alert and oriented to person, place, and time.     Comments: Sensation intact in all 4 limbs  Psychiatric:        Mood and Affect: Mood normal.     ED Results / Procedures / Treatments   Labs (all labs ordered are listed, but only abnormal results are displayed) Labs Reviewed  BASIC METABOLIC PANEL - Abnormal; Notable for the following components:      Result Value   Potassium 3.3 (*)    Glucose, Bld 113 (*)    All other components within normal limits  CBC - Abnormal; Notable for the following components:   WBC 10.6 (*)    All other components within normal limits  URINALYSIS, ROUTINE W REFLEX MICROSCOPIC - Abnormal; Notable for the following components:   Hgb urine dipstick TRACE (*)    All other components within normal limits  URINALYSIS, MICROSCOPIC (REFLEX) - Abnormal; Notable for the following components:   Bacteria, UA FEW (*)    All other components within normal limits  SARS CORONAVIRUS 2 BY RT PCR  PREGNANCY, URINE  BRAIN NATRIURETIC PEPTIDE  TROPONIN I (HIGH SENSITIVITY)    EKG None  Radiology No results found.  Procedures Procedures    Medications Ordered in ED Medications  potassium chloride SA (KLOR-CON M) CR tablet 40 mEq (40 mEq Oral Given 10/24/22 1810)  acetaminophen (TYLENOL) tablet 650 mg (650 mg Oral Given 10/24/22 1820)    ED Course/ Medical Decision Making/ A&P                                 Medical Decision Making Amount and/or Complexity of Data Reviewed Labs: ordered. Radiology: ordered.  Risk Prescription drug management.   Monica Benjamin 42 y.o. presented today for shortness of breath. Working  DDx that I considered at this time includes, but not limited to, noncompliance with CPAP, heart failure, PE, ACS, electrolyte imbalance, anemia.  R/o DDx: Pending  Review of prior external notes: 10/12/2022 office visit  Unique Tests and My Interpretation:  CBC: Unremarkable BMP: Mild hypokalemia 3.3 UA: Unremarkable COVID: Negative Troponin: 2 BMP: Pending Chest x-ray: Pending EKG: Sinus 64 bpm, no ST elevations or depressions or blocks  noted  Discussion with Independent Historian:  Family member  Discussion of Management of Tests: None  Risk: Medium: prescription drug management  Risk Stratification Score: PERC 0  Plan: On exam patient was no acute distress stable vitals.  Patient on exam had reassuring physical exam with no significant findings.  Patient's labs in triage show that she is mildly hypokalemic and will give potassium.  Patient states that she was short of breath with exertion however does not appear fluid overloaded on exam but will get BNP along with EKG and 1 troponin to rule out cardiogenic causes.  Suspect patient's symptoms are caused from noncompliance to CPAP as this all began when she said using her CPAP machine as she is in between pulmonologist and anticipate discharge with pulmonology follow-up.  Patient is PERC 0 and so very low suspicion of PE given that and reassuring physical exam.  Patient signed out to Arabella Merles, PA-C.  Please review their note for the continuation of patient's care.  The plan at this point is follow-up on labs and anticipate discharge with pulmonology follow-up         Final Clinical Impression(s) / ED Diagnoses Final diagnoses:  Hypokalemia    Rx / DC Orders ED Discharge Orders     None         Remi Deter 10/24/22 1835    Charlynne Pander, MD 10/24/22 2312

## 2022-10-24 NOTE — Discharge Instructions (Signed)
Please attend your pulmonology appointment in November, you may contact them to see if they can move up your appointment.  Please continue to use your CPAP machine at night as instructed by your pulmonologist.  Return to the ER for any chest pain, worsening shortness of breath to where you cannot talk in full sentences, any other new or concerning symptoms.

## 2022-10-28 ENCOUNTER — Other Ambulatory Visit: Payer: Self-pay | Admitting: Family Medicine

## 2022-10-28 ENCOUNTER — Other Ambulatory Visit (HOSPITAL_COMMUNITY): Payer: Self-pay

## 2022-10-28 MED ORDER — CLONAZEPAM 0.5 MG PO TABS
0.2500 mg | ORAL_TABLET | Freq: Every day | ORAL | 1 refills | Status: DC
Start: 2022-10-28 — End: 2023-04-01
  Filled 2022-10-28: qty 20, 20d supply, fill #0
  Filled 2023-02-25: qty 20, 20d supply, fill #1

## 2022-10-29 ENCOUNTER — Encounter: Payer: Self-pay | Admitting: Obstetrics and Gynecology

## 2022-10-29 ENCOUNTER — Ambulatory Visit (INDEPENDENT_AMBULATORY_CARE_PROVIDER_SITE_OTHER): Payer: 59 | Admitting: Obstetrics and Gynecology

## 2022-10-29 VITALS — BP 126/82 | HR 90 | Ht 64.25 in | Wt 221.0 lb

## 2022-10-29 DIAGNOSIS — N951 Menopausal and female climacteric states: Secondary | ICD-10-CM | POA: Diagnosis not present

## 2022-10-29 DIAGNOSIS — Z01419 Encounter for gynecological examination (general) (routine) without abnormal findings: Secondary | ICD-10-CM | POA: Diagnosis not present

## 2022-10-29 NOTE — Patient Instructions (Signed)

## 2022-10-30 LAB — FOLLICLE STIMULATING HORMONE: FSH: 8.5 m[IU]/mL

## 2022-10-30 LAB — ESTRADIOL: Estradiol: 107 pg/mL

## 2022-11-01 ENCOUNTER — Telehealth: Payer: 59 | Admitting: Physician Assistant

## 2022-11-01 DIAGNOSIS — J069 Acute upper respiratory infection, unspecified: Secondary | ICD-10-CM

## 2022-11-01 DIAGNOSIS — B9689 Other specified bacterial agents as the cause of diseases classified elsewhere: Secondary | ICD-10-CM | POA: Diagnosis not present

## 2022-11-01 MED ORDER — AMOXICILLIN-POT CLAVULANATE 875-125 MG PO TABS: 1.0000 | ORAL_TABLET | Freq: Two times a day (BID) | ORAL | 0 refills | Status: DC

## 2022-11-01 NOTE — Progress Notes (Signed)

## 2022-11-25 ENCOUNTER — Other Ambulatory Visit (HOSPITAL_COMMUNITY): Payer: Self-pay

## 2022-11-25 ENCOUNTER — Other Ambulatory Visit: Payer: Self-pay | Admitting: Nurse Practitioner

## 2022-11-25 ENCOUNTER — Encounter (HOSPITAL_COMMUNITY): Payer: Self-pay

## 2022-11-25 DIAGNOSIS — R062 Wheezing: Secondary | ICD-10-CM

## 2022-12-02 ENCOUNTER — Encounter: Payer: Self-pay | Admitting: Primary Care

## 2022-12-02 ENCOUNTER — Other Ambulatory Visit (HOSPITAL_COMMUNITY): Payer: Self-pay

## 2022-12-02 ENCOUNTER — Ambulatory Visit (INDEPENDENT_AMBULATORY_CARE_PROVIDER_SITE_OTHER): Payer: 59 | Admitting: Primary Care

## 2022-12-02 VITALS — BP 128/72 | HR 84 | Temp 97.8°F | Ht 64.5 in | Wt 224.0 lb

## 2022-12-02 DIAGNOSIS — J301 Allergic rhinitis due to pollen: Secondary | ICD-10-CM | POA: Diagnosis not present

## 2022-12-02 DIAGNOSIS — Z8669 Personal history of other diseases of the nervous system and sense organs: Secondary | ICD-10-CM | POA: Diagnosis not present

## 2022-12-02 DIAGNOSIS — G2581 Restless legs syndrome: Secondary | ICD-10-CM

## 2022-12-02 DIAGNOSIS — J4520 Mild intermittent asthma, uncomplicated: Secondary | ICD-10-CM | POA: Diagnosis not present

## 2022-12-02 DIAGNOSIS — R0683 Snoring: Secondary | ICD-10-CM

## 2022-12-02 MED ORDER — AZELASTINE HCL 0.1 % NA SOLN
2.0000 | Freq: Two times a day (BID) | NASAL | 2 refills | Status: DC
  Filled 2022-12-02: qty 30, 30d supply, fill #0

## 2022-12-02 MED ORDER — AIRSUPRA 90-80 MCG/ACT IN AERO
2.0000 | INHALATION_SPRAY | RESPIRATORY_TRACT | 2 refills | Status: AC | PRN
  Filled 2022-12-02: qty 10.7, 30d supply, fill #0
  Filled 2023-08-01: qty 10.7, 30d supply, fill #1
  Filled 2023-11-02: qty 10.7, 30d supply, fill #2

## 2022-12-02 NOTE — Patient Instructions (Addendum)
We will get a in lab split night sleep study (PSG/CPAP titration)  If you are noted to have periodic limb movements with associated arousals we will discuss medications such as Lyrica or gabapentin to help with restless leg symptoms would also recommend checking iron studies  For asthma use Airsupra 2 puffs every 4 hours as needed (do not exceed 12 doses in 24 hours) Continue over-the-counter allergy medication Start astelin nasal spray as directed   Follow-up: 3 months with Waynetta Sandy NP

## 2022-12-02 NOTE — Progress Notes (Signed)
@Patient  ID: Monica Benjamin, female    DOB: [birth year], 42 y.o.   MRN: [id number]  Chief Complaint  Patient presents with   Follow-up    Referring provider: Willow Ora, MD  Discussed the use of AI scribe software for clinical note transcription with the patient, who gave verbal consent to proceed.  HPI: 42 year old female. Past medical history significant for obstructive sleep apnea and asthma.  12/02/2022 Patient presents today for sleep consult.  She has a history of obstructive sleep apnea and asthma. She was last seen by lung and sleep specialist in 2018.  She was on auto CPAP 5 to 15 cm H2O.  Patient reports symptoms of loud snoring, waking up frequently, daytime fatigue and restless leg symptoms. She stopped wearing her CPAP approximately 2 years ago just due to life circumstances.  Patient goes to bed tween 9 and 10:30 PM.  It takes her on average 10 minutes to fall asleep.  She wakes up 3-5 times a night.  She starts her day at 6 AM.  Weight has remained stable.  She does not operate heavy machinery.  She is not currently wearing CPAP or oxygen.  Last sleep study was 8 to 9 years ago. Her old CPAP machine is ResMed air sense 10.  She is no longer on Symbicort. Asthma typically flares seasonally. She uses albuterol as needed prior to exercises or during acute illness.  She is currently getting over a viral URI. Reports having nasal congestion. She has been requiring her albuterol more frequently approximately 2 times per day.  Allergy testing showing multiple sensitivities, and elected not to proceed with allergy shots. No benefit from Singulair in the past.    Allergies  Allergen Reactions   Ace Inhibitors Cough   Nsaids Swelling    Lips and face Other reaction(s): Unknown   Prednisone     Leg cramping    Immunization History  Administered Date(s) Administered   HPV Quadrivalent 10/09/2004, 12/17/2004, 04/29/2005   Influenza, Seasonal, Injecte, Preservative  Fre 10/28/2015   Influenza,inj,Quad PF,6+ Mos 10/28/2015, 10/27/2019   Influenza,inj,quad, With Preservative 10/27/2018   Influenza-Unspecified 10/07/2016, 11/08/2020, 11/12/2021   PFIZER(Purple Top)SARS-COV-2 Vaccination 02/10/2019, 03/03/2019   Tdap 10/12/2006, 10/31/2013    Past Medical History:  Diagnosis Date   Allergy    Anxiety    Asthma    Back pain    Depression    GERD (gastroesophageal reflux disease)    Hyperlipidemia    Hypertension    IBS (irritable bowel syndrome)    Infertility, female 02/15/2017   IVF treatment   Lumbar herniated disc    L4-L5   Migraines    Other fatigue    Shortness of breath on exertion    Sleep apnea    Trigeminal neuralgia    Vitamin D deficiency 01/26/2021    Tobacco History: Social History   Tobacco Use  Smoking Status Never  Smokeless Tobacco Never   Counseling given: Not Answered   Outpatient Medications Prior to Visit  Medication Sig Dispense Refill   albuterol (VENTOLIN HFA) 108 (90 Base) MCG/ACT inhaler Inhale 2 puffs into the lungs every 4 (four) hours as needed (cough, shortness of breath or wheezing.). 18 g 1   amoxicillin-clavulanate (AUGMENTIN) 875-125 MG tablet Take 1 tablet by mouth 2 (two) times daily. 14 tablet 0   cetirizine (ZYRTEC) 10 MG tablet Take 10 mg by mouth daily.     clonazePAM (KLONOPIN) 0.5 MG tablet Take 1/2-1 tablet (0.25-0.5 mg total)  by mouth daily as needed for anxiety 20 tablet 1   fluticasone (FLONASE) 50 MCG/ACT nasal spray Place 2 sprays into both nostrils daily. 16 g 0   hydrochlorothiazide (HYDRODIURIL) 25 MG tablet Take 1 tablet (25 mg total) by mouth daily. 90 tablet 3   hyoscyamine (LEVSIN/SL) 0.125 MG SL tablet Place 1 tablet (0.125 mg total) under the tongue 3 (three) times daily before meals as needed for bloating and cramping 60 tablet 2   Omega-3 Fatty Acids (FISH OIL) 1000 MG CAPS Take 1 capsule by mouth daily.     pantoprazole (PROTONIX) 20 MG tablet Take 1 tablet (20 mg total)  by mouth daily. 90 tablet 3   vortioxetine HBr (TRINTELLIX) 10 MG TABS tablet Take 1 tablet (10 mg total) by mouth daily. 90 tablet 3   No facility-administered medications prior to visit.      Review of Systems  Review of Systems  Constitutional:  Positive for fatigue.  HENT:  Positive for congestion.   Respiratory:  Negative for cough, chest tightness and wheezing.   Cardiovascular: Negative.   Psychiatric/Behavioral:  Positive for sleep disturbance.      Physical Exam  BP 128/72 (BP Location: Right Arm, Patient Position: Sitting, Cuff Size: Normal)   Pulse 84   Temp 97.8 F (36.6 C) (Oral)   Ht 5' 4.5" (1.638 m)   Wt 224 lb (101.6 kg)   SpO2 97%   BMI 37.86 kg/m  Physical Exam Constitutional:      Appearance: Normal appearance. She is obese. She is not ill-appearing.  HENT:     Right Ear: Tympanic membrane normal. There is no impacted cerumen.     Left Ear: Tympanic membrane normal. There is no impacted cerumen.     Mouth/Throat:     Mouth: Mucous membranes are moist.     Pharynx: Oropharynx is clear.  Cardiovascular:     Rate and Rhythm: Normal rate and regular rhythm.  Pulmonary:     Effort: Pulmonary effort is normal.     Breath sounds: Normal breath sounds.     Comments: CTA Musculoskeletal:        General: Normal range of motion.  Skin:    General: Skin is warm and dry.  Neurological:     General: No focal deficit present.     Mental Status: She is alert and oriented to person, place, and time. Mental status is at baseline.  Psychiatric:        Mood and Affect: Mood normal.        Behavior: Behavior normal.        Thought Content: Thought content normal.        Judgment: Judgment normal.      Lab Results:  CBC    Component Value Date/Time   WBC 10.6 (H) 10/24/2022 1618   RBC 4.65 10/24/2022 1618   HGB 14.2 10/24/2022 1618   HGB 14.6 02/12/2021 0847   HGB 13.3 05/24/2015 1547   HCT 41.0 10/24/2022 1618   HCT 42.9 02/12/2021 0847   PLT 326  10/24/2022 1618   PLT 311 02/12/2021 0847   MCV 88.2 10/24/2022 1618   MCV 89 02/12/2021 0847   MCH 30.5 10/24/2022 1618   MCHC 34.6 10/24/2022 1618   RDW 12.8 10/24/2022 1618   RDW 12.9 02/12/2021 0847   LYMPHSABS 1.3 07/27/2022 1342   LYMPHSABS 2.1 02/12/2021 0847   MONOABS 0.7 07/27/2022 1342   EOSABS 0.3 07/27/2022 1342   EOSABS 0.5 (H) 02/12/2021 4098  BASOSABS 0.0 07/27/2022 1342   BASOSABS 0.1 02/12/2021 0847    BMET    Component Value Date/Time   NA 137 10/24/2022 1618   NA 139 02/12/2021 0847   K 3.3 (L) 10/24/2022 1618   CL 99 10/24/2022 1618   CO2 25 10/24/2022 1618   GLUCOSE 113 (H) 10/24/2022 1618   BUN 13 10/24/2022 1618   BUN 6 02/12/2021 0847   CREATININE 0.90 10/24/2022 1618   CREATININE 0.84 11/29/2019 0816   CALCIUM 9.8 10/24/2022 1618   GFRNONAA >60 10/24/2022 1618   GFRNONAA 86 11/29/2019 0816   GFRAA 100 11/29/2019 0816    BNP    Component Value Date/Time   BNP 36.5 10/24/2022 1618    ProBNP No results found for: "PROBNP"  Imaging: No results found.   Assessment & Plan:   1. History of sleep apnea - Split night study; Future  2. Restless leg syndrome - Split night study; Future  3. Loud snoring  4. Mild intermittent asthma without complication  5. Chronic seasonal allergic rhinitis due to pollen   Asthma Well controlled unless sick or exercising. Albuterol used as needed. No longer on Symbicort. -Start Airsupra (budesonide-albuterol), two puffs every four hours as needed (not to exceed twelve puffs in twenty-four hours)  Allergic Rhinitis Current symptoms despite use of Zyrtec and Flonase. -Start Astelin nasal spray. -Advise saline nasal rinses one to two times a day as needed for congestion.  Sleep Apnea History of sleep apnea with previous CPAP use. Currently experiencing symptoms of snoring, fatigue, and restless leg. Epworth 16/24.  No concern for narcolepsy or cataplexy. -Schedule in-lab split night sleep study.      Restless leg symptoms -If she is noted to have periodic limb movements with associated arousals we will discuss medications such as Lyrica or gabapentin to help with restless leg symptoms would also recommend checking iron studies   FU 3 months   Glenford Bayley, NP 12/02/2022

## 2022-12-03 ENCOUNTER — Other Ambulatory Visit (HOSPITAL_COMMUNITY): Payer: Self-pay

## 2023-01-06 ENCOUNTER — Other Ambulatory Visit: Payer: Self-pay | Admitting: Family Medicine

## 2023-01-06 ENCOUNTER — Other Ambulatory Visit (HOSPITAL_COMMUNITY): Payer: Self-pay

## 2023-01-06 MED ORDER — PANTOPRAZOLE SODIUM 20 MG PO TBEC
20.0000 mg | DELAYED_RELEASE_TABLET | Freq: Every day | ORAL | 3 refills | Status: DC
  Filled 2023-01-06: qty 90, 90d supply, fill #0
  Filled 2023-04-08: qty 90, 90d supply, fill #1
  Filled 2023-07-08: qty 90, 90d supply, fill #2
  Filled 2023-09-27 – 2023-09-28 (×2): qty 90, 90d supply, fill #3

## 2023-01-11 DIAGNOSIS — R200 Anesthesia of skin: Secondary | ICD-10-CM | POA: Diagnosis not present

## 2023-01-11 DIAGNOSIS — M5412 Radiculopathy, cervical region: Secondary | ICD-10-CM | POA: Diagnosis not present

## 2023-01-14 ENCOUNTER — Encounter: Payer: Commercial Managed Care - PPO | Admitting: Family Medicine

## 2023-01-15 ENCOUNTER — Ambulatory Visit (HOSPITAL_BASED_OUTPATIENT_CLINIC_OR_DEPARTMENT_OTHER): Payer: 59 | Attending: Primary Care | Admitting: Pulmonary Disease

## 2023-01-15 VITALS — Ht 64.0 in | Wt 220.0 lb

## 2023-01-15 DIAGNOSIS — G2581 Restless legs syndrome: Secondary | ICD-10-CM | POA: Insufficient documentation

## 2023-01-15 DIAGNOSIS — Z8669 Personal history of other diseases of the nervous system and sense organs: Secondary | ICD-10-CM | POA: Diagnosis not present

## 2023-01-16 DIAGNOSIS — G2581 Restless legs syndrome: Secondary | ICD-10-CM

## 2023-01-16 NOTE — Procedures (Signed)
Patient Name: Monica Benjamin, Monica Benjamin Date: 01/15/2023 Gender: Female D.O.B: 06-26-1980 Age (years): 12 Referring Provider: Ames Dura NP Height (inches): 64 Interpreting Physician: Cyril Mourning MD, ABSM Weight (lbs): 220 RPSGT: Lowry Ram BMI: 38 MRN: [id number] Neck Size: 14.00 <br> <br> CLINICAL INFORMATION Sleep Study Type: NPSG    Indication for sleep study: Daytime Fatigue, Depression, Hypertension, Obesity, Snoring    Epworth Sleepiness Score: 10    SLEEP STUDY TECHNIQUE As per the AASM Manual for the Scoring of Sleep and Associated Events v2.3 (April 2016) with a hypopnea requiring 4% desaturations.  The channels recorded and monitored were frontal, central and occipital EEG, electrooculogram (EOG), submentalis EMG (chin), nasal and oral airflow, thoracic and abdominal wall motion, anterior tibialis EMG, snore microphone, electrocardiogram, and pulse oximetry.  MEDICATIONS Medications self-administered by patient taken the night of the study : PROTONIX, Trintellix, ZYRTEC  SLEEP ARCHITECTURE The study was initiated at 10:39:22 PM and ended at 5:00:39 AM.  Sleep onset time was 100.0 minutes and the sleep efficiency was 50.7%. The total sleep time was 193.5 minutes.  Stage REM latency was 146.5 minutes.  The patient spent 13.7% of the night in stage N1 sleep, 64.1% in stage N2 sleep, 19.6% in stage N3 and 2.6% in REM.  Alpha intrusion was absent.  Supine sleep was 44.44%.  RESPIRATORY PARAMETERS The overall apnea/hypopnea index (AHI) was 2.8 per hour. There were 0 total apneas, including 0 obstructive, 0 central and 0 mixed apneas. There were 9 hypopneas and 2 RERAs.  The AHI during Stage REM sleep was 48.0 per hour.  AHI while supine was 4.2 per hour.  The mean oxygen saturation was 92.0%. The minimum SpO2 during sleep was 83.0%.  moderate snoring was noted during this study.  CARDIAC DATA The 2 lead EKG demonstrated sinus rhythm. The mean  heart rate was 59.4 beats per minute. Other EKG findings include: PVCs.   LEG MOVEMENT DATA The total PLMS were 0 with a resulting PLMS index of 0.0. Associated arousal with leg movement index was 8.1 .  IMPRESSIONS - No significant obstructive sleep apnea occurred during this study (AHI = 2.8/h). - Mild oxygen desaturation was noted during this study (Min O2 = 83.0%). - The patient snored with moderate snoring volume. - EKG findings include PVCs. - Clinically significant periodic limb movements did not occur during sleep. Associated arousals were significant.   DIAGNOSIS - No evidence of sleep disordered breathing during this limited study   RECOMMENDATIONS - Reassess clinically whether follow up testing required since this study was limited by low sleep time - Avoid alcohol, sedatives and other CNS depressants that may worsen sleep apnea and disrupt normal sleep architecture. - Sleep hygiene should be reviewed to assess factors that may improve sleep quality. - Weight management and regular exercise should be initiated or continued if appropriate.  Cyril Mourning MD Board Certified in Sleep medicine

## 2023-01-18 ENCOUNTER — Encounter: Payer: Self-pay | Admitting: Family Medicine

## 2023-01-18 ENCOUNTER — Other Ambulatory Visit: Payer: Self-pay | Admitting: Family Medicine

## 2023-01-19 ENCOUNTER — Other Ambulatory Visit (HOSPITAL_COMMUNITY): Payer: Self-pay

## 2023-01-19 MED ORDER — VORTIOXETINE HBR 10 MG PO TABS
10.0000 mg | ORAL_TABLET | Freq: Every day | ORAL | 3 refills | Status: DC
  Filled 2023-01-19: qty 90, 90d supply, fill #0
  Filled 2023-04-16: qty 90, 90d supply, fill #1
  Filled 2023-07-15: qty 90, 90d supply, fill #2
  Filled 2023-10-13: qty 90, 90d supply, fill #3

## 2023-01-24 ENCOUNTER — Telehealth: Payer: 59 | Admitting: Physician Assistant

## 2023-01-24 DIAGNOSIS — J069 Acute upper respiratory infection, unspecified: Secondary | ICD-10-CM | POA: Diagnosis not present

## 2023-01-24 DIAGNOSIS — J4540 Moderate persistent asthma, uncomplicated: Secondary | ICD-10-CM

## 2023-01-24 DIAGNOSIS — R062 Wheezing: Secondary | ICD-10-CM

## 2023-01-24 MED ORDER — ALBUTEROL SULFATE HFA 108 (90 BASE) MCG/ACT IN AERS: 2.0000 | INHALATION_SPRAY | RESPIRATORY_TRACT | 1 refills | Status: AC | PRN

## 2023-01-24 MED ORDER — BENZONATATE 200 MG PO CAPS: 200.0000 mg | ORAL_CAPSULE | Freq: Two times a day (BID) | ORAL | 0 refills | Status: AC | PRN

## 2023-01-24 MED ORDER — DOXYCYCLINE HYCLATE 100 MG PO TABS: 100.0000 mg | ORAL_TABLET | Freq: Two times a day (BID) | ORAL | 0 refills | Status: DC

## 2023-01-24 NOTE — Progress Notes (Signed)
E-Visit for Cough   We are sorry that you are not feeling well.  Here is how we plan to help!  Based on your presentation I believe you most likely have A cough due to bacteria.  When patients have a fever and a productive cough with a change in color or increased sputum production, we are concerned about bacterial bronchitis.  If left untreated it can progress to pneumonia.  If your symptoms do not improve with your treatment plan it is important that you contact your provider.   I have prescribed Doxycycline 100 mg twice a day for 7 days     In addition you may use A prescription cough medication called Tessalon Perles 100mg . You may take 1-2 capsules every 8 hours as needed for your cough.  I have also sent an albuterol inhaler.   From your responses in the eVisit questionnaire you describe inflammation in the upper respiratory tract which is causing a significant cough.  This is commonly called Bronchitis and has four common causes:   Allergies Viral Infections Acid Reflux Bacterial Infection Allergies, viruses and acid reflux are treated by controlling symptoms or eliminating the cause. An example might be a cough caused by taking certain blood pressure medications. You stop the cough by changing the medication. Another example might be a cough caused by acid reflux. Controlling the reflux helps control the cough.  USE OF BRONCHODILATOR ("RESCUE") INHALERS: There is a risk from using your bronchodilator too frequently.  The risk is that over-reliance on a medication which only relaxes the muscles surrounding the breathing tubes can reduce the effectiveness of medications prescribed to reduce swelling and congestion of the tubes themselves.  Although you feel brief relief from the bronchodilator inhaler, your asthma may actually be worsening with the tubes becoming more swollen and filled with mucus.  This can delay other crucial treatments, such as oral steroid medications. If you need to use  a bronchodilator inhaler daily, several times per day, you should discuss this with your provider.  There are probably better treatments that could be used to keep your asthma under control.     HOME CARE Only take medications as instructed by your medical team. Complete the entire course of an antibiotic. Drink plenty of fluids and get plenty of rest. Avoid close contacts especially the very young and the elderly Cover your mouth if you cough or cough into your sleeve. Always remember to wash your hands A steam or ultrasonic humidifier can help congestion.   GET HELP RIGHT AWAY IF: You develop worsening fever. You become short of breath You cough up blood. Your symptoms persist after you have completed your treatment plan MAKE SURE YOU  Understand these instructions. Will watch your condition. Will get help right away if you are not doing well or get worse.    Thank you for choosing an e-visit.  Your e-visit answers were reviewed by a board certified advanced clinical practitioner to complete your personal care plan. Depending upon the condition, your plan could have included both over the counter or prescription medications.  Please review your pharmacy choice. Make sure the pharmacy is open so you can pick up prescription now. If there is a problem, you may contact your provider through [employer] and have the prescription routed to another pharmacy.  Your safety is important to [place]. If you have drug allergies check your prescription carefully.   For the next 24 hours you can use MyChart to ask questions about today's  visit, request a non-urgent call back, or ask for a work or school excuse. You will get an email in the next two days asking about your experience. I hope that your e-visit has been valuable and will speed your recovery.    have provided 5 minutes of non face to face time during this encounter for chart review and documentation.

## 2023-01-25 MED ORDER — AMOXICILLIN-POT CLAVULANATE 875-125 MG PO TABS
1.0000 | ORAL_TABLET | Freq: Two times a day (BID) | ORAL | 0 refills | Status: DC
Start: 2023-01-25 — End: 2023-03-04

## 2023-01-25 NOTE — Addendum Note (Signed)
Addended by: Margaretann Loveless on: 01/25/2023 04:51 PM   Modules accepted: Orders

## 2023-01-26 ENCOUNTER — Encounter: Payer: Self-pay | Admitting: Family Medicine

## 2023-01-26 ENCOUNTER — Ambulatory Visit (INDEPENDENT_AMBULATORY_CARE_PROVIDER_SITE_OTHER): Payer: 59 | Admitting: Family Medicine

## 2023-01-26 ENCOUNTER — Other Ambulatory Visit (HOSPITAL_COMMUNITY): Payer: Self-pay

## 2023-01-26 VITALS — BP 124/96 | HR 74 | Temp 98.2°F | Ht 64.0 in | Wt 217.2 lb

## 2023-01-26 DIAGNOSIS — Z79899 Other long term (current) drug therapy: Secondary | ICD-10-CM | POA: Diagnosis not present

## 2023-01-26 DIAGNOSIS — Z1159 Encounter for screening for other viral diseases: Secondary | ICD-10-CM

## 2023-01-26 DIAGNOSIS — E538 Deficiency of other specified B group vitamins: Secondary | ICD-10-CM | POA: Diagnosis not present

## 2023-01-26 DIAGNOSIS — K760 Fatty (change of) liver, not elsewhere classified: Secondary | ICD-10-CM

## 2023-01-26 DIAGNOSIS — F411 Generalized anxiety disorder: Secondary | ICD-10-CM | POA: Diagnosis not present

## 2023-01-26 DIAGNOSIS — E781 Pure hyperglyceridemia: Secondary | ICD-10-CM

## 2023-01-26 DIAGNOSIS — I10 Essential (primary) hypertension: Secondary | ICD-10-CM

## 2023-01-26 DIAGNOSIS — K582 Mixed irritable bowel syndrome: Secondary | ICD-10-CM

## 2023-01-26 DIAGNOSIS — E559 Vitamin D deficiency, unspecified: Secondary | ICD-10-CM | POA: Diagnosis not present

## 2023-01-26 DIAGNOSIS — Z0001 Encounter for general adult medical examination with abnormal findings: Secondary | ICD-10-CM

## 2023-01-26 DIAGNOSIS — K219 Gastro-esophageal reflux disease without esophagitis: Secondary | ICD-10-CM | POA: Diagnosis not present

## 2023-01-26 LAB — CBC WITH DIFFERENTIAL/PLATELET
Basophils Absolute: 0 10*3/uL (ref 0.0–0.1)
Basophils Relative: 0.5 % (ref 0.0–3.0)
Eosinophils Absolute: 0.4 10*3/uL (ref 0.0–0.7)
Eosinophils Relative: 5.7 % — ABNORMAL HIGH (ref 0.0–5.0)
HCT: 42.8 % (ref 36.0–46.0)
Hemoglobin: 14.5 g/dL (ref 12.0–15.0)
Lymphocytes Relative: 36.9 % (ref 12.0–46.0)
Lymphs Abs: 2.5 10*3/uL (ref 0.7–4.0)
MCHC: 34 g/dL (ref 30.0–36.0)
MCV: 90.9 fL (ref 78.0–100.0)
Monocytes Absolute: 0.6 10*3/uL (ref 0.1–1.0)
Monocytes Relative: 8.8 % (ref 3.0–12.0)
Neutro Abs: 3.3 10*3/uL (ref 1.4–7.7)
Neutrophils Relative %: 48.1 % (ref 43.0–77.0)
Platelets: 231 10*3/uL (ref 150.0–400.0)
RBC: 4.7 Mil/uL (ref 3.87–5.11)
RDW: 13.5 % (ref 11.5–15.5)
WBC: 6.9 10*3/uL (ref 4.0–10.5)

## 2023-01-26 LAB — COMPREHENSIVE METABOLIC PANEL
ALT: 60 U/L — ABNORMAL HIGH (ref 0–35)
AST: 66 U/L — ABNORMAL HIGH (ref 0–37)
Albumin: 4.3 g/dL (ref 3.5–5.2)
Alkaline Phosphatase: 87 U/L (ref 39–117)
BUN: 10 mg/dL (ref 6–23)
CO2: 27 meq/L (ref 19–32)
Calcium: 9.5 mg/dL (ref 8.4–10.5)
Chloride: 100 meq/L (ref 96–112)
Creatinine, Ser: 0.78 mg/dL (ref 0.40–1.20)
GFR: 92.44 mL/min (ref >60.00)
Glucose, Bld: 102 mg/dL — ABNORMAL HIGH (ref 70–99)
Potassium: 3.6 meq/L (ref 3.5–5.1)
Sodium: 139 meq/L (ref 135–145)
Total Bilirubin: 0.5 mg/dL (ref 0.2–1.2)
Total Protein: 7.1 g/dL (ref 6.0–8.3)

## 2023-01-26 LAB — LIPID PANEL
Cholesterol: 200 mg/dL (ref 0–200)
HDL: 42.7 mg/dL (ref >39.00)
LDL Cholesterol: 101 mg/dL — ABNORMAL HIGH (ref 0–99)
NonHDL: 157.7
Total CHOL/HDL Ratio: 5
Triglycerides: 284 mg/dL — ABNORMAL HIGH (ref 0.0–149.0)
VLDL: 56.8 mg/dL — ABNORMAL HIGH (ref 0.0–40.0)

## 2023-01-26 LAB — VITAMIN B12: Vitamin B-12: 284 pg/mL (ref 211–911)

## 2023-01-26 LAB — TSH: TSH: 1.95 u[IU]/mL (ref 0.35–5.50)

## 2023-01-26 LAB — VITAMIN D 25 HYDROXY (VIT D DEFICIENCY, FRACTURES): VITD: 20.46 ng/mL — ABNORMAL LOW (ref 30.00–100.00)

## 2023-01-26 MED ORDER — LOSARTAN POTASSIUM-HCTZ 50-12.5 MG PO TABS
1.0000 | ORAL_TABLET | Freq: Every day | ORAL | 3 refills | Status: DC
Start: 2023-01-26 — End: 2023-11-02
  Filled 2023-01-26: qty 60, 60d supply, fill #0
  Filled 2023-01-26: qty 30, 30d supply, fill #0
  Filled 2023-06-23: qty 90, 90d supply, fill #1
  Filled 2023-09-17: qty 90, 90d supply, fill #2

## 2023-01-26 NOTE — Progress Notes (Signed)
 Subjective  Chief Complaint  Patient presents with   Annual Exam    Pt here for annual Exam and is currently fasting    Anxiety    HPI: Monica Benjamin is a 42 y.o. female who presents to [facility] today for a Female Wellness Visit. She also has the concerns and/or needs as listed above in the chief complaint. These will be addressed in addition to the Health Maintenance Visit.   Wellness Visit: annual visit with health maintenance review and exam  HM: sees Gyn for female wellness. Reviewed notes. Due mammo; last was done prior to breast reduction surgery. Needs to schedule. Doing well overall. Imms up to date Chronic disease f/u and/or acute problem visit: (deemed necessary to be done in addition to the wellness visit): HTN: has been fairly well controlled on hydrochlorothiazide  but mild hypokalemia. No cp or sob. Working on weight loss and healthy diet.  GAD/depression stable on trintellix  and rare klonopin . No AE.  IBS: fortunately is now well managed. Levsin  and eating better has helped considerably. No more significant bouts with abdominal pain or changes in bowel habits.  Didn't tolerate fish oil nor fenofibrate  due to GI sxs due to IBS. Fasting for recheck today.  Chronic gerd is managed with long term use of PPI> well controlled.  Reviewed recent sleep study: negative for sleep apnea. Mild insomnia.       Assessment  1. Encounter for well adult exam with abnormal findings   2. Essential hypertension   3. GAD (generalized anxiety disorder)   4. Gastroesophageal reflux disease without esophagitis   5. NAFLD (nonalcoholic fatty liver disease)   6. Irritable bowel syndrome with both constipation and diarrhea   7. Hypertriglyceridemia   8. Long-term current use of proton pump inhibitor therapy      Plan  Female Wellness Visit: Age appropriate Health Maintenance and Prevention measures were discussed with patient. Included topics are  cancer screening recommendations, ways to keep healthy (see AVS) including dietary and exercise recommendations, regular eye and dental care, use of seat belts, and avoidance of moderate alcohol use and tobacco use. Pt to schedule mammogram.  BMI: discussed patient's BMI and encouraged positive lifestyle modifications to help get to or maintain a target BMI. HM needs and immunizations were addressed and ordered. See below for orders. See HM and immunization section for updates. Routine labs and screening tests ordered including cmp, cbc and lipids where appropriate. Discussed recommendations regarding Vit D and calcium supplementation (see AVS)  Chronic disease management visit and/or acute problem visit: Assessment and Plan  Irritable Bowel Syndrome (IBS) Symptoms well-controlled with as-needed medication. Significant improvement in cramping and bowel habits. Can identify food triggers. - Continue current IBS medication as needed, levsin  and clean diet  Hypertension Blood pressure management with hydrochlorothiazide  effective, but recent labs show low potassium levels. Discussed switching to a combination pill with hydrochlorothiazide  and a potassium-sparing agent (lisinopril  or losartan ). Prefers current medication but open to trying combination pill to avoid low potassium and improve blood pressure control. - Switch to a combination pill with hydrochlorothiazide  and a potassium-sparing agent; stop hydrochlorothiazide  and start hyzaar  50/12.5.  - Monitor blood pressure and potassium levels  Asthma Well-controlled with current inhaler containing a steroid and albuterol . Uses inhaler primarily during sinus infections when reactive airway symptoms are triggered. Reports satisfaction with the new inhaler. - Continue current inhaler as needed, steroid/laba  Insomnia: work on sleep hygiene.   Recheck lipids and  fatty liver   General Health Maintenance Mammogram overdue. Pap smear up to date.  Flu shot received for work. - Order mammogram - Continue routine health maintenance  Follow-up - Order blood work - Follow up on blood work results - Monitor blood pressure and potassium levels.     Follow up: 6 mo for BP recheck  Orders Placed This Encounter  Procedures   TSH   VITAMIN D  25 Hydroxy (Vit-D Deficiency, Fractures)   CBC with Differential/Platelet   Comprehensive metabolic panel   Lipid panel   Vitamin B12   No orders of the defined types were placed in this encounter.     Body mass index is 37.28 kg/m. Wt Readings from Last 3 Encounters:  01/26/23 217 lb 3.2 oz (98.5 kg)  01/15/23 220 lb (99.8 kg)  12/02/22 224 lb (101.6 kg)     Patient Active Problem List   Diagnosis Date Noted Date Diagnosed   Essential hypertension 03/16/2018     Priority: High   History of obstructive sleep apnea 05/25/2016     Priority: High    Overview:  Dr. Javaid Sleep study negative for apnea 12/2022, [facility]    Moderate persistent asthma without complication 11/25/2015     Priority: High    Overview:  Never hospitalized; dxd 2015; Dr. Javaid 2017    [place] (HCC) 10/12/2022     Priority: Medium    NAFLD (nonalcoholic fatty liver disease) 97/98/7976     Priority: Medium     Ultrasound 2015 and 2024    Mild episode of recurrent major depressive disorder (HCC) 05/10/2018     Priority: Medium    Hypertriglyceridemia 05/10/2018     Priority: Medium     Failed fish oil; started fenofibrate  12/2021    Chronic seasonal allergic rhinitis due to pollen 11/25/2015     Priority: Medium    GAD (generalized anxiety disorder) 11/25/2015     Priority: Medium    Gastroesophageal reflux disease without esophagitis 11/25/2015     Priority: Medium    Irritable bowel syndrome with both constipation and diarrhea 11/25/2015     Priority: Medium     Overview:  Negative work up with Dr. Kristie    Vitamin D  deficiency 02/26/2021     Priority: Low   Infertility, female  02/15/2017     Priority: Low    IVF treatment    Perimenopausal symptoms 10/12/2022    Health Maintenance  Topic Date Due   Hepatitis C Screening  Never done   COVID-19 Vaccine (3 - 2024-25 season) 02/11/2023 (Originally 09/27/2022)   INFLUENZA VACCINE  04/26/2023 (Originally 08/27/2022)   DTaP/Tdap/Td (3 - Td or Tdap) 11/01/2023   Cervical Cancer Screening (HPV/Pap Cotest)  04/01/2025   HPV VACCINES  Completed   HIV Screening  Completed   Immunization History  Administered Date(s) Administered   HPV Quadrivalent 10/09/2004, 12/17/2004, 04/29/2005   Influenza, Seasonal, Injecte, Preservative Fre 10/28/2015   Influenza,inj,Quad PF,6+ Mos 10/28/2015, 10/27/2019   Influenza,inj,quad, With Preservative 10/27/2018   Influenza-Unspecified 10/07/2016, 11/08/2020, 11/12/2021   PFIZER(Purple Top)SARS-COV-2 Vaccination 02/10/2019, 03/03/2019   Tdap 10/12/2006, 10/31/2013   We updated and reviewed the patient's past history in detail and it is documented below. Allergies: Patient is allergic to ace inhibitors, nsaids, and prednisone . Past Medical History Patient  has a past medical history of Allergy , Anxiety, Asthma, Back pain, Depression, GERD (gastroesophageal reflux disease), Hyperlipidemia, Hypertension, IBS (irritable bowel syndrome), Infertility, female (02/15/2017), Lumbar herniated disc, Migraines, Other fatigue, Shortness of breath on  exertion, Sleep apnea, Trigeminal neuralgia, and Vitamin D  deficiency (01/26/2021). Past Surgical History Patient  has a past surgical history that includes Wisdom tooth extraction and Breast reduction surgery (Bilateral, 12/31/2021). Family History: Patient family history includes Alcoholism in her father and another family member; Anxiety disorder in her mother and another family member; COPD in her paternal grandmother; Dementia in her maternal grandmother; Diabetes in her maternal grandfather; Hyperlipidemia in her father; Hypertension in her father;  Migraines in her mother and sister; Osteoporosis in her mother. Social History:  Patient  reports that she has never smoked. She has never used smokeless tobacco. She reports that she does not currently use alcohol. She reports that she does not use drugs.  Review of Systems: Constitutional: negative for fever or malaise Ophthalmic: negative for photophobia, double vision or loss of vision Cardiovascular: negative for chest pain, dyspnea on exertion, or new LE swelling Respiratory: negative for SOB or persistent cough Gastrointestinal: negative for abdominal pain, change in bowel habits or melena Genitourinary: negative for dysuria or gross hematuria, no abnormal uterine bleeding or disharge Musculoskeletal: negative for new gait disturbance or muscular weakness Integumentary: negative for new or persistent rashes, no breast lumps Neurological: negative for TIA or stroke symptoms Psychiatric: negative for SI or delusions Allergic/Immunologic: negative for hives  Patient Care Team    Relationship Specialty Notifications Start End  Jodie Lavern CROME, MD PCP - General Family Medicine  07/22/16   Javaid, Adnan, MD Referring Physician Pulmonary Disease  02/15/17   Johnston Seton, MD Consulting Physician Obstetrics and Gynecology  02/15/17   Kristie Lamprey, MD Consulting Physician Gastroenterology  10/12/22     Objective  Vitals: BP (!) 124/96   Pulse 74   Temp 98.2 F (36.8 C)   Ht 5' 4 (1.626 m)   Wt 217 lb 3.2 oz (98.5 kg)   SpO2 94%   BMI 37.28 kg/m  General:  Well developed, well nourished, no acute distress  Psych:  Alert and orientedx3,normal mood and affect HEENT:  Normocephalic, atraumatic, non-icteric sclera,  supple neck without adenopathy, mass or thyromegaly Cardiovascular:  Normal S1, S2, RRR without gallop, rub or murmur Respiratory:  Good breath sounds bilaterally, CTAB with normal respiratory effort Gastrointestinal: normal bowel sounds, soft, non-tender, no noted  masses. No HSM MSK: extremities without edema, joints without erythema or swelling Neurologic:    Mental status is normal.  Gross motor and sensory exams are normal.  No tremor  Commons side effects, risks, benefits, and alternatives for medications and treatment plan prescribed today were discussed, and the patient expressed understanding of the given instructions. Patient is instructed to call or message via MyChart if he/she has any questions or concerns regarding our treatment plan. No barriers to understanding were identified. We discussed Red Flag symptoms and signs in detail. Patient expressed understanding regarding what to do in case of urgent or emergency type symptoms.  Medication list was reconciled, printed and provided to the patient in AVS. Patient instructions and summary information was reviewed with the patient as documented in the AVS. This note was prepared with assistance of Dragon voice recognition software. Occasional wrong-word or sound-a-like substitutions may have occurred due to the inherent limitations of voice recognition software

## 2023-01-26 NOTE — Patient Instructions (Signed)
 Please return in 6 months for hypertension follow up.   I will release your lab results to you on your MyChart account with further instructions. You may see the results before I do, but when I review them I will send you a message with my report or have my assistant call you if things need to be discussed. Please reply to my message with any questions. Thank you!   Please call the office checked below to schedule your appointment for your mammogram and/or bone density screen (the checked studies were ordered): [x]   Mammogram  []   Bone Density  [x]   [facility]     [address] [place], [place]        [phone]         []   [facility]  [address] [place], [place]  [phone]   If you have any questions or concerns, please don't hesitate to send me a message via MyChart or call the office at [phone]. Thank you for visiting with us  today! It's our pleasure caring for you.   VISIT SUMMARY:  During today's visit, we discussed your ongoing fatigue and sleep disturbances, as well as your well-managed IBS and asthma. We also reviewed your blood pressure management and general health maintenance needs.  YOUR PLAN:  -IRRITABLE BOWEL SYNDROME (IBS): IBS is a condition that affects your digestive system, causing symptoms like cramping and changes in bowel habits. Your symptoms are well-controlled with your current medication, so you should continue taking it as needed.  -HYPERTENSION: Hypertension means high blood pressure. Your current medication is working, but it has caused low potassium levels. We discussed switching to a combination pill that includes a potassium-sparing agent losartan  to help manage your blood pressure and potassium levels better. Please start the new medication and monitor your blood pressure and potassium levels.   -ASTHMA: Asthma is a condition that affects your airways and makes it hard to breathe. Your asthma is  well-controlled with your current inhaler, which you use mainly during sinus infections. Continue using your inhaler as needed.  -GENERAL HEALTH MAINTENANCE: You are up to date with your Pap smear and have received your flu shot. However, your mammogram is overdue, so we will order one for you. Continue with your routine health maintenance.  INSTRUCTIONS:  Please follow up on your blood work results and monitor your blood pressure and potassium levels. We will also order a mammogram for you.

## 2023-01-27 LAB — HEPATITIS C ANTIBODY: Hepatitis C Ab: NONREACTIVE

## 2023-01-30 ENCOUNTER — Ambulatory Visit
Admission: EM | Admit: 2023-01-30 | Discharge: 2023-01-30 | Disposition: A | Discharge status: Home / Self Care | Payer: 59 | Attending: Family Medicine | Admitting: Family Medicine

## 2023-01-30 ENCOUNTER — Other Ambulatory Visit: Payer: Self-pay

## 2023-01-30 ENCOUNTER — Ambulatory Visit (INDEPENDENT_AMBULATORY_CARE_PROVIDER_SITE_OTHER): Payer: 59

## 2023-01-30 DIAGNOSIS — J4540 Moderate persistent asthma, uncomplicated: Secondary | ICD-10-CM

## 2023-01-30 DIAGNOSIS — R059 Cough, unspecified: Secondary | ICD-10-CM | POA: Diagnosis not present

## 2023-01-30 DIAGNOSIS — R0989 Other specified symptoms and signs involving the circulatory and respiratory systems: Secondary | ICD-10-CM

## 2023-01-30 DIAGNOSIS — U071 COVID-19: Secondary | ICD-10-CM | POA: Diagnosis not present

## 2023-01-30 DIAGNOSIS — E538 Deficiency of other specified B group vitamins: Secondary | ICD-10-CM | POA: Insufficient documentation

## 2023-01-30 MED ORDER — METHYLPREDNISOLONE ACETATE 80 MG/ML IJ SUSP
80.0000 mg | Freq: Once | INTRAMUSCULAR | Status: AC
  Administered 2023-01-30: 80 mg via INTRAMUSCULAR

## 2023-01-30 MED ORDER — VITAMIN D (ERGOCALCIFEROL) 1.25 MG (50000 UNIT) PO CAPS
50000.0000 [IU] | ORAL_CAPSULE | ORAL | 0 refills | Status: AC
  Filled 2023-01-30: qty 12, 84d supply, fill #0

## 2023-01-30 NOTE — ED Provider Notes (Signed)
 [facility]  Note:  This document was prepared using Conservation officer, historic buildings and may include unintentional dictation errors.  MRN: [id number] DOB: August 16, 1980  Subjective:   Monica Benjamin is a 43 y.o. female presenting for 3-week history of persistent sinus congestion, chest congestion, productive cough.  Patient was diagnosed with COVID-19 01/12/2023.  She just had an e visit and was prescribed Augmentin .  Has continued to cough.  Does not need a refill on her inhalers.  No smoking of any kind including cigarettes, cigars, vaping, marijuana use.  Is requesting a steroid injection.  No current facility-administered medications for this encounter.  Current Outpatient Medications:    albuterol  (VENTOLIN  HFA) 108 (90 Base) MCG/ACT inhaler, Inhale 2 puffs into the lungs every 4 (four) hours as needed (cough, shortness of breath or wheezing.)., Disp: 18 g, Rfl: 1   Albuterol -Budesonide  (AIRSUPRA ) 90-80 MCG/ACT AERO, Inhale 2 puffs into the lungs every 4 (four) hours as needed (Asthma symptoms)., Disp: 10.7 g, Rfl: 2   amoxicillin -clavulanate (AUGMENTIN ) 875-125 MG tablet, Take 1 tablet by mouth 2 (two) times daily., Disp: 20 tablet, Rfl: 0   azelastine  (ASTELIN ) 0.1 % nasal spray, Place 2 sprays into both nostrils 2 (two) times daily as directed, Disp: 30 mL, Rfl: 2   benzonatate  (TESSALON ) 200 MG capsule, Take 1 capsule (200 mg total) by mouth 2 (two) times daily as needed for up to 10 days for cough., Disp: 20 capsule, Rfl: 0   cetirizine (ZYRTEC) 10 MG tablet, Take 10 mg by mouth daily., Disp: , Rfl:    clonazePAM  (KLONOPIN ) 0.5 MG tablet, Take 1/2-1 tablet (0.25-0.5 mg total) by mouth daily as needed for anxiety, Disp: 20 tablet, Rfl: 1   fluticasone  (FLONASE ) 50 MCG/ACT nasal spray, Place 2 sprays into both nostrils daily., Disp: 16 g, Rfl: 0   hyoscyamine  (LEVSIN /SL) 0.125 MG SL tablet, Place 1 tablet (0.125 mg total) under the tongue 3 (three) times  daily before meals as needed for bloating and cramping, Disp: 60 tablet, Rfl: 2   losartan -hydrochlorothiazide  (HYZAAR ) 50-12.5 MG tablet, Take 1 tablet by mouth daily., Disp: 90 tablet, Rfl: 3   Omega-3 Fatty Acids (FISH OIL) 1000 MG CAPS, Take 1 capsule by mouth daily., Disp: , Rfl:    pantoprazole  (PROTONIX ) 20 MG tablet, Take 1 tablet (20 mg total) by mouth daily., Disp: 90 tablet, Rfl: 3   Vitamin D , Ergocalciferol , (DRISDOL ) 1.25 MG (50000 UNIT) CAPS capsule, Take 1 capsule (50,000 Units total) by mouth once a week., Disp: 12 capsule, Rfl: 0   vortioxetine  HBr (TRINTELLIX ) 10 MG TABS tablet, Take 1 tablet (10 mg total) by mouth daily., Disp: 90 tablet, Rfl: 3   Allergies  Allergen Reactions   Ace Inhibitors Cough   Nsaids Swelling    Lips and face Other reaction(s): Unknown   Prednisone      Leg cramping with oral only    Past Medical History:  Diagnosis Date   Allergy     Anxiety    Asthma    Back pain    Depression    GERD (gastroesophageal reflux disease)    Hyperlipidemia    Hypertension    IBS (irritable bowel syndrome)    Infertility, female 02/15/2017   IVF treatment   Lumbar herniated disc    L4-L5   Migraines    Other fatigue    Shortness of breath on exertion    Sleep apnea    Trigeminal neuralgia    Vitamin D  deficiency 01/26/2021  Past Surgical History:  Procedure Laterality Date   BREAST REDUCTION SURGERY Bilateral 12/31/2021   Procedure: BREAST REDUCTION WITH LIPOSUCTION;  Surgeon: Lowery Estefana RAMAN, DO;  Location: [facility];  Service: Plastics;  Laterality: Bilateral;   WISDOM TOOTH EXTRACTION      Family History  Problem Relation Age of Onset   Anxiety disorder Mother    Osteoporosis Mother    Migraines Mother    Alcoholism Father    Hyperlipidemia Father    Hypertension Father    Migraines Sister    Dementia Maternal Grandmother    Diabetes Maternal Grandfather    COPD Paternal Grandmother    Anxiety disorder Other     Alcoholism Other    Cancer Neg Hx    Heart disease Neg Hx     Social History   Tobacco Use   Smoking status: Never   Smokeless tobacco: Never  Vaping Use   Vaping status: Never Used  Substance Use Topics   Alcohol use: Not Currently   Drug use: No    ROS   Objective:   Vitals: BP 128/83 (BP Location: Right Arm)   Pulse 76   Temp 98.7 F (37.1 C) (Oral)   Resp 20   LMP 01/08/2023   SpO2 97%   Physical Exam Constitutional:      General: She is not in acute distress.    Appearance: Normal appearance. She is well-developed. She is not ill-appearing, toxic-appearing or diaphoretic.  HENT:     Head: Normocephalic and atraumatic.     Nose: Congestion present. No rhinorrhea.     Mouth/Throat:     Mouth: Mucous membranes are moist.  Eyes:     General: No scleral icterus.       Right eye: No discharge.        Left eye: No discharge.     Extraocular Movements: Extraocular movements intact.  Cardiovascular:     Rate and Rhythm: Normal rate and regular rhythm.     Heart sounds: Normal heart sounds. No murmur heard.    No friction rub. No gallop.  Pulmonary:     Effort: Pulmonary effort is normal. No respiratory distress.     Breath sounds: No stridor. Wheezing and rhonchi present. No rales.  Chest:     Chest wall: No tenderness.  Skin:    General: Skin is warm and dry.  Neurological:     General: No focal deficit present.     Mental Status: She is alert and oriented to person, place, and time.  Psychiatric:        Mood and Affect: Mood normal.        Behavior: Behavior normal.     Assessment and Plan :   PDMP not reviewed this encounter.  1. COVID-19   2. Chest congestion   3. Moderate persistent asthma, uncomplicated    X-ray over-read was pending at time of discharge, recommended follow up with only abnormal results. Otherwise will not call for negative over-read. Patient was in agreement.  IM Depo-Medrol  80 mg administered in clinic.  Continue with  supportive care for her COVID-19 infection.  Suspect this is worsened by her underlying asthma.  No refills needed on her inhaler.  Finish out Augmentin .  Counseled patient on potential for adverse effects with medications prescribed/recommended today, ER and return-to-clinic precautions discussed, patient verbalized understanding.    Christopher Savannah, PA-C 01/30/23 1210

## 2023-01-30 NOTE — Addendum Note (Signed)
 Addended by: Asencion Partridge on: 01/30/2023 09:05 AM   Modules accepted: Orders

## 2023-01-30 NOTE — Progress Notes (Signed)
 See mychart note Dear Monica Benjamin, It was so good seeing you and [name]! I have reviewed your results. Good news, your fasting triglycerides, although remain elevated, are not in the dangerously high range as they were before. Continue to try to eat a low fat diet, avoid fried foods, and consume fish on a regular basis to keep these down.  Your liver tests are mildly elevated again. Your vitamin D  and B12 levels are both low; this can contribute to fatigue so please start the high dose weekly supplement that I have ordered for you; take this for [redacted] weeks along with otc Vitd 2000 units daily. Also add a daily otc Vitamin B12 1000mcg daily.  Let me know if you have any problems with the new blood pressure medication and I will recheck everything in 6 months.  Happy new year.  Sincerely, Dr. Jodie

## 2023-01-30 NOTE — ED Triage Notes (Signed)
 Pt c/o cough, head/chest congestion since having covid 12/17-states had evisit on day of augmentin-c/o cough cont'd-NAD-steady gait

## 2023-01-31 ENCOUNTER — Encounter: Payer: Self-pay | Admitting: Family Medicine

## 2023-02-01 ENCOUNTER — Other Ambulatory Visit (HOSPITAL_COMMUNITY): Payer: Self-pay

## 2023-02-01 MED ORDER — FLUCONAZOLE 150 MG PO TABS: ORAL_TABLET | ORAL | 0 refills | Status: DC

## 2023-02-03 ENCOUNTER — Other Ambulatory Visit: Payer: Self-pay

## 2023-02-03 ENCOUNTER — Emergency Department (HOSPITAL_BASED_OUTPATIENT_CLINIC_OR_DEPARTMENT_OTHER)
Admission: EM | Admit: 2023-02-03 | Discharge: 2023-02-03 | Disposition: A | Discharge status: Home / Self Care | Payer: 59 | Attending: Emergency Medicine | Admitting: Emergency Medicine

## 2023-02-03 ENCOUNTER — Emergency Department (HOSPITAL_BASED_OUTPATIENT_CLINIC_OR_DEPARTMENT_OTHER): Payer: 59

## 2023-02-03 ENCOUNTER — Encounter (HOSPITAL_BASED_OUTPATIENT_CLINIC_OR_DEPARTMENT_OTHER): Payer: Self-pay | Admitting: Emergency Medicine

## 2023-02-03 DIAGNOSIS — J209 Acute bronchitis, unspecified: Secondary | ICD-10-CM | POA: Diagnosis not present

## 2023-02-03 DIAGNOSIS — R0602 Shortness of breath: Secondary | ICD-10-CM | POA: Diagnosis not present

## 2023-02-03 DIAGNOSIS — I10 Essential (primary) hypertension: Secondary | ICD-10-CM | POA: Insufficient documentation

## 2023-02-03 DIAGNOSIS — Z79899 Other long term (current) drug therapy: Secondary | ICD-10-CM | POA: Insufficient documentation

## 2023-02-03 DIAGNOSIS — R110 Nausea: Secondary | ICD-10-CM | POA: Insufficient documentation

## 2023-02-03 DIAGNOSIS — J45909 Unspecified asthma, uncomplicated: Secondary | ICD-10-CM | POA: Diagnosis not present

## 2023-02-03 DIAGNOSIS — R059 Cough, unspecified: Secondary | ICD-10-CM | POA: Diagnosis not present

## 2023-02-03 MED ORDER — AZITHROMYCIN 250 MG PO TABS
500.0000 mg | ORAL_TABLET | Freq: Once | ORAL | Status: AC
  Administered 2023-02-03: 500 mg via ORAL
  Filled 2023-02-03: qty 2

## 2023-02-03 MED ORDER — IPRATROPIUM-ALBUTEROL 0.5-2.5 (3) MG/3ML IN SOLN
3.0000 mL | Freq: Once | RESPIRATORY_TRACT | Status: AC
  Administered 2023-02-03: 3 mL via RESPIRATORY_TRACT
  Filled 2023-02-03: qty 3

## 2023-02-03 MED ORDER — DEXAMETHASONE SODIUM PHOSPHATE 10 MG/ML IJ SOLN
10.0000 mg | Freq: Once | INTRAMUSCULAR | Status: AC
  Administered 2023-02-03: 10 mg via INTRAMUSCULAR
  Filled 2023-02-03: qty 1

## 2023-02-03 MED ORDER — GUAIFENESIN-CODEINE 100-10 MG/5ML PO SOLN
10.0000 mL | Freq: Once | ORAL | Status: AC
  Administered 2023-02-03: 10 mL via ORAL
  Filled 2023-02-03: qty 10

## 2023-02-03 MED ORDER — AZITHROMYCIN 250 MG PO TABS: 250.0000 mg | ORAL_TABLET | Freq: Every day | ORAL | 0 refills | Status: DC

## 2023-02-03 MED ORDER — GUAIFENESIN-CODEINE 100-10 MG/5ML PO SOLN
10.0000 mL | Freq: Four times a day (QID) | ORAL | 0 refills | Status: DC | PRN
Start: 2023-02-03 — End: 2023-03-04

## 2023-02-03 NOTE — ED Triage Notes (Signed)
 Pt c/o SHOB and cough with nausea since 12/30. Has been seen at [facility] and E-Visit, was prescribed Augmentin and a steroid injection. Covid + 12/16.

## 2023-02-03 NOTE — ED Provider Notes (Signed)
 [facility] AT [facility] Provider Note   CSN: [id number] Arrival date & time: 02/03/23  0429     History  Chief Complaint  Patient presents with   Shortness of Breath    Monica Benjamin is a 43 y.o. female.  Patient is a 43 year old female with past medical history of asthma, obstructive sleep apnea, hypertension, anxiety, irritable bowel.  Patient presenting today for evaluation of cough, congestion, shortness of breath, and nausea that has been ongoing for the past 10 days.  She was diagnosed with COVID in mid December but seemed to improve, then cough and URI symptoms have returned.  She was recently seen by her doctor and in the ER.  She has received a steroid injection, breathing treatments, antibiotics, but feels as though nothing is helping her.  She continues to have cough and feels poorly.  The history is provided by the patient.       Home Medications Prior to Admission medications   Medication Sig Start Date End Date Taking? Authorizing Provider  albuterol  (VENTOLIN  HFA) 108 (90 Base) MCG/ACT inhaler Inhale 2 puffs into the lungs every 4 (four) hours as needed (cough, shortness of breath or wheezing.). 01/24/23   Blair, Diane W, FNP  Albuterol -Budesonide  (AIRSUPRA ) 90-80 MCG/ACT AERO Inhale 2 puffs into the lungs every 4 (four) hours as needed (Asthma symptoms). 12/02/22   Hope Almarie ORN, NP  amoxicillin -clavulanate (AUGMENTIN ) 875-125 MG tablet Take 1 tablet by mouth 2 (two) times daily. 01/25/23   Vivienne Delon HERO, PA-C  azelastine  (ASTELIN ) 0.1 % nasal spray Place 2 sprays into both nostrils 2 (two) times daily as directed 12/02/22   Hope Almarie ORN, NP  benzonatate  (TESSALON ) 200 MG capsule Take 1 capsule (200 mg total) by mouth 2 (two) times daily as needed for up to 10 days for cough. 01/24/23 02/03/23  Blair, Diane W, FNP  cetirizine (ZYRTEC) 10 MG tablet Take 10 mg by mouth daily.    [provider]  clonazePAM   (KLONOPIN ) 0.5 MG tablet Take 1/2-1 tablet (0.25-0.5 mg total) by mouth daily as needed for anxiety 10/28/22   Jodie Lavern CROME, MD  fluconazole  (DIFLUCAN ) 150 MG tablet Take one tablet today; may repeat in 3 days if symptoms persist 02/01/23   Jodie Lavern CROME, MD  fluticasone  (FLONASE ) 50 MCG/ACT nasal spray Place 2 sprays into both nostrils daily. 08/17/22   Jodie Lavern CROME, MD  hyoscyamine  (LEVSIN AMIEL) 0.125 MG SL tablet Place 1 tablet (0.125 mg total) under the tongue 3 (three) times daily before meals as needed for bloating and cramping 10/12/22   Jodie Lavern CROME, MD  losartan -hydrochlorothiazide  (HYZAAR ) 50-12.5 MG tablet Take 1 tablet by mouth daily. 01/26/23   Jodie Lavern CROME, MD  Omega-3 Fatty Acids (FISH OIL) 1000 MG CAPS Take 1 capsule by mouth daily.    [provider]  pantoprazole  (PROTONIX ) 20 MG tablet Take 1 tablet (20 mg total) by mouth daily. 01/06/23   Jodie Lavern CROME, MD  Vitamin D , Ergocalciferol , (DRISDOL ) 1.25 MG (50000 UNIT) CAPS capsule Take 1 capsule (50,000 Units total) by mouth once a week. 01/30/23 04/30/23  Jodie Lavern CROME, MD  vortioxetine  HBr (TRINTELLIX ) 10 MG TABS tablet Take 1 tablet (10 mg total) by mouth daily. 01/19/23   Katrinka Garnette KIDD, MD      Allergies    Ace inhibitors, Nsaids, and Prednisone     Review of Systems   Review of Systems  All other systems reviewed and are negative.   Physical  Exam Updated Vital Signs BP (!) 123/90   Pulse 79   Temp 98.5 F (36.9 C)   Resp 18   Ht 5' 4 (1.626 m)   Wt 98.4 kg   LMP 01/08/2023   SpO2 90%   BMI 37.25 kg/m  Physical Exam Vitals and nursing note reviewed.  Constitutional:      General: She is not in acute distress.    Appearance: She is well-developed. She is not diaphoretic.  HENT:     Head: Normocephalic and atraumatic.  Cardiovascular:     Rate and Rhythm: Normal rate and regular rhythm.     Heart sounds: No murmur heard.    No friction rub. No gallop.  Pulmonary:     Effort: Pulmonary  effort is normal. No respiratory distress.     Breath sounds: Examination of the right-middle field reveals wheezing. Examination of the left-middle field reveals wheezing. Wheezing present.     Comments: There are slight wheezes bilaterally. Abdominal:     General: Bowel sounds are normal. There is no distension.     Palpations: Abdomen is soft.     Tenderness: There is no abdominal tenderness.  Musculoskeletal:        General: Normal range of motion.     Cervical back: Normal range of motion and neck supple.  Skin:    General: Skin is warm and dry.  Neurological:     General: No focal deficit present.     Mental Status: She is alert and oriented to person, place, and time.     ED Results / Procedures / Treatments   Labs (all labs ordered are listed, but only abnormal results are displayed) Labs Reviewed - No data to display  EKG EKG Interpretation Date/Time:  Wednesday February 03 2023 04:42:10 EST Ventricular Rate:  87 PR Interval:  156 QRS Duration:  80 QT Interval:  370 QTC Calculation: 445 R Axis:   87  Text Interpretation: Normal sinus rhythm Nonspecific T wave abnormality Abnormal ECG When compared with ECG of 24-Oct-2022 16:13, no significant change is noted Confirmed by Geroldine Berg ([id number]) on 02/03/2023 4:45:22 AM  Radiology DG Chest Port 1 View Result Date: 02/03/2023 CLINICAL DATA:  [id number] with shortness of breath and cough with nausea. EXAM: PORTABLE CHEST 1 VIEW COMPARISON:  PA Lat chest 01/30/2023 FINDINGS: The heart size and mediastinal contours are within normal limits. Both lungs are clear. The visualized skeletal structures are unremarkable. IMPRESSION: No evidence of acute chest disease or interval changes. Electronically Signed   By: Francis Quam M.D.   On: 02/03/2023 06:02    Procedures Procedures    Medications Ordered in ED Medications  ipratropium-albuterol  (DUONEB) 0.5-2.5 (3) MG/3ML nebulizer solution 3 mL (has no administration in time range)   ipratropium-albuterol  (DUONEB) 0.5-2.5 (3) MG/3ML nebulizer solution 3 mL (has no administration in time range)  dexamethasone  (DECADRON ) injection 10 mg (has no administration in time range)  guaiFENesin -codeine  100-10 MG/5ML solution 10 mL (has no administration in time range)  azithromycin  (ZITHROMAX ) tablet 500 mg (has no administration in time range)    ED Course/ Medical Decision Making/ A&P  Patient is a 43 year old female with medical history as per HPI presenting with complaints of difficulty breathing, cough, and congestion that has been ongoing for many weeks.  She was initially diagnosed with COVID, then treated with antibiotics for presumed bronchitis, but none of this seems to be helping her.  Patient arrives here today with stable vital signs and is afebrile.  Physical  examination basically unremarkable.  Oxygen saturations in the low to mid 90s.  Chest x-ray shows no acute process.  Patient has received 2 DuoNebs along with IM Decadron .  Patient will be discharged with an additional course of an antibiotic to see if this helps.  She is to continue her breathing treatments at home.  Final Clinical Impression(s) / ED Diagnoses Final diagnoses:  None    Rx / DC Orders ED Discharge Orders     None         Geroldine Berg, MD 02/03/23 [phone]

## 2023-02-03 NOTE — Discharge Instructions (Signed)
 Begin taking Zithromax as prescribed.  Begin taking Robitussin with codeine as prescribed as needed for cough.  Follow-up with primary doctor if not improving.

## 2023-02-26 ENCOUNTER — Other Ambulatory Visit: Payer: Self-pay

## 2023-03-04 ENCOUNTER — Ambulatory Visit: Payer: 59 | Admitting: Primary Care

## 2023-03-04 ENCOUNTER — Encounter: Payer: Self-pay | Admitting: Primary Care

## 2023-03-04 ENCOUNTER — Other Ambulatory Visit (HOSPITAL_COMMUNITY): Payer: Self-pay

## 2023-03-04 VITALS — BP 129/89 | HR 86 | Temp 97.3°F | Ht 64.0 in | Wt 220.8 lb

## 2023-03-04 DIAGNOSIS — R0683 Snoring: Secondary | ICD-10-CM

## 2023-03-04 DIAGNOSIS — Z8669 Personal history of other diseases of the nervous system and sense organs: Secondary | ICD-10-CM | POA: Diagnosis not present

## 2023-03-04 MED ORDER — CHLORPHENIRAMINE MALEATE 4 MG PO TABS
4.0000 mg | ORAL_TABLET | Freq: Four times a day (QID) | ORAL | 1 refills | Status: DC | PRN
  Filled 2023-03-04 – 2023-03-24 (×4): qty 30, 8d supply, fill #0

## 2023-03-04 MED ORDER — IPRATROPIUM BROMIDE 0.03 % NA SOLN
2.0000 | Freq: Two times a day (BID) | NASAL | 1 refills | Status: AC
  Filled 2023-03-04: qty 30, 30d supply, fill #0
  Filled 2023-03-23: qty 30, 43d supply, fill #0
  Filled 2023-09-27 – 2023-09-28 (×2): qty 30, 43d supply, fill #1

## 2023-03-04 NOTE — Progress Notes (Signed)
 @Patient  ID: Monica Benjamin, female    DOB: 1980/11/06, 43 y.o.   MRN: [id number]  No chief complaint on file.   Referring provider: Jodie Lavern CROME, MD  HPI: 43 year old female. Past medical history significant for obstructive sleep apnea and asthma.  Previous [facility] pulmonary encounter: 12/02/2022 Patient presents today for sleep consult.  She has a history of obstructive sleep apnea and asthma. She was last seen by lung and sleep specialist in 2018.  She was on auto CPAP 5 to 15 cm H2O.  Patient reports symptoms of loud snoring, waking up frequently, daytime fatigue and restless leg symptoms. She stopped wearing her CPAP approximately 2 years ago just due to life circumstances.  Patient goes to bed tween 9 and 10:30 PM.  It takes her on average 10 minutes to fall asleep.  She wakes up 3-5 times a night.  She starts her day at 6 AM.  Weight has remained stable.  She does not operate heavy machinery.  She is not currently wearing CPAP or oxygen.  Last sleep study was 8 to 9 years ago. Her old CPAP machine is ResMed air sense 10.  She is no longer on Symbicort . Asthma typically flares seasonally. She uses albuterol  as needed prior to exercises or during acute illness.  She is currently getting over a viral URI. Reports having nasal congestion. She has been requiring her albuterol  more frequently approximately 2 times per day.  Allergy  testing showing multiple sensitivities, and elected not to proceed with allergy  shots. No benefit from Singulair in the past.    Asthma Well controlled unless sick or exercising. Albuterol  used as needed. No longer on Symbicort . -Start Airsupra  (budesonide -albuterol ), two puffs every four hours as needed (not to exceed twelve puffs in twenty-four hours)  Allergic Rhinitis Current symptoms despite use of Zyrtec and Flonase . -Start Astelin  nasal spray. -Advise saline nasal rinses one to two times a day as needed for congestion.  Sleep Apnea History of  sleep apnea with previous CPAP use. Currently experiencing symptoms of snoring, fatigue, and restless leg. Epworth 16/24.  No concern for narcolepsy or cataplexy. -Schedule in-lab split night sleep study.     Restless leg symptoms -If she is noted to have periodic limb movements with associated arousals we will discuss medications such as Lyrica or gabapentin to help with restless leg symptoms would also recommend checking iron studies   03/04/2023- Interim hx Discussed the use of AI scribe software for clinical note transcription with the patient, who gave verbal consent to proceed.  Patient presents today for 3 month follow-up.  Patient has a history of asthma and sleep apnea.  She underwent sleeps study in lab on 01/15/2023 that showed no significant obstructive sleep apnea, AHI 2.8/h with mild oxygen desaturation with SpO2 low 83%.  Patient snored with moderate volume. Reassess clinically whether follow-up sleep testing required since sleep study was limited by sleep time. She experiences symptoms of snoring, fatigue, and restless legs, and sometimes wakes up feeling breathless or dizzy. She sleeps on her left side and has trouble staying asleep.  Patient was seen in urgent care at [facility] on January 8 for cough, congestion, shortness of breath and nausea for 10 days.  Patient was diagnosed with COVID in mid December, treated with antibiotics for presumed bronchitis without significant improvement.  Chest x-ray showed no acute process.  Patient received 2 DuoNeb's along with IM Decadron .  Patient was discharged with additional course of antibiotics (Augmentin ) and instructed to continue  breathing treatments at home.  She is maintained on Airsupra  2 puffs every 4 hours as needed for asthma symptoms. Despite this, she continues to experience residual coughing and wheezing. Prior to the COVID-19 infection, her asthma was well-controlled. She favors Airspura, using on average once a day, over Symbicort .  She dislikes the taste of Astelin  nasal spray and is using Zyrtec for allergy  management.  She reports low vitamin D  and B12 levels from a physical exam conducted in late December, contributing to her fatigue. Additionally, she mentions neck pain, which was evaluated by an orthopedic specialist and attributed to mild arthritis.      Allergies  Allergen Reactions   Ace Inhibitors Cough   Nsaids Swelling    Lips and face Other reaction(s): Unknown   Prednisone      Leg cramping with oral only    Immunization History  Administered Date(s) Administered   HPV Quadrivalent 10/09/2004, 12/17/2004, 04/29/2005   Influenza, Seasonal, Injecte, Preservative Fre 10/28/2015   Influenza,inj,Quad PF,6+ Mos 10/28/2015, 10/27/2019   Influenza,inj,quad, With Preservative 10/27/2018   Influenza-Unspecified 10/07/2016, 11/08/2020, 11/12/2021   PFIZER(Purple Top)SARS-COV-2 Vaccination 02/10/2019, 03/03/2019   Tdap 10/12/2006, 10/31/2013    Past Medical History:  Diagnosis Date   Allergy     Anxiety    Asthma    Back pain    Depression    GERD (gastroesophageal reflux disease)    Hyperlipidemia    Hypertension    IBS (irritable bowel syndrome)    Infertility, female 02/15/2017   IVF treatment   Lumbar herniated disc    L4-L5   Migraines    Other fatigue    Shortness of breath on exertion    Sleep apnea    Trigeminal neuralgia    Vitamin D  deficiency 01/26/2021    Tobacco History: Social History   Tobacco Use  Smoking Status Never  Smokeless Tobacco Never   Counseling given: Not Answered   Outpatient Medications Prior to Visit  Medication Sig Dispense Refill   albuterol  (VENTOLIN  HFA) 108 (90 Base) MCG/ACT inhaler Inhale 2 puffs into the lungs every 4 (four) hours as needed (cough, shortness of breath or wheezing.). 18 g 1   Albuterol -Budesonide  (AIRSUPRA ) 90-80 MCG/ACT AERO Inhale 2 puffs into the lungs every 4 (four) hours as needed (Asthma symptoms). 10.7 g 2    amoxicillin -clavulanate (AUGMENTIN ) 875-125 MG tablet Take 1 tablet by mouth 2 (two) times daily. 20 tablet 0   azelastine  (ASTELIN ) 0.1 % nasal spray Place 2 sprays into both nostrils 2 (two) times daily as directed 30 mL 2   azithromycin  (ZITHROMAX ) 250 MG tablet Take 1 tablet (250 mg total) by mouth daily. 4 tablet 0   cetirizine (ZYRTEC) 10 MG tablet Take 10 mg by mouth daily.     clonazePAM  (KLONOPIN ) 0.5 MG tablet Take 1/2-1 tablet (0.25-0.5 mg total) by mouth daily as needed for anxiety 20 tablet 1   fluconazole  (DIFLUCAN ) 150 MG tablet Take one tablet today; may repeat in 3 days if symptoms persist 2 tablet 0   fluticasone  (FLONASE ) 50 MCG/ACT nasal spray Place 2 sprays into both nostrils daily. 16 g 0   guaiFENesin -codeine  100-10 MG/5ML syrup Take 10 mLs by mouth every 6 (six) hours as needed for cough. 120 mL 0   hyoscyamine  (LEVSIN /SL) 0.125 MG SL tablet Place 1 tablet (0.125 mg total) under the tongue 3 (three) times daily before meals as needed for bloating and cramping 60 tablet 2   losartan -hydrochlorothiazide  (HYZAAR ) 50-12.5 MG tablet Take 1 tablet by mouth daily.  90 tablet 3   Omega-3 Fatty Acids (FISH OIL) 1000 MG CAPS Take 1 capsule by mouth daily.     pantoprazole  (PROTONIX ) 20 MG tablet Take 1 tablet (20 mg total) by mouth daily. 90 tablet 3   Vitamin D , Ergocalciferol , (DRISDOL ) 1.25 MG (50000 UNIT) CAPS capsule Take 1 capsule (50,000 Units total) by mouth once a week. 12 capsule 0   vortioxetine  HBr (TRINTELLIX ) 10 MG TABS tablet Take 1 tablet (10 mg total) by mouth daily. 90 tablet 3   No facility-administered medications prior to visit.      Review of Systems  Review of Systems  Constitutional: Negative.   HENT:  Positive for congestion.   Respiratory:  Positive for cough and wheezing.   Cardiovascular: Negative.    Physical Exam  There were no vitals taken for this visit. Physical Exam Constitutional:      Appearance: Normal appearance.  HENT:     Head:  Normocephalic and atraumatic.     Mouth/Throat:     Mouth: Mucous membranes are moist.     Pharynx: Oropharynx is clear.  Cardiovascular:     Rate and Rhythm: Normal rate and regular rhythm.  Pulmonary:     Effort: Pulmonary effort is normal.     Breath sounds: Wheezing present.  Musculoskeletal:        General: Normal range of motion.     Cervical back: Normal range of motion and neck supple.  Skin:    General: Skin is warm and dry.  Neurological:     General: No focal deficit present.     Mental Status: She is alert and oriented to person, place, and time. Mental status is at baseline.  Psychiatric:        Mood and Affect: Mood normal.        Behavior: Behavior normal.        Thought Content: Thought content normal.        Judgment: Judgment normal.      Lab Results:  CBC    Component Value Date/Time   WBC 6.9 01/26/2023 0900   RBC 4.70 01/26/2023 0900   HGB 14.5 01/26/2023 0900   HGB 14.6 02/12/2021 0847   HGB 13.3 05/24/2015 1547   HCT 42.8 01/26/2023 0900   HCT 42.9 02/12/2021 0847   PLT 231.0 01/26/2023 0900   PLT 311 02/12/2021 0847   MCV 90.9 01/26/2023 0900   MCV 89 02/12/2021 0847   MCH 30.5 10/24/2022 1618   MCHC 34.0 01/26/2023 0900   RDW 13.5 01/26/2023 0900   RDW 12.9 02/12/2021 0847   LYMPHSABS 2.5 01/26/2023 0900   LYMPHSABS 2.1 02/12/2021 0847   MONOABS 0.6 01/26/2023 0900   EOSABS 0.4 01/26/2023 0900   EOSABS 0.5 (H) 02/12/2021 0847   BASOSABS 0.0 01/26/2023 0900   BASOSABS 0.1 02/12/2021 0847    BMET    Component Value Date/Time   NA 139 01/26/2023 0900   NA 139 02/12/2021 0847   K 3.6 01/26/2023 0900   CL 100 01/26/2023 0900   CO2 27 01/26/2023 0900   GLUCOSE 102 (H) 01/26/2023 0900   BUN 10 01/26/2023 0900   BUN 6 02/12/2021 0847   CREATININE 0.78 01/26/2023 0900   CREATININE 0.84 11/29/2019 0816   CALCIUM 9.5 01/26/2023 0900   GFRNONAA >60 10/24/2022 1618   GFRNONAA 86 11/29/2019 0816   GFRAA 100 11/29/2019 0816    BNP     Component Value Date/Time   BNP 36.5 10/24/2022 1618  ProBNP No results found for: PROBNP  Imaging: DG Chest Port 1 View Result Date: 02/03/2023 CLINICAL DATA:  [id number] with shortness of breath and cough with nausea. EXAM: PORTABLE CHEST 1 VIEW COMPARISON:  PA Lat chest 01/30/2023 FINDINGS: The heart size and mediastinal contours are within normal limits. Both lungs are clear. The visualized skeletal structures are unremarkable. IMPRESSION: No evidence of acute chest disease or interval changes. Electronically Signed   By: Francis Quam M.D.   On: 02/03/2023 06:02     Assessment & Plan:   No problem-specific Assessment & Plan notes found for this encounter.   1. History of sleep apnea (Primary) - Ambulatory referral to Dentistry  2. Loud snoring - Ambulatory referral to Dentistry     Post COVID-19 Asthma Exacerbation Persistent coughing and wheezing post COVID-19 infection in December. Chest X-ray was normal. Previously well controlled. No longer on Symbicort , asthma symptoms typically flare seasonally. She is currently using Airsupra  with some relief, prefers this over Symbicort . -Increase Airsupra  to two puffs every four hours as needed, not to exceed twelve puffs in twenty-four hours. -Consider restarting Symbicort  if symptoms persist or worsen   Allergic Rhinitis Disliked Astelin  due to taste, currently using Zyrtec with some relief. -Try Chlortabs 4mg  q6 hours as needed for cough and Atrovent  nasal spray twice daily. Advised patient use saline rinses  Sleep Apnea History of sleep apnea, previously on CPAP but did not tolerate. Recent sleep study inconclusive due to insufficient sleep time and illness during study. Reports snoring, fatigue, and restless leg.  -Consider referral to orthodontic dentist for oral appliance fitting due to moderate snoring and as an alternative to CPAP.     Almarie LELON Ferrari, NP 03/04/2023

## 2023-03-04 NOTE — Patient Instructions (Signed)
-  POST COVID-19 ASTHMA EXACERBATION: This refers to the worsening of asthma symptoms following a COVID-19 infection. You should increase your Airsupra  usage to two puffs every four hours as needed, but do not exceed twelve puffs in twenty-four hours. If your symptoms persist or worsen, we may consider restarting Symbicort .   -ALLERGIC RHINITIS: This is inflammation of the nasal passages due to allergies. Since you dislike the taste of Astelin , you can try Atrovent  nasal spray and Chlor-tabs as needed for your cough and consider using saline rinses.  -SLEEP APNEA: This is a condition where breathing repeatedly stops and starts during sleep. We refer to orthodontics consideration oral appliance due to snoring symptoms.  If this is not an option please notify me and we will repeat home sleep study to evaluate for CPAP need   Follow-up 3-4 months with [facility] NP or sooner if needed

## 2023-03-16 ENCOUNTER — Other Ambulatory Visit (HOSPITAL_COMMUNITY): Payer: Self-pay

## 2023-03-23 ENCOUNTER — Other Ambulatory Visit (HOSPITAL_COMMUNITY): Payer: Self-pay

## 2023-03-24 ENCOUNTER — Other Ambulatory Visit (HOSPITAL_COMMUNITY): Payer: Self-pay

## 2023-04-01 ENCOUNTER — Other Ambulatory Visit: Payer: Self-pay | Admitting: Family Medicine

## 2023-04-01 ENCOUNTER — Other Ambulatory Visit (HOSPITAL_COMMUNITY): Payer: Self-pay

## 2023-04-01 MED ORDER — CLONAZEPAM 0.5 MG PO TABS
0.2500 mg | ORAL_TABLET | Freq: Every day | ORAL | 1 refills | Status: DC
  Filled 2023-04-01: qty 20, 20d supply, fill #0

## 2023-04-01 NOTE — Telephone Encounter (Signed)
 01/26/2023 LOV  10/28/2022 fill date  20/0 refills

## 2023-04-05 ENCOUNTER — Ambulatory Visit (INDEPENDENT_AMBULATORY_CARE_PROVIDER_SITE_OTHER): Admitting: Family Medicine

## 2023-04-05 VITALS — BP 134/84 | HR 67 | Temp 97.9°F | Ht 64.0 in | Wt 221.2 lb

## 2023-04-05 DIAGNOSIS — F330 Major depressive disorder, recurrent, mild: Secondary | ICD-10-CM | POA: Diagnosis not present

## 2023-04-05 DIAGNOSIS — I10 Essential (primary) hypertension: Secondary | ICD-10-CM

## 2023-04-05 DIAGNOSIS — E538 Deficiency of other specified B group vitamins: Secondary | ICD-10-CM | POA: Diagnosis not present

## 2023-04-05 DIAGNOSIS — R5383 Other fatigue: Secondary | ICD-10-CM

## 2023-04-05 DIAGNOSIS — M2550 Pain in unspecified joint: Secondary | ICD-10-CM

## 2023-04-05 DIAGNOSIS — N951 Menopausal and female climacteric states: Secondary | ICD-10-CM

## 2023-04-05 DIAGNOSIS — M542 Cervicalgia: Secondary | ICD-10-CM

## 2023-04-05 DIAGNOSIS — E559 Vitamin D deficiency, unspecified: Secondary | ICD-10-CM

## 2023-04-05 DIAGNOSIS — F411 Generalized anxiety disorder: Secondary | ICD-10-CM | POA: Diagnosis not present

## 2023-04-05 DIAGNOSIS — R768 Other specified abnormal immunological findings in serum: Secondary | ICD-10-CM

## 2023-04-05 MED ORDER — BUPROPION HCL ER (XL) 150 MG PO TB24: 150.0000 mg | ORAL_TABLET | Freq: Every day | ORAL | 3 refills | Status: DC

## 2023-04-05 MED ORDER — CYCLOBENZAPRINE HCL 10 MG PO TABS: 10.0000 mg | ORAL_TABLET | Freq: Every evening | ORAL | 0 refills | Status: DC | PRN

## 2023-04-05 MED ORDER — TRAMADOL HCL 50 MG PO TABS: 50.0000 mg | ORAL_TABLET | Freq: Four times a day (QID) | ORAL | 0 refills | Status: AC | PRN

## 2023-04-05 NOTE — Progress Notes (Signed)
 Subjective  CC:  Chief Complaint  Patient presents with   Facial Pain    Facial numbness/tingling when laying down general body weakness, aching legs at night, neck pain    HPI: Monica Benjamin is a 43 y.o. female who presents to the office today to address the problems listed above in the chief complaint. Discussed the use of AI scribe software for clinical note transcription with the patient, who gave verbal consent to proceed.  History of Present Illness   The patient is a 43 year old who presents with neck pain and associated symptoms.  She has been experiencing neck pain for the past three months, primarily on the right side, which worsens with movement and is tender upon palpation. She describes a 'knot' in the neck area that has not increased in size. The pain occasionally radiates downwards and is associated with facial tingling and numbness on both sides, as well as arm weakness described as heaviness. These symptoms have persisted since her breast reduction surgery.  She experiences intermittent numbness and tingling in her legs, particularly at night, and reports waking up with a numb tongue. Her arms and legs feel heavy and weak, though she is able to perform daily activities without dropping items or being unable to lift them. She also reports cold extremities and occasional leg cramps if she does not drink enough water.  Her sleep has been poor, and despite a negative sleep apnea test, she does not feel rested. She has been fitted for an orthodontic device for snoring. She reports significant fatigue, stating she struggles to keep her eyes open during the day despite sleeping well at night.  She has a history of anxiety, which she feels has worsened due to her current symptoms. She reports increased use of Klonopin due to anxiety about her health, including fears of having a heart attack or stroke. She also experiences eye twitching and perioral numbness, which she  associates with panic attacks.  She has been taking vitamin D and B12 supplements but does not feel they have improved her energy levels. She also reports dizziness associated with losartan hydrochlorothiazide, which she has been taking for blood pressure management, noting that taking it at night reduces dizziness.       Assessment  1. Neck pain on left side   2. Vitamin D deficiency   3. Vitamin B12 deficiency   4. GAD (generalized anxiety disorder)   5. Fatigue, unspecified type   6. Essential hypertension   7. Mild episode of recurrent major depressive disorder (HCC)   8. Perimenopausal symptoms      Plan  Assessment and Plan    Neck Pain Chronic musculoskeletal neck pain likely due to stress and tension. No significant structural abnormalities. NSAIDs contraindicated due to allergic reaction. - Refer to physical therapy. - Prescribe muscle relaxant for nighttime. - Apply heat to neck. - Prescribe tramadol for pain.  Anxiety Major anxiety exacerbating symptoms. Increased Klonopin use noted. Wellbutrin considered for additional management. - Add Wellbutrin to Trintellix. - Continue Klonopin as needed. - Monitor response to Wellbutrin.  Hypertension Dizziness possibly related to losartan hydrochlorothiazide or anxiety. Blood pressure generally normal. - Monitor blood pressure and symptoms. - Consider alternative antihypertensive if dizziness persists.  Perimenopausal Symptoms Symptoms suggestive of perimenopause affecting mood and sleep. Previous tests confirm perimenopausal status. - Discuss management options for perimenopausal symptoms.        Orders Placed This Encounter  Procedures   B12 and Folate Panel  CBC with Differential/Platelet   CK   Comprehensive metabolic panel   Sedimentation rate   ANA   Magnesium   TSH   VITAMIN D 25 Hydroxy (Vit-D Deficiency, Fractures)   Ambulatory referral to Physical Therapy   Meds ordered this encounter   Medications   buPROPion (WELLBUTRIN XL) 150 MG 24 hr tablet    Sig: Take 1 tablet (150 mg total) by mouth daily.    Dispense:  90 tablet    Refill:  3   traMADol (ULTRAM) 50 MG tablet    Sig: Take 1 tablet (50 mg total) by mouth every 6 (six) hours as needed for moderate pain (pain score 4-6).    Dispense:  30 tablet    Refill:  0   cyclobenzaprine (FLEXERIL) 10 MG tablet    Sig: Take 1 tablet (10 mg total) by mouth at bedtime as needed for muscle spasms.    Dispense:  30 tablet    Refill:  0     I reviewed the patients updated PMH, FH, and SocHx.    Patient Active Problem List   Diagnosis Date Noted   Essential hypertension 03/16/2018    Priority: High   History of obstructive sleep apnea 05/25/2016    Priority: High   Moderate persistent asthma without complication 11/25/2015    Priority: High   [place] (HCC) 10/12/2022    Priority: Medium    NAFLD (nonalcoholic fatty liver disease) 16/10/9602    Priority: Medium    Mild episode of recurrent major depressive disorder (HCC) 05/10/2018    Priority: Medium    Hypertriglyceridemia 05/10/2018    Priority: Medium    Chronic seasonal allergic rhinitis due to pollen 11/25/2015    Priority: Medium    GAD (generalized anxiety disorder) 11/25/2015    Priority: Medium    Gastroesophageal reflux disease without esophagitis 11/25/2015    Priority: Medium    Irritable bowel syndrome with both constipation and diarrhea 11/25/2015    Priority: Medium    Vitamin B12 deficiency 01/30/2023    Priority: Low   Perimenopausal symptoms 10/12/2022    Priority: Low   Vitamin D deficiency 02/26/2021    Priority: Low   Infertility, female 02/15/2017    Priority: Low   Current Meds  Medication Sig   albuterol (VENTOLIN HFA) 108 (90 Base) MCG/ACT inhaler Inhale 2 puffs into the lungs every 4 (four) hours as needed (cough, shortness of breath or wheezing.).   Albuterol-Budesonide (AIRSUPRA) 90-80 MCG/ACT AERO Inhale 2 puffs into the lungs  every 4 (four) hours as needed (Asthma symptoms).   buPROPion (WELLBUTRIN XL) 150 MG 24 hr tablet Take 1 tablet (150 mg total) by mouth daily.   cetirizine (ZYRTEC) 10 MG tablet Take 10 mg by mouth daily.   chlorpheniramine (CHLOR-TRIMETON) 4 MG tablet Take 1 tablet (4 mg total) by mouth every 6 (six) hours as needed for allergies (Cough).   clonazePAM (KLONOPIN) 0.5 MG tablet Take 1/2-1 tablet (0.25-0.5 mg total) by mouth daily as needed for anxiety   cyclobenzaprine (FLEXERIL) 10 MG tablet Take 1 tablet (10 mg total) by mouth at bedtime as needed for muscle spasms.   hyoscyamine (LEVSIN/SL) 0.125 MG SL tablet Place 1 tablet (0.125 mg total) under the tongue 3 (three) times daily before meals as needed for bloating and cramping   ipratropium (ATROVENT) 0.03 % nasal spray Place 2 sprays into both nostrils every 12 (twelve) hours.   losartan-hydrochlorothiazide (HYZAAR) 50-12.5 MG tablet Take 1 tablet by mouth daily.  Omega-3 Fatty Acids (FISH OIL) 1000 MG CAPS Take 1 capsule by mouth daily.   pantoprazole (PROTONIX) 20 MG tablet Take 1 tablet (20 mg total) by mouth daily.   traMADol (ULTRAM) 50 MG tablet Take 1 tablet (50 mg total) by mouth every 6 (six) hours as needed for moderate pain (pain score 4-6).   Vitamin D, Ergocalciferol, (DRISDOL) 1.25 MG (50000 UNIT) CAPS capsule Take 1 capsule (50,000 Units total) by mouth once a week.   vortioxetine HBr (TRINTELLIX) 10 MG TABS tablet Take 1 tablet (10 mg total) by mouth daily.    Allergies: Patient is allergic to ace inhibitors, nsaids, and prednisone. Family History: Patient family history includes Alcoholism in her father and another family member; Anxiety disorder in her mother and another family member; COPD in her paternal grandmother; Dementia in her maternal grandmother; Diabetes in her maternal grandfather; Hyperlipidemia in her father; Hypertension in her father; Migraines in her mother and sister; Osteoporosis in her mother. Social  History:  Patient  reports that she has never smoked. She has never used smokeless tobacco. She reports that she does not currently use alcohol. She reports that she does not use drugs.  Review of Systems: Constitutional: Negative for fever malaise or anorexia Cardiovascular: negative for chest pain Respiratory: negative for SOB or persistent cough Gastrointestinal: negative for abdominal pain  Objective  Vitals: BP 134/84   Pulse 67   Temp 97.9 F (36.6 C)   Ht 5\' 4"  (1.626 m)   Wt 221 lb 3.2 oz (100.3 kg)   SpO2 99%   BMI 37.97 kg/m  General: no acute distress , A&Ox3, appears tired HEENT: PEERL, conjunctiva normal, neck is supple, pain with right rotation, right lateral flexion, tender over right sternocleidomastoid muscle without masses.  No lymphadenopathy.  Minimal trap tenderness bilaterally. Cardiovascular:  RRR without murmur or gallop.  Respiratory:  Good breath sounds bilaterally, CTAB with normal respiratory effort Skin:  Warm, no rashes Neuro: Cranial nerves II through XII intact.  Normal DTRs.  Normal strength bilaterally.  Commons side effects, risks, benefits, and alternatives for medications and treatment plan prescribed today were discussed, and the patient expressed understanding of the given instructions. Patient is instructed to call or message via MyChart if he/she has any questions or concerns regarding our treatment plan. No barriers to understanding were identified. We discussed Red Flag symptoms and signs in detail. Patient expressed understanding regarding what to do in case of urgent or emergency type symptoms.  Medication list was reconciled, printed and provided to the patient in AVS. Patient instructions and summary information was reviewed with the patient as documented in the AVS. This note was prepared with assistance of Dragon voice recognition software. Occasional wrong-word or sound-a-like substitutions may have occurred due to the inherent limitations  of voice recognition software

## 2023-04-05 NOTE — Patient Instructions (Signed)
 Please return in 6 weeks for recheck  I will release your lab results to you on your MyChart account with further instructions. You may see the results before I do, but when I review them I will send you a message with my report or have my assistant call you if things need to be discussed. Please reply to my message with any questions. Thank you!   If you have any questions or concerns, please don't hesitate to send me a message via MyChart or call the office at [phone]. Thank you for visiting with [place] today! It's our pleasure caring for you.   VISIT SUMMARY:  Today, we discussed your ongoing neck pain, anxiety, hypertension, and possible perimenopausal symptoms. We reviewed your symptoms in detail and made several adjustments to your treatment plan to help manage your conditions more effectively.  YOUR PLAN:  -NECK PAIN: Your neck pain is likely due to stress and tension in the muscles. We will refer you to physical therapy to help relieve the pain and prescribe a muscle relaxant for nighttime use. Additionally, applying heat to your neck may provide some relief, and tramadol has been prescribed for pain management.  -ANXIETY: Your anxiety appears to be worsening your symptoms. We will add Wellbutrin to your current medication, Trintellix, to help manage your anxiety better. You should continue taking Klonopin as needed and we will monitor your response to the new medication.  -HYPERTENSION: Your dizziness may be related to your blood pressure medication or anxiety. We will monitor your blood pressure and symptoms closely and consider changing your medication if the dizziness continues.   INSTRUCTIONS:  Please follow up with physical therapy as soon as possible for your neck pain. Continue taking your medications as prescribed and monitor your symptoms. If you experience persistent dizziness, contact us to discuss alternative blood pressure medications. We will also schedule a follow-up  appointment to review your response to the new anxiety medication and discuss management options for your perimenopausal symptoms.

## 2023-04-06 DIAGNOSIS — F321 Major depressive disorder, single episode, moderate: Secondary | ICD-10-CM | POA: Diagnosis not present

## 2023-04-06 LAB — COMPREHENSIVE METABOLIC PANEL
ALT: 58 U/L — ABNORMAL HIGH (ref 0–35)
AST: 75 U/L — ABNORMAL HIGH (ref 0–37)
Albumin: 4.2 g/dL (ref 3.5–5.2)
Alkaline Phosphatase: 96 U/L (ref 39–117)
BUN: 9 mg/dL (ref 6–23)
CO2: 27 meq/L (ref 19–32)
Calcium: 9.7 mg/dL (ref 8.4–10.5)
Chloride: 102 meq/L (ref 96–112)
Creatinine, Ser: 0.73 mg/dL (ref 0.40–1.20)
GFR: 99.96 mL/min (ref >60.00)
Glucose, Bld: 101 mg/dL — ABNORMAL HIGH (ref 70–99)
Potassium: 3.9 meq/L (ref 3.5–5.1)
Sodium: 138 meq/L (ref 135–145)
Total Bilirubin: 0.4 mg/dL (ref 0.2–1.2)
Total Protein: 7.2 g/dL (ref 6.0–8.3)

## 2023-04-06 LAB — CBC WITH DIFFERENTIAL/PLATELET
Basophils Absolute: 0.1 10*3/uL (ref 0.0–0.1)
Basophils Relative: 1.3 % (ref 0.0–3.0)
Eosinophils Absolute: 0.3 10*3/uL (ref 0.0–0.7)
Eosinophils Relative: 3.2 % (ref 0.0–5.0)
HCT: 42.6 % (ref 36.0–46.0)
Hemoglobin: 14.3 g/dL (ref 12.0–15.0)
Lymphocytes Relative: 26.6 % (ref 12.0–46.0)
Lymphs Abs: 2.6 10*3/uL (ref 0.7–4.0)
MCHC: 33.5 g/dL (ref 30.0–36.0)
MCV: 93 fl (ref 78.0–100.0)
Monocytes Absolute: 0.5 10*3/uL (ref 0.1–1.0)
Monocytes Relative: 5.2 % (ref 3.0–12.0)
Neutro Abs: 6.3 10*3/uL (ref 1.4–7.7)
Neutrophils Relative %: 63.7 % (ref 43.0–77.0)
Platelets: 305 10*3/uL (ref 150.0–400.0)
RBC: 4.58 Mil/uL (ref 3.87–5.11)
RDW: 14.4 % (ref 11.5–15.5)
WBC: 9.9 10*3/uL (ref 4.0–10.5)

## 2023-04-06 LAB — B12 AND FOLATE PANEL
Folate: 10.6 ng/mL (ref >5.9)
Vitamin B-12: 610 pg/mL (ref 211–911)

## 2023-04-06 LAB — VITAMIN D 25 HYDROXY (VIT D DEFICIENCY, FRACTURES): VITD: 29.62 ng/mL — ABNORMAL LOW (ref 30.00–100.00)

## 2023-04-06 LAB — CK: Total CK: 36 U/L (ref 7–177)

## 2023-04-06 LAB — SEDIMENTATION RATE: Sed Rate: 42 mm/h — ABNORMAL HIGH (ref 0–20)

## 2023-04-06 LAB — TSH: TSH: 1.93 u[IU]/mL (ref 0.35–5.50)

## 2023-04-06 LAB — MAGNESIUM: Magnesium: 2.1 mg/dL (ref 1.5–2.5)

## 2023-04-06 NOTE — Progress Notes (Signed)
 Is it possible to add an ANA? Dx: myalgias and fatigue and elevated ESR. Thanks, Dr. Mardelle Matte '

## 2023-04-07 ENCOUNTER — Other Ambulatory Visit: Payer: Self-pay

## 2023-04-07 DIAGNOSIS — R700 Elevated erythrocyte sedimentation rate: Secondary | ICD-10-CM

## 2023-04-07 DIAGNOSIS — R5383 Other fatigue: Secondary | ICD-10-CM

## 2023-04-07 LAB — ANTI-NUCLEAR AB-TITER (ANA TITER): ANA Titer 1: 1:40 {titer} — ABNORMAL HIGH

## 2023-04-07 LAB — ANA: Anti Nuclear Antibody (ANA): POSITIVE — AB

## 2023-04-07 NOTE — Progress Notes (Signed)
 Noted.

## 2023-04-08 ENCOUNTER — Encounter: Payer: Self-pay | Admitting: Family Medicine

## 2023-04-08 NOTE — Progress Notes (Signed)
 See mychart note Dear Monica Benjamin, As you can see, some of your blood test results are flagged. They are showing signs of inflammation. Your ANA is positive; it is low positive and this can be found in a variety of conditions and in normal patients; however, with your pain symptoms and mildly elevated inflammation marker, ESR, I will see if our rheumatologist will see you for further consultation.  I'd like to try prednisone to see if this helps your pain but see that you are unable to take it due to leg cramps?  I've placed a referral and will be in touch.  Sincerely, Dr. Mardelle Matte

## 2023-04-08 NOTE — Addendum Note (Signed)
 Addended by: Asencion Partridge on: 04/08/2023 01:38 PM   Modules accepted: Orders

## 2023-04-13 DIAGNOSIS — F321 Major depressive disorder, single episode, moderate: Secondary | ICD-10-CM | POA: Diagnosis not present

## 2023-04-13 NOTE — Progress Notes (Unsigned)
 Office Visit Note  Patient: Monica Benjamin             Date of Birth: 1980/10/04           MRN: [id number]             PCP: Willow Ora, MD Referring: Willow Ora, MD Visit Date: 04/14/2023 Occupation: @GUAROCC @  Subjective:  Pain in joints and muscles   History of Present Illness: Michaline P Gockley is a 43 y.o. female seen for the evaluation of positive ANA and arthralgias.  According the patient her symptoms started with thoracic pain in 2018.  She was under care of doctors at [facility].  She recalls having x-rays, physical therapy and cortisone injections by Dr. Ethelene Hal.  In December 2023 she underwent bilateral breast reduction surgery and liposuction in the thoracic region.  She states in February 2024 she started experiencing weakness in her arms and also tightness from the sports bra.  She states the whole 2024 she had frequent upper respiratory tract infections which triggered asthma.  She also has been experiencing increased fatigue and generalized muscle pain.  She has been having increased discomfort and pain in her neck, occasional discomfort in the thoracic region.  She also has lower back pain and pain in her shoulders and trochanteric region.  None of the other joints are painful.  She has not seen any visible joint swelling.  There is no history of plantar fasciitis or Achilles tendinitis.  The muscle pain is mostly around her shoulders.  She has chronic insomnia and fatigue.  She had stressful time in her life in her 30s for which she required counseling later.  There is no family history of autoimmune disease.  She is married.  Right-handed.  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  She also enjoys walking.  She lives with his wife Hospital doctor and 2 adopted children who are 6 and 3.  She drinks alcohol only socially and does not smoke.    Activities of Daily Living:  Patient reports morning  stiffness for 30-60 minutes.   Patient Reports nocturnal pain.  Difficulty dressing/grooming: Denies Difficulty climbing stairs: Denies Difficulty getting out of chair: Denies Difficulty using hands for taps, buttons, cutlery, and/or writing: Denies  Review of Systems  Constitutional:  Positive for fatigue.  HENT:  Positive for mouth dryness. Negative for mouth sores.   Eyes:  Negative for dryness.  Respiratory:  Negative for shortness of breath.   Cardiovascular:  Negative for chest pain and palpitations.  Gastrointestinal:  Positive for diarrhea. Negative for blood in stool and constipation.  Endocrine: Negative for increased urination.  Genitourinary:  Negative for involuntary urination.  Musculoskeletal:  Positive for joint pain, joint pain, myalgias, morning stiffness, muscle tenderness and myalgias. Negative for gait problem, joint swelling and muscle weakness.  Skin:  Positive for sensitivity to sunlight. Negative for color change, rash and hair loss.  Allergic/Immunologic: Positive for susceptible to infections.  Neurological:  Positive for dizziness. Negative for headaches.  Hematological:  Positive for swollen glands.  Psychiatric/Behavioral:  Positive for depressed mood and sleep disturbance. The patient is nervous/anxious.     PMFS History:  Patient Active Problem List   Diagnosis Date Noted   Vitamin B12 deficiency 01/30/2023   Hamartoma (HCC) 10/12/2022   Perimenopausal symptoms 10/12/2022   Vitamin D deficiency 02/26/2021   NAFLD (nonalcoholic fatty liver disease) 78/29/5621  Mild episode of recurrent major depressive disorder (HCC) 05/10/2018   Hypertriglyceridemia 05/10/2018   Essential hypertension 03/16/2018   Infertility, female 02/15/2017   History of obstructive sleep apnea 05/25/2016   Chronic seasonal allergic rhinitis due to pollen 11/25/2015   GAD (generalized anxiety disorder) 11/25/2015   Gastroesophageal reflux disease without esophagitis  11/25/2015   Irritable bowel syndrome with both constipation and diarrhea 11/25/2015   Moderate persistent asthma without complication 11/25/2015    Past Medical History:  Diagnosis Date   Allergy    Anxiety    Asthma    Back pain    Depression    GERD (gastroesophageal reflux disease)    Hyperlipidemia    Hypertension    IBS (irritable bowel syndrome)    Infertility, female 02/15/2017   IVF treatment   Lumbar herniated disc    L4-L5   Migraines    Other fatigue    Shortness of breath on exertion    Sleep apnea    Trigeminal neuralgia    Vitamin D deficiency 01/26/2021    Family History  Problem Relation Age of Onset   Anxiety disorder Mother    Osteoporosis Mother    Migraines Mother    Alcoholism Father    Hyperlipidemia Father    Hypertension Father    Migraines Sister    Dementia Maternal Grandmother    Diabetes Maternal Grandfather    COPD Paternal Grandmother    Anxiety disorder Other    Alcoholism Other    Cancer Neg Hx    Heart disease Neg Hx    Past Surgical History:  Procedure Laterality Date   BREAST REDUCTION SURGERY Bilateral 12/31/2021   Procedure: BREAST REDUCTION WITH LIPOSUCTION;  Surgeon: Peggye Form, DO;  Location: [facility];  Service: Plastics;  Laterality: Bilateral;   WISDOM TOOTH EXTRACTION     Social History   Social History Narrative   Married to [employer] History  Administered Date(s) Administered   HPV Quadrivalent 10/09/2004, 12/17/2004, 04/29/2005   Influenza, Seasonal, Injecte, Preservative Fre 10/28/2015   Influenza,inj,Quad PF,6+ Mos 10/28/2015, 10/27/2019   Influenza,inj,quad, With Preservative 10/27/2018   Influenza-Unspecified 10/07/2016, 11/08/2020, 11/12/2021   PFIZER(Purple Top)SARS-COV-2 Vaccination 02/10/2019, 03/03/2019   Tdap 10/12/2006, 10/31/2013     Objective: Vital Signs: BP (!) 128/90 (BP Location: Right Arm, Patient Position: Sitting, Cuff Size: Large)   Pulse 72    Resp 14   Ht 5\' 4"  (1.626 m)   Wt 220 lb (99.8 kg)   LMP 04/08/2023   BMI 37.76 kg/m    Physical Exam Vitals and nursing note reviewed.  Constitutional:      Appearance: She is well-developed.  HENT:     Head: Normocephalic and atraumatic.  Eyes:     Conjunctiva/sclera: Conjunctivae normal.  Cardiovascular:     Rate and Rhythm: Normal rate and regular rhythm.     Heart sounds: Normal heart sounds.  Pulmonary:     Effort: Pulmonary effort is normal.     Breath sounds: Normal breath sounds.  Abdominal:     General: Bowel sounds are normal.     Palpations: Abdomen is soft.  Musculoskeletal:     Cervical back: Normal range of motion.  Lymphadenopathy:     Cervical: No cervical adenopathy.  Skin:    General: Skin is warm and dry.     Capillary Refill: Capillary refill takes less than 2 seconds.  Neurological:     Mental Status: She is alert and oriented to person, place,  and time.  Psychiatric:        Behavior: Behavior normal.      Musculoskeletal Exam: Cervical spine was in good range of motion with discomfort.  She had good range of motion of the thoracic spine without any point tenderness.  She had good range of motion of her lumbar spine without any tenderness.  There was no tenderness over SI joints.  Shoulder joints, elbow joints, wrist joints, MCPs PIPs and DIPs with good range of motion with no synovitis.  Hip joints, knee joints were in good range of motion without any warmth swelling or effusion.  There was no tenderness over ankles or MTPs.  There was no plantar fasciitis or Achilles tendinitis.  CDAI Exam: CDAI Score: -- Patient Global: --; Provider Global: -- Swollen: --; Tender: -- Joint Exam 04/14/2023   No joint exam has been documented for this visit   There is currently no information documented on the homunculus. Go to the Rheumatology activity and complete the homunculus joint exam.  Investigation: No additional findings.  Imaging: No results  found.  Recent Labs: Lab Results  Component Value Date   WBC 9.9 04/05/2023   HGB 14.3 04/05/2023   PLT 305.0 04/05/2023   NA 138 04/05/2023   K 3.9 04/05/2023   CL 102 04/05/2023   CO2 27 04/05/2023   GLUCOSE 101 (H) 04/05/2023   BUN 9 04/05/2023   CREATININE 0.73 04/05/2023   BILITOT 0.4 04/05/2023   ALKPHOS 96 04/05/2023   AST 75 (H) 04/05/2023   ALT 58 (H) 04/05/2023   PROT 7.2 04/05/2023   ALBUMIN 4.2 04/05/2023   CALCIUM 9.7 04/05/2023   GFRAA 100 11/29/2019   January 26, 2023 LDL 101, triglycerides 284, TSH normal, vitamin D20.46, hepatitis C nonreactive April 05, 2023 B12 normal, folate normal, ANA 1: 40 NS, TSH normal, vitamin D29.62, CK 36, sed rate 42, magnesium 2.1  Speciality Comments: No specialty comments available.  Procedures:  No procedures performed Allergies: Ace inhibitors, Nsaids, and Prednisone   Assessment / Plan:     Visit Diagnoses: Polyarthralgia -patient complains of pain and discomfort in multiple joints.  She states the pain is mostly in her shoulders, hip joints, cervical spine, lumbar spine.  No synovitis was noted on the examination.  Her sed rate was recently elevated but she had an infection at the time.  I will repeat sed rate today.  I will also obtain some additional labs.  Plan: Sedimentation rate, Rheumatoid factor, Cyclic citrul peptide antibody, IgG  Positive ANA (antinuclear antibody) -she had low titer positive ANA.  She gives history of dry mouth, joint pain, photosensitivity.  She also gives history of intermittent lymphadenopathy which she relates to infection.  There is no history of oral ulcers, nasal ulcers, malar rash, hair loss or inflammatory arthritis.  I will obtain additional labs today.  Plan: ANA, Anti-scleroderma antibody, RNP Antibody, Anti-Smith antibody, Sjogrens syndrome-A extractable nuclear antibody, Sjogrens syndrome-B extractable nuclear antibody, Anti-DNA antibody, double-stranded, C3 and C4.  I will discuss  results at the follow-up visit.  Chronic pain of both shoulders-she complains of discomfort in her bilateral shoulders.  Both shoulders were in good range of motion without discomfort.  There was no point tenderness on palpation of her shoulders.  A handout on shoulder joint exercises was given.  Chronic pain of both hips-she complete some discomfort in her bilateral hips.  Both hip joints were in good range of motion without any discomfort.  A handout on hip exercises was  given.  Neck pain-she is of neck pain and stiffness.  She has had x-rays at [facility] which she will bring at the next visit.  Patient believes that she has degenerative changes in her cervical spine.  Pain in thoracic spine-she complains of thoracic discomfort.  Notes discomfort at the thoracic region improved a lot after breast reduction surgery.  Lumbar spondylosis-she had previous x-rays and MRI which showed degenerative changes.  She has intermittent lower back pain.  She denies any radiculopathy.  Myalgia -she complains of generalized muscle pain and episodic increased muscle pain.  She also gives history of chronic insomnia and fatigue.  She gives history of generalized hyperalgesia.  Possibility of myofascial pain syndrome/fibromyalgia syndrome was discussed.  Benefits of water aerobics, swimming and stretching were discussed.  She recently started Wellbutrin which should also help.  I offered her referral to integrative therapy but she will like to hold off for now plan: CK  Other fatigue -she experiences fatigue which she relates to insomnia.  I will obtain following labs today.  Plan: TSH, Serum protein electrophoresis with reflex  Primary insomnia-she can history of chronic insomnia.  Good sleep hygiene and possible use of melatonin was also discussed.  Essential hypertension-blood pressure is elevated at 128/90 today.  She was advised to monitor blood pressure closely and follow-up with her  PCP.  Hypertriglyceridemia  Vitamin D deficiency-she has vitamin D deficiency which can also contribute to myalgias.  She is currently taking vitamin D 50,000 units.  I advised her to start vitamin D 5000 units daily Monday to Friday after finishing the course of vitamin D prescription.  NAFLD (nonalcoholic fatty liver disease)-she has elevated LFTs.  She had ultrasound of the liver which showed fatty liver.  Gastroesophageal reflux disease without esophagitis  Irritable bowel syndrome with both constipation and diarrhea - Mostly diarrhea.  She takes Levsin.  She had a colonoscopy by Dr. Loreta Ave in the past.  Moderate persistent asthma without complication-patient states she gets asthma flares when she gets upper respiratory tract infections.  Chronic seasonal allergic rhinitis due to pollen  GAD (generalized anxiety disorder)-she had lot of stress in her 30s.  She has been going to a Veterinary surgeon.  She also recently started Wellbutrin.  Mild episode of recurrent major depressive disorder (HCC)  Perimenopausal symptoms  Orders: Orders Placed This Encounter  Procedures   Sedimentation rate   CK   TSH   Rheumatoid factor   Cyclic citrul peptide antibody, IgG   ANA   Anti-scleroderma antibody   RNP Antibody   Anti-Smith antibody   Sjogrens syndrome-A extractable nuclear antibody   Sjogrens syndrome-B extractable nuclear antibody   Anti-DNA antibody, double-stranded   C3 and C4   Serum protein electrophoresis with reflex   No orders of the defined types were placed in this encounter.  .  Follow-Up Instructions: Return for polyarthralgia, myalgia.   Pollyann Savoy, MD  Note - This record has been created using Animal nutritionist.  Chart creation errors have been sought, but may not always  have been located. Such creation errors do not reflect on  the standard of medical care.

## 2023-04-14 ENCOUNTER — Ambulatory Visit: Attending: Rheumatology | Admitting: Rheumatology

## 2023-04-14 ENCOUNTER — Encounter: Payer: Self-pay | Admitting: Rheumatology

## 2023-04-14 VITALS — BP 128/90 | HR 72 | Resp 14 | Ht 64.0 in | Wt 220.0 lb

## 2023-04-14 DIAGNOSIS — M546 Pain in thoracic spine: Secondary | ICD-10-CM | POA: Diagnosis not present

## 2023-04-14 DIAGNOSIS — M25551 Pain in right hip: Secondary | ICD-10-CM | POA: Diagnosis not present

## 2023-04-14 DIAGNOSIS — R768 Other specified abnormal immunological findings in serum: Secondary | ICD-10-CM

## 2023-04-14 DIAGNOSIS — J301 Allergic rhinitis due to pollen: Secondary | ICD-10-CM

## 2023-04-14 DIAGNOSIS — F411 Generalized anxiety disorder: Secondary | ICD-10-CM

## 2023-04-14 DIAGNOSIS — M25511 Pain in right shoulder: Secondary | ICD-10-CM | POA: Diagnosis not present

## 2023-04-14 DIAGNOSIS — M2550 Pain in unspecified joint: Secondary | ICD-10-CM | POA: Diagnosis not present

## 2023-04-14 DIAGNOSIS — I10 Essential (primary) hypertension: Secondary | ICD-10-CM

## 2023-04-14 DIAGNOSIS — G8929 Other chronic pain: Secondary | ICD-10-CM

## 2023-04-14 DIAGNOSIS — M25552 Pain in left hip: Secondary | ICD-10-CM

## 2023-04-14 DIAGNOSIS — F330 Major depressive disorder, recurrent, mild: Secondary | ICD-10-CM

## 2023-04-14 DIAGNOSIS — J4540 Moderate persistent asthma, uncomplicated: Secondary | ICD-10-CM

## 2023-04-14 DIAGNOSIS — F5101 Primary insomnia: Secondary | ICD-10-CM | POA: Diagnosis not present

## 2023-04-14 DIAGNOSIS — K219 Gastro-esophageal reflux disease without esophagitis: Secondary | ICD-10-CM

## 2023-04-14 DIAGNOSIS — M791 Myalgia, unspecified site: Secondary | ICD-10-CM | POA: Diagnosis not present

## 2023-04-14 DIAGNOSIS — E559 Vitamin D deficiency, unspecified: Secondary | ICD-10-CM

## 2023-04-14 DIAGNOSIS — M25512 Pain in left shoulder: Secondary | ICD-10-CM

## 2023-04-14 DIAGNOSIS — R5383 Other fatigue: Secondary | ICD-10-CM

## 2023-04-14 DIAGNOSIS — K760 Fatty (change of) liver, not elsewhere classified: Secondary | ICD-10-CM

## 2023-04-14 DIAGNOSIS — R7689 Other specified abnormal immunological findings in serum: Secondary | ICD-10-CM

## 2023-04-14 DIAGNOSIS — M542 Cervicalgia: Secondary | ICD-10-CM | POA: Diagnosis not present

## 2023-04-14 DIAGNOSIS — M47816 Spondylosis without myelopathy or radiculopathy, lumbar region: Secondary | ICD-10-CM | POA: Diagnosis not present

## 2023-04-14 DIAGNOSIS — K582 Mixed irritable bowel syndrome: Secondary | ICD-10-CM

## 2023-04-14 DIAGNOSIS — N951 Menopausal and female climacteric states: Secondary | ICD-10-CM

## 2023-04-14 DIAGNOSIS — E781 Pure hyperglyceridemia: Secondary | ICD-10-CM

## 2023-04-14 DIAGNOSIS — Z8669 Personal history of other diseases of the nervous system and sense organs: Secondary | ICD-10-CM

## 2023-04-14 NOTE — Patient Instructions (Signed)
Shoulder Exercises Ask your health care provider which exercises are safe for you. Do exercises exactly as told by your health care provider and adjust them as directed. It is normal to feel mild stretching, pulling, tightness, or discomfort as you do these exercises. Stop right away if you feel sudden pain or your pain gets worse. Do not begin these exercises until told by your health care provider. Stretching exercises External rotation and abduction This exercise is sometimes called corner stretch. The exercise rotates your arm outward (external rotation) and moves your arm out from your body (abduction). Stand in a doorway with one of your feet slightly in front of the other. This is called a staggered stance. If you cannot reach your forearms to the door frame, stand facing a corner of a room. Choose one of the following positions as told by your health care provider: Place your hands and forearms on the door frame above your head. Place your hands and forearms on the door frame at the height of your head. Place your hands on the door frame at the height of your elbows. Slowly move your weight onto your front foot until you feel a stretch across your chest and in the front of your shoulders. Keep your head and chest upright and keep your abdominal muscles tight. Hold for __________ seconds. To release the stretch, shift your weight to your back foot. Repeat __________ times. Complete this exercise __________ times a day. Extension, standing  Stand and hold a broomstick, a cane, or a similar object behind your back. Your hands should be a little wider than shoulder-width apart. Your palms should face away from your back. Keeping your elbows straight and your shoulder muscles relaxed, move the stick away from your body until you feel a stretch in your shoulders (extension). Avoid shrugging your shoulders while you move the stick. Keep your shoulder blades tucked down toward the middle of your  back. Hold for __________ seconds. Slowly return to the starting position. Repeat __________ times. Complete this exercise __________ times a day. Range-of-motion exercises Pendulum  Stand near a wall or a surface that you can hold onto for balance. Bend at the waist and let your left / right arm hang straight down. Use your other arm to support you. Keep your back straight and do not lock your knees. Relax your left / right arm and shoulder muscles, and move your hips and your trunk so your left / right arm swings freely. Your arm should swing because of the motion of your body, not because you are using your arm or shoulder muscles. Keep moving your hips and trunk so your arm swings in the following directions, as told by your health care provider: Side to side. Forward and backward. In clockwise and counterclockwise circles. Continue each motion for __________ seconds, or for as long as told by your health care provider. Slowly return to the starting position. Repeat __________ times. Complete this exercise __________ times a day. Shoulder flexion, standing  Stand and hold a broomstick, a cane, or a similar object. Place your hands a little more than shoulder-width apart on the object. Your left / right hand should be palm-up, and your other hand should be palm-down. Keep your elbow straight and your shoulder muscles relaxed. Push the stick up with your healthy arm to raise your left / right arm in front of your body, and then over your head until you feel a stretch in your shoulder (flexion). Avoid shrugging your shoulder  while you raise your arm. Keep your shoulder blade tucked down toward the middle of your back. Hold for __________ seconds. Slowly return to the starting position. Repeat __________ times. Complete this exercise __________ times a day. Shoulder abduction, standing  Stand and hold a broomstick, a cane, or a similar object. Place your hands a little more than  shoulder-width apart on the object. Your left / right hand should be palm-up, and your other hand should be palm-down. Keep your elbow straight and your shoulder muscles relaxed. Push the object across your body toward your left / right side. Raise your left / right arm to the side of your body (abduction) until you feel a stretch in your shoulder. Do not raise your arm above shoulder height unless your health care provider tells you to do that. If directed, raise your arm over your head. Avoid shrugging your shoulder while you raise your arm. Keep your shoulder blade tucked down toward the middle of your back. Hold for __________ seconds. Slowly return to the starting position. Repeat __________ times. Complete this exercise __________ times a day. Internal rotation  Place your left / right hand behind your back, palm-up. Use your other hand to dangle an exercise band, a broomstick, or a similar object over your shoulder. Grasp the band with your left / right hand so you are holding on to both ends. Gently pull up on the band until you feel a stretch in the front of your left / right shoulder. The movement of your arm toward the center of your body is called internal rotation. Avoid shrugging your shoulder while you raise your arm. Keep your shoulder blade tucked down toward the middle of your back. Hold for __________ seconds. Release the stretch by letting go of the band and lowering your hands. Repeat __________ times. Complete this exercise __________ times a day. Strengthening exercises External rotation  Sit in a stable chair without armrests. Secure an exercise band to a stable object at elbow height on your left / right side. Place a soft object, such as a folded towel or a small pillow, between your left / right upper arm and your body to move your elbow about 4 inches (10 cm) away from your side. Hold the end of the exercise band so it is tight and there is no slack. Keeping your  elbow pressed against the soft object, slowly move your forearm out, away from your abdomen (external rotation). Keep your body steady so only your forearm moves. Hold for __________ seconds. Slowly return to the starting position. Repeat __________ times. Complete this exercise __________ times a day. Shoulder abduction  Sit in a stable chair without armrests, or stand up. Hold a __________ lb / kg weight in your left / right hand, or hold an exercise band with both hands. Start with your arms straight down and your left / right palm facing in, toward your body. Slowly lift your left / right hand out to your side (abduction). Do not lift your hand above shoulder height unless your health care provider tells you that this is safe. Keep your arms straight. Avoid shrugging your shoulder while you do this movement. Keep your shoulder blade tucked down toward the middle of your back. Hold for __________ seconds. Slowly lower your arm, and return to the starting position. Repeat __________ times. Complete this exercise __________ times a day. Shoulder extension  Sit in a stable chair without armrests, or stand up. Secure an exercise band to a  stable object in front of you so it is at shoulder height. Hold one end of the exercise band in each hand. Straighten your elbows and lift your hands up to shoulder height. Squeeze your shoulder blades together as you pull your hands down to the sides of your thighs (extension). Stop when your hands are straight down by your sides. Do not let your hands go behind your body. Hold for __________ seconds. Slowly return to the starting position. Repeat __________ times. Complete this exercise __________ times a day. Shoulder row  Sit in a stable chair without armrests, or stand up. Secure an exercise band to a stable object in front of you so it is at chest height. Hold one end of the exercise band in each hand. Position your palms so that your thumbs are  facing the ceiling (neutral position). Bend each of your elbows to a 90-degree angle (right angle) and keep your upper arms at your sides. Step back or move the chair back until the band is tight and there is no slack. Slowly pull your elbows back behind you. Hold for __________ seconds. Slowly return to the starting position. Repeat __________ times. Complete this exercise __________ times a day. Shoulder press-ups  Sit in a stable chair that has armrests. Sit upright, with your feet flat on the floor. Put your hands on the armrests so your elbows are bent and your fingers are pointing forward. Your hands should be about even with the sides of your body. Push down on the armrests and use your arms to lift yourself off the chair. Straighten your elbows and lift yourself up as much as you comfortably can. Move your shoulder blades down, and avoid letting your shoulders move up toward your ears. Keep your feet on the ground. As you get stronger, your feet should support less of your body weight as you lift yourself up. Hold for __________ seconds. Slowly lower yourself back into the chair. Repeat __________ times. Complete this exercise __________ times a day. Wall push-ups  Stand so you are facing a stable wall. Your feet should be about one arm-length away from the wall. Lean forward and place your palms on the wall at shoulder height. Keep your feet flat on the floor as you bend your elbows and lean forward toward the wall. Hold for __________ seconds. Straighten your elbows to push yourself back to the starting position. Repeat __________ times. Complete this exercise __________ times a day. This information is not intended to replace advice given to you by your health care provider. Make sure you discuss any questions you have with your health care provider. Document Revised: 03/04/2021 Document Reviewed: 03/04/2021 [employer] Patient Education  2024 [employer] Hip Exercises Ask  your health care provider which exercises are safe for you. Do exercises exactly as told by your provider and adjust them as told. It is normal to feel mild stretching, pulling, tightness, or discomfort as you do these exercises. Stop right away if you feel sudden pain or your pain gets worse. Do not begin these exercises until told by your provider. Stretching and range-of-motion exercises These exercises warm up your muscles and joints and improve the movement and flexibility of your hip. They also help to relieve pain, numbness, and tingling. You may be asked to limit your range of motion if you had a hip replacement. Talk to your provider about these limits. Hamstrings, supine  Lie on your back (supine position). Loop a belt, towel, or exercise band over  the ball of your left / right foot. The ball of your foot is on the walking surface, right under your toes. Straighten your left / right knee and slowly pull on the belt, towel, or band to raise your leg until you feel a gentle stretch behind your knee (hamstring). Do not let your knee bend while you do this. Keep your other leg flat on the floor. Hold this position for __________ seconds. Slowly return your leg to the starting position. Repeat __________ times. Complete this exercise __________ times a day. Hip rotation  Lie on your back on a firm surface. With your left / right hand, gently pull your left / right knee toward the shoulder that is on the same side of the body. Stop when your knee is pointing toward the ceiling. Hold your left / right ankle with your other hand. Keeping your knee steady, gently pull your left / right ankle toward your other shoulder until you feel a stretch in your butt. Keep your hips and shoulders firmly planted while you do this stretch. Hold this position for __________ seconds. Repeat __________ times. Complete this exercise __________ times a day. Seated stretch This exercise is sometimes called  hamstrings and adductors stretch. Sit on the floor with your legs stretched wide. Keep your knees straight during this exercise. Keeping your head and back in a straight line, bend at your waist to reach for your left foot (position A). You should feel a stretch in your right inner thigh (adductors). Hold this position for __________ seconds. Then slowly return to the upright position. Keeping your head and back in a straight line, bend at your waist to reach forward (position B). You should feel a stretch behind both of your thighs and knees (hamstrings). Hold this position for __________ seconds. Then slowly return to the upright position. Keeping your head and back in a straight line, bend at your waist to reach for your right foot (position C). You should feel a stretch in your left inner thigh (adductors). Hold this position for __________ seconds. Then slowly return to the upright position. Repeat __________ times. Complete this exercise __________ times a day. Lunge This exercise stretches the muscles of the hip (hip flexors). Place your left / right knee on the floor and bend your other knee so that is directly over your ankle. You should be half-kneeling. Keep good posture with your head over your shoulders. Tighten your butt muscles to point your tailbone downward. This will prevent your back from arching too much. You should feel a gentle stretch in the front of your left / right thigh and hip. If you do not feel a stretch, slide your other foot forward slightly and then slowly lunge forward with your chest up until your knee once again lines up over your ankle. Make sure your tailbone continues to point downward. Hold this position for __________ seconds. Slowly return to the starting position. Repeat __________ times. Complete this exercise __________ times a day. Strengthening exercises These exercises build strength and endurance in your hip. Endurance is the ability to use your  muscles for a long time, even after they get tired. Bridge This exercise strengthens the muscles of your hip (hip extensors). Lie on your back on a firm surface with your knees bent and your feet flat on the floor. Tighten your butt muscles and lift your bottom off the floor until the trunk of your body and your hips are level with your thighs. Do not arch  your back. You should feel the muscles working in your butt and the back of your thighs. If you do not feel these muscles, slide your feet 1-2 inches (2.5-5 cm) farther away from your butt. Hold this position for __________ seconds. Slowly lower your hips to the starting position. Let your muscles relax completely between repetitions. Repeat __________ times. Complete this exercise __________ times a day. Straight leg raises, side-lying This exercise strengthens the muscles that move the hip joint away from the center of the body (hip abductors). Lie on your side with your left / right leg in the top position. Lie so your head, shoulder, hip, and knee line up. You may bend your bottom knee slightly to help you balance. Roll your hips slightly forward, so your hips are stacked directly over each other and your left / right knee is facing forward. Leading with your heel, lift your top leg 4-6 inches (10-15 cm). You should feel the muscles in your top hip lifting. Do not let your foot drift forward. Do not let your knee roll toward the ceiling. Hold this position for __________ seconds. Slowly return to the starting position. Let your muscles relax completely between repetitions. Repeat __________ times. Complete this exercise __________ times a day. Straight leg raises, side-lying This exercise strengthens the muscles that move the hip joint toward the center of the body (hip adductors). Lie on your side with your left / right leg in the bottom position. Lie so your head, shoulder, hip, and knee line up. You may place your upper foot in front  to help you balance. Roll your hips slightly forward, so your hips are stacked directly over each other and your left / right knee is facing forward. Tense the muscles in your inner thigh and lift your bottom leg 4-6 inches (10-15 cm). Hold this position for __________ seconds. Slowly return to the starting position. Let your muscles relax completely between repetitions. Repeat __________ times. Complete this exercise __________ times a day. Straight leg raises, supine This exercise strengthens the muscles in the front of your thigh (quadriceps and hip flexors). Lie on your back (supine position) with your left / right leg extended and your other knee bent. Tense the muscles in the front of your left / right thigh. You should see your kneecap slide up or see increased dimpling just above your knee. Keep these muscles tight as you raise your leg 4-6 inches (10-15 cm) off the floor. Do not let your knee bend. Hold this position for __________ seconds. Keep these muscles tense as you lower your leg. Relax the muscles slowly and completely between repetitions. Repeat __________ times. Complete this exercise __________ times a day. Hip abductors, standing This exercise strengthens the muscles that move the leg and hip joint away from the center of the body (hip abductors). Tie one end of a rubber exercise band or tubing to a secure surface, such as a chair, table, or pole. Loop the other end of the band or tubing around your left / right ankle. Keeping your ankle with the band or tubing directly opposite the secured end, step away until there is tension in the tubing or band. Hold on to a chair, table, or pole as needed for balance. Lift your left / right leg out to your side. While you do this: Keep your back upright. Keep your shoulders over your hips. Keep your toes pointing forward. Make sure to use your hip muscles to slowly lift your leg. Do not  tip your body or forcefully lift your  leg. Hold this position for __________ seconds. Slowly return to the starting position. Repeat __________ times. Complete this exercise __________ times a day. Squats This exercise strengthens the muscles in the front of your thigh (quadriceps). Stand in front of a table, or stand in a doorframe so your feet and knees are in line with the frame. You may place your hands on the table or frame for balance. Slowly bend your knees and lower your hips like you are going to sit in a chair. Keep your lower legs in a straight up-and-down position. Do not let your hips go lower than your knees. Do not bend your knees lower than told by your provider. If your hip pain increases, do not bend as low. Hold this position for ___________ seconds. Slowly push with your legs to return to standing. Do not use your hands to pull yourself to standing. Repeat __________ times. Complete this exercise __________ times a day. This information is not intended to replace advice given to you by your health care provider. Make sure you discuss any questions you have with your health care provider. Document Revised: 09/16/2021 Document Reviewed: 09/16/2021 Elsevier Patient Education  2024 [employer].

## 2023-04-16 LAB — RHEUMATOID FACTOR: Rheumatoid fact SerPl-aCnc: <10 [IU]/mL (ref <14)

## 2023-04-16 LAB — ANA: Anti Nuclear Antibody (ANA): NEGATIVE

## 2023-04-16 LAB — PROTEIN ELECTROPHORESIS, SERUM, WITH REFLEX
Albumin ELP: 4.1 g/dL (ref 3.8–4.8)
Alpha 1: 0.3 g/dL (ref 0.2–0.3)
Alpha 2: 0.9 g/dL (ref 0.5–0.9)
Beta 2: 0.5 g/dL (ref 0.2–0.5)
Beta Globulin: 0.5 g/dL (ref 0.4–0.6)
Gamma Globulin: 1.1 g/dL (ref 0.8–1.7)
Total Protein: 7.4 g/dL (ref 6.1–8.1)

## 2023-04-16 LAB — C3 AND C4
C3 Complement: 194 mg/dL — ABNORMAL HIGH (ref 83–193)
C4 Complement: 38 mg/dL (ref 15–57)

## 2023-04-16 LAB — ANTI-SMITH ANTIBODY: ENA SM Ab Ser-aCnc: <1 AI

## 2023-04-16 LAB — SJOGRENS SYNDROME-B EXTRACTABLE NUCLEAR ANTIBODY: SSB (La) (ENA) Antibody, IgG: <1 AI

## 2023-04-16 LAB — TSH: TSH: 1.51 m[IU]/L

## 2023-04-16 LAB — CK: Total CK: 43 U/L (ref 20–239)

## 2023-04-16 LAB — ANTI-DNA ANTIBODY, DOUBLE-STRANDED: ds DNA Ab: <1 [IU]/mL

## 2023-04-16 LAB — SEDIMENTATION RATE: Sed Rate: 31 mm/h — ABNORMAL HIGH (ref 0–20)

## 2023-04-16 LAB — RNP ANTIBODY: Ribonucleic Protein(ENA) Antibody, IgG: <1 AI

## 2023-04-16 LAB — CYCLIC CITRUL PEPTIDE ANTIBODY, IGG: Cyclic Citrullin Peptide Ab: <16 U

## 2023-04-16 LAB — SJOGRENS SYNDROME-A EXTRACTABLE NUCLEAR ANTIBODY: SSA (Ro) (ENA) Antibody, IgG: <1 AI

## 2023-04-16 LAB — ANTI-SCLERODERMA ANTIBODY: Scleroderma (Scl-70) (ENA) Antibody, IgG: <1 AI

## 2023-04-18 ENCOUNTER — Telehealth: Admitting: Physician Assistant

## 2023-04-18 DIAGNOSIS — J0100 Acute maxillary sinusitis, unspecified: Secondary | ICD-10-CM

## 2023-04-18 MED ORDER — AMOXICILLIN-POT CLAVULANATE 875-125 MG PO TABS: 1.0000 | ORAL_TABLET | Freq: Two times a day (BID) | ORAL | 0 refills | Status: DC

## 2023-04-18 NOTE — Progress Notes (Signed)
E-Visit for Sinus Problems  We are sorry that you are not feeling well.  Here is how we plan to help!  Based on what you have shared with me it looks like you have sinusitis.  Sinusitis is inflammation and infection in the sinus cavities of the head.  Based on your presentation I believe you most likely have Acute Bacterial Sinusitis.  This is an infection caused by bacteria and is treated with antibiotics. I have prescribed Augmentin 875mg/125mg one tablet twice daily with food, for 7 days. You may use an oral decongestant such as Mucinex D or if you have glaucoma or high blood pressure use plain Mucinex. Saline nasal spray help and can safely be used as often as needed for congestion.  If you develop worsening sinus pain, fever or notice severe headache and vision changes, or if symptoms are not better after completion of antibiotic, please schedule an appointment with a health care provider.    Sinus infections are not as easily transmitted as other respiratory infection, however we still recommend that you avoid close contact with loved ones, especially the very [place] and elderly.  Remember to wash your hands thoroughly throughout the day as this is the number one way to prevent the spread of infection!  Home Care: Only take medications as instructed by your medical team. Complete the entire course of an antibiotic. Do not take these medications with alcohol. A steam or ultrasonic humidifier can help congestion.  You can place a towel over your head and breathe in the steam from hot water coming from a faucet. Avoid close contacts especially the very [place] and the elderly. Cover your mouth when you cough or sneeze. Always remember to wash your hands.  Get Help Right Away If: You develop worsening fever or sinus pain. You develop a severe head ache or visual changes. Your symptoms persist after you have completed your treatment plan.  Make sure you Understand these instructions. Will watch  your condition. Will get help right away if you are not doing well or get worse.  Thank you for choosing an e-visit.  Your e-visit answers were reviewed by a board certified advanced clinical practitioner to complete your personal care plan. Depending upon the condition, your plan could have included both over the counter or prescription medications.  Please review your pharmacy choice. Make sure the pharmacy is open so you can pick up prescription now. If there is a problem, you may contact your provider through MyChart messaging and have the prescription routed to another pharmacy.  Your safety is important to us. If you have drug allergies check your prescription carefully.   For the next 24 hours you can use MyChart to ask questions about today's visit, request a non-urgent call back, or ask for a work or school excuse. You will get an email in the next two days asking about your experience. I hope that your e-visit has been valuable and will speed your recovery.   I have spent 5 minutes in review of e-visit questionnaire, review and updating patient chart, medical decision making and response to patient.   Xoey Warmoth Z Ward, PA-C    

## 2023-04-18 NOTE — Progress Notes (Signed)
 SPEP normal, ANA negative, ENA panel normal, complements normal, sed rate mildly elevated.  CK normal TSH normal, rheumatoid factor and anti-CCP negative.  I will discuss results at the follow-up visit.

## 2023-04-19 ENCOUNTER — Ambulatory Visit (INDEPENDENT_AMBULATORY_CARE_PROVIDER_SITE_OTHER): Admitting: Student

## 2023-04-19 DIAGNOSIS — Z9889 Other specified postprocedural states: Secondary | ICD-10-CM

## 2023-04-19 DIAGNOSIS — N644 Mastodynia: Secondary | ICD-10-CM

## 2023-04-19 NOTE — Progress Notes (Signed)
 Referring Provider Willow Ora, MD [address] [place],  [place] [address]   CC: Follow-up     Monica Benjamin is an 43 y.o. female.  HPI: Patient is a 43 year old female who underwent bilateral breast reduction with lateral liposuction with Dr. Ulice Bold on 12/31/2021.  She presents to the clinic today with concerns about breast pain.  Today, patient reports she is doing okay.  She states that about 6 months ago, she notices that she has pain and puffiness to her lateral breasts at the end of the day.  She also states that she has some arm weakness bilaterally, more so on the left arm than the right.  She denies any new headaches, changes in vision, chest pain, shortness of breath.  She denies being unable to hold up her arms.  She denies any significant decrease in her range of motion.  Patient also reports she has a small bump noted to her left lateral breast that is bothering her.  She also reports that she has some sensitivities to her incision which can make it difficult to find a bra that fits comfortably.  Patient states that she has not had a mammogram since surgery.  She reports that her weight has stayed the same since surgery.  She reports that she has been following up with her PCP and states that she recently had a lab work indicating some inflammatory process going on.  She states that it has not been diagnosed as something specific yet though.  Review of Systems General: Denies any headaches, changes in vision, chest pain or shortness of breath  Physical Exam    04/14/2023   10:30 AM 04/14/2023   10:21 AM 04/05/2023    2:52 PM  Vitals with BMI  Height  5\' 4"  5\' 4"   Weight  220 lbs 221 lbs 3 oz  BMI  37.74 37.95  Systolic 128 129 846  Diastolic 90 90 84  Pulse 72 66 67    General:  No acute distress,  Alert and oriented, Non-Toxic, Normal speech and affect Chaperone present on exam.  On exam, patient is sitting upright in no acute distress.  Breasts are  soft and symmetric.  There is no overlying erythema.  There are no fluid collections palpated on exam.  There are no fluid collections palpated out laterally.  There are no overlying skin changes to the lateral breasts.  There is some very mild tenderness to deep palpation to the lateral breasts bilaterally.  NAC's are healthy bilaterally.  Incisions are well-healed.  There does not here to be what looks like a small dogear noted to the left lateral incision.  There are no signs of infection on exam.  Assessment/Plan  S/P bilateral breast reduction - Plan: Ambulatory referral to Physical Therapy   Discussed with patient that there are no obvious signs of fluid collection or infection.  Discussed with her I did not feel any lumps, bumps or masses on exam.  I recommended that she start with physical therapy to see if this might help with some of her pain and weakness.  I recommended that she also continue to follow-up with her primary care provider in regards to some of these issues that she is having.  Patient expressed understanding and was in agreement.  I recommended that patient gently massage her incisions.  Discussed with her that it could be nerve pain.  Discussed with her that if it bothers her and persists, she may consider following up  with her PCP and starting a medication such as gabapentin to help with the pain.  Patient expressed understanding.  Recommended that patient follow-up with Dr. Ulice Bold in regards to left lateral incision to discuss possibility of further procedures to address this issue.  Patient expressed understanding.  Instructed the patient to call if she has any questions or concerns in the meantime.  Pictures were obtained of the patient and placed in the chart with the patient's or guardian's permission.   Laurena Spies 04/19/2023, 12:01 PM

## 2023-04-20 ENCOUNTER — Other Ambulatory Visit: Payer: Self-pay | Admitting: Obstetrics and Gynecology

## 2023-04-20 DIAGNOSIS — Z1231 Encounter for screening mammogram for malignant neoplasm of breast: Secondary | ICD-10-CM

## 2023-04-26 ENCOUNTER — Ambulatory Visit (INDEPENDENT_AMBULATORY_CARE_PROVIDER_SITE_OTHER): Admitting: Physical Therapy

## 2023-04-26 DIAGNOSIS — M5412 Radiculopathy, cervical region: Secondary | ICD-10-CM | POA: Diagnosis not present

## 2023-04-26 DIAGNOSIS — M542 Cervicalgia: Secondary | ICD-10-CM

## 2023-04-26 NOTE — Therapy (Signed)
 OUTPATIENT PHYSICAL THERAPY UPPER EXTREMITY EVALUATION   Patient Name: Monica Benjamin MRN: [id number] DOB:February 01, 1980, 43 y.o., female Today's Date: 05/03/2023  END OF SESSION:  PT End of Session - 05/03/23 1547     Visit Number 1    Number of Visits 16    Date for PT Re-Evaluation 06/21/23    Authorization Type [facility]/save/[employer]    PT Start Time 1604    PT Stop Time 1644    PT Time Calculation (min) 40 min    Activity Tolerance Patient tolerated treatment well    Behavior During Therapy [facility] for tasks assessed/performed             Past Medical History:  Diagnosis Date   Allergy    Anxiety    Asthma    Back pain    Depression    GERD (gastroesophageal reflux disease)    Hyperlipidemia    Hypertension    IBS (irritable bowel syndrome)    Infertility, female 02/15/2017   IVF treatment   Lumbar herniated disc    L4-L5   Migraines    Other fatigue    Shortness of breath on exertion    Sleep apnea    Trigeminal neuralgia    Vitamin D deficiency 01/26/2021   Past Surgical History:  Procedure Laterality Date   BREAST REDUCTION SURGERY Bilateral 12/31/2021   Procedure: BREAST REDUCTION WITH LIPOSUCTION;  Surgeon: Peggye Form, DO;  Location: [facility];  Service: Plastics;  Laterality: Bilateral;   WISDOM TOOTH EXTRACTION     Patient Active Problem List   Diagnosis Date Noted   Vitamin B12 deficiency 01/30/2023   Hamartoma (HCC) 10/12/2022   Perimenopausal symptoms 10/12/2022   Vitamin D deficiency 02/26/2021   NAFLD (nonalcoholic fatty liver disease) 40/98/1191   Mild episode of recurrent major depressive disorder (HCC) 05/10/2018   Hypertriglyceridemia 05/10/2018   Essential hypertension 03/16/2018   Infertility, female 02/15/2017   History of obstructive sleep apnea 05/25/2016   Chronic seasonal allergic rhinitis due to pollen 11/25/2015   GAD (generalized anxiety disorder) 11/25/2015   Gastroesophageal reflux disease without  esophagitis 11/25/2015   Irritable bowel syndrome with both constipation and diarrhea 11/25/2015   Moderate persistent asthma without complication 11/25/2015    PCP: Asencion Partridge  REFERRING PROVIDER: Asencion Partridge  REFERRING DIAG: Neck pain/ S/P breast reduction  THERAPY DIAG:  Cervicalgia  Radiculopathy, cervical region  Rationale for Evaluation and Treatment: Rehabilitation  ONSET DATE: 39mo-1 yr ago  SUBJECTIVE:  SUBJECTIVE STATEMENT: Pt states  Increased neck pain for about 6 mo-1 yr. Pain on  R side of neck , R UT. Both arms feel heavy, more on the L.   Neck pain feels constant. Hurts more in AM. Pt works at desk, tries to walk 3x/wk.  Sleeps at least 6 hrs/night,   Hand dominance: Right  PERTINENT HISTORY: s/p breast reduction, back pain,    PAIN:  Are you having pain? Yes: NPRS scale: 4-5/10 Pain location: R side of neck, into arms. Pain description: sore , heavy Aggravating factors: most movement Relieving factors: none stated    PRECAUTIONS: None  RED FLAGS: None   WEIGHT BEARING RESTRICTIONS: No   FALLS:  Has patient fallen in last 6 months? No   PLOF: Independent  PATIENT GOALS: Decreased pain in neck and arms.   NEXT MD VISIT:   OBJECTIVE:   DIAGNOSTIC FINDINGS:    PATIENT SURVEYS :    COGNITION: Overall cognitive status: Within functional limits for tasks assessed     SENSATION: WFL  POSTURE: tendency for fwd head, and posterior pelvic tilt in sitting.    UPPER EXTREMITY ROM:  Shoulders: WFL Neck: rotation/ mild limitation/ pain ,   flex/ mild soreness,  ext/mild soreness,    UPPER EXTREMITY MMT:  MMT Right eval Left eval  Shoulder flexion 4 4  Shoulder extension    Shoulder abduction 4 4  Shoulder adduction    Shoulder internal rotation     Shoulder external rotation    Middle trapezius    Lower trapezius    Elbow flexion    Elbow extension    Wrist flexion    Wrist extension    Wrist ulnar deviation    Wrist radial deviation    Wrist pronation    Wrist supination    Grip strength (lbs)    (Blank rows = not tested)  SHOULDER SPECIAL TESTS:   JOINT MOBILITY TESTING:  wfl   PALPATION: tenderness in R UT, R scalenes, into 1st rib,   LAD: relieving for shoulders.      TODAY'S TREATMENT:                                                                                                                                         DATE:  Eval: see below for HEP Manual: LAD for shoulders bil   PATIENT EDUCATION:  Education details: PT POC, Exam findings, HEP Person educated: Patient Education method: Explanation, Demonstration, Tactile cues, Verbal cues, and Handouts Education comprehension: verbalized understanding, returned demonstration, verbal cues required, tactile cues required, and needs further education   HOME EXERCISE PROGRAM: Access Code: [email or web address] URL: [email or web address] Date: 05/03/2023 Prepared by: Sedalia Muta  Exercises - Seated Cervical Sidebending Stretch  - 2 x daily - 3 reps - 15 hold - Seated Levator Scapulae Stretch  - 2 x daily - 3 reps - 20 hold - Seated  Cervical Rotation AROM  - 2 x daily - 1 sets - 5 reps - Seated Shoulder Rolls  - 2 x daily - 1 sets - 5-10 reps  ASSESSMENT:  CLINICAL IMPRESSION: Eval:  Patient presents with primary complaint of pain in neck and heaviness into arms. She has increased pain in R side of neck and musculature. She has decreased posture with fwd head. She has ongoing pain with lack of effective HEP. Pt with decreased ability for full functional activities, reaching, lifting, carrying, and IADLs. Pt to benefit from skilled PT to improve deficits and pain.    OBJECTIVE IMPAIRMENTS: decreased activity tolerance, decreased mobility, decreased  ROM, decreased strength, increased muscle spasms, impaired flexibility, improper body mechanics, and pain.   ACTIVITY LIMITATIONS: carrying, lifting, dressing, reach over head, hygiene/grooming, and locomotion level  PARTICIPATION LIMITATIONS: meal prep, cleaning, laundry, driving, shopping, community activity, occupation, and yard work  PERSONAL FACTORS: Past/current experiences and Time since onset of injury/illness/exacerbation are also affecting patient's functional outcome.   REHAB POTENTIAL: Good  CLINICAL DECISION MAKING: Stable/uncomplicated  EVALUATION COMPLEXITY: Low  GOALS: Goals reviewed with patient? Yes   SHORT TERM GOALS: Target date: 05/10/2023  Pt to be intendment with initial HEP  Goal status: INITIAL   LONG TERM GOALS: Target date: 06/21/2023   Pt to be independent with final HEP  Goal status: INITIAL  2.  Pt to demo improved cervical ROM to be [facility] and pain free, to improve ability for ADLs.   Goal status: INITIAL  3.  Pt to demo improved shoulder and postural strength to be at least 4+/5, to improve stability and pain.  Goal status: INITIAL  4.  Pt to report decreased pain 0-2/10 during work day.   Goal status: INITIAL  5. Pt to report at least 75 % improvement in overall symptoms in arms and neck.    Goal status: INITIAL    PLAN: PT FREQUENCY: 1-2x/week  PT DURATION: 8 weeks  PLANNED INTERVENTIONS: Therapeutic exercises, Therapeutic activity, Neuromuscular re-education, Patient/Family education, Self Care, Joint mobilization, Joint manipulation, Stair training, DME instructions, Aquatic Therapy, Dry Needling, Electrical stimulation, Cryotherapy, Moist heat, Taping, Ultrasound, Ionotophoresis 4mg /ml Dexamethasone, Manual therapy,  Vasopneumatic device, Traction, Spinal manipulation, Spinal mobilization,   PLAN FOR NEXT SESSION:    Sedalia Muta, PT, DPT 3:48 PM  05/03/23

## 2023-05-03 ENCOUNTER — Other Ambulatory Visit: Payer: Self-pay

## 2023-05-03 ENCOUNTER — Encounter: Payer: Self-pay | Admitting: Physical Therapy

## 2023-05-03 ENCOUNTER — Other Ambulatory Visit (HOSPITAL_COMMUNITY): Payer: Self-pay

## 2023-05-03 ENCOUNTER — Encounter: Payer: Self-pay | Admitting: Family Medicine

## 2023-05-03 MED ORDER — BUPROPION HCL ER (XL) 150 MG PO TB24
150.0000 mg | ORAL_TABLET | Freq: Every day | ORAL | 3 refills | Status: DC
  Filled 2023-05-03: qty 90, 90d supply, fill #0

## 2023-05-04 ENCOUNTER — Other Ambulatory Visit (HOSPITAL_COMMUNITY): Payer: Self-pay

## 2023-05-04 ENCOUNTER — Ambulatory Visit
Admission: RE | Admit: 2023-05-04 | Discharge: 2023-05-04 | Disposition: A | Discharge status: Home / Self Care | Source: Ambulatory Visit | Attending: Obstetrics and Gynecology | Admitting: Obstetrics and Gynecology

## 2023-05-04 DIAGNOSIS — Z1231 Encounter for screening mammogram for malignant neoplasm of breast: Secondary | ICD-10-CM

## 2023-05-04 MED ORDER — BUPROPION HCL ER (XL) 150 MG PO TB24
150.0000 mg | ORAL_TABLET | Freq: Every day | ORAL | 3 refills | Status: DC
Start: 2023-04-05 — End: 2023-05-19
  Filled 2023-05-04: qty 90, 90d supply, fill #0

## 2023-05-05 NOTE — Progress Notes (Signed)
 Office Visit Note  Patient: Monica Benjamin             Date of Birth: 1980/03/19           MRN: [id number]             PCP: Luevenia Saha, MD Referring: Luevenia Saha, MD Visit Date: 05/19/2023 Occupation: @GUAROCC @  Subjective:  Pain in multiple joints  History of Present Illness: Monica Benjamin is a 43 y.o. female with positive ANA and polyarthralgia.  She returns today after initial visit on April 14, 2023.  She states she has been going to physical therapy for shoulder joint discomfort and neck discomfort which is helping.  She continues to have discomfort in her lower back and her hip joints.  She continues to have some stiffness in her hands.  She has not noticed any joint swelling.  She continues to have some generalized pain and discomfort from fibromyalgia.    Activities of Daily Living:  Patient reports morning stiffness for 0 minutes.   Patient Reports nocturnal pain.  Difficulty dressing/grooming: Denies Difficulty climbing stairs: Denies Difficulty getting out of chair: Denies Difficulty using hands for taps, buttons, cutlery, and/or writing: Denies  Review of Systems  Constitutional:  Positive for fatigue.  HENT:  Positive for mouth dryness. Negative for mouth sores.   Eyes:  Negative for dryness.  Respiratory:  Negative for shortness of breath and difficulty breathing.   Cardiovascular:  Negative for chest pain and palpitations.  Gastrointestinal:  Positive for diarrhea. Negative for blood in stool and constipation.  Endocrine: Negative for increased urination.  Genitourinary:  Negative for involuntary urination.  Musculoskeletal:  Positive for myalgias, muscle weakness, muscle tenderness and myalgias. Negative for joint pain, gait problem, joint pain, joint swelling and morning stiffness.  Skin:  Positive for sensitivity to sunlight. Negative for color change, rash and hair loss.  Allergic/Immunologic: Positive for susceptible to infections.   Neurological:  Negative for dizziness and headaches.  Hematological:  Negative for swollen glands.  Psychiatric/Behavioral:  Positive for depressed mood and sleep disturbance. The patient is nervous/anxious.     PMFS History:  Patient Active Problem List   Diagnosis Date Noted   Vitamin B12 deficiency 01/30/2023   Hamartoma (HCC) 10/12/2022   Perimenopausal symptoms 10/12/2022   Vitamin D  deficiency 02/26/2021   NAFLD (nonalcoholic fatty liver disease) 35/57/3220   Mild episode of recurrent major depressive disorder (HCC) 05/10/2018   Hypertriglyceridemia 05/10/2018   Essential hypertension 03/16/2018   Infertility, female 02/15/2017   History of obstructive sleep apnea 05/25/2016   Chronic seasonal allergic rhinitis due to pollen 11/25/2015   GAD (generalized anxiety disorder) 11/25/2015   Gastroesophageal reflux disease without esophagitis 11/25/2015   Irritable bowel syndrome with both constipation and diarrhea 11/25/2015   Moderate persistent asthma without complication 11/25/2015    Past Medical History:  Diagnosis Date   Allergy     Anxiety    Asthma    Back pain    Depression    GERD (gastroesophageal reflux disease)    Hyperlipidemia    Hypertension    IBS (irritable bowel syndrome)    Infertility, female 02/15/2017   IVF treatment   Lumbar herniated disc    L4-L5   Migraines    Other fatigue    Shortness of breath on exertion    Sleep apnea    Trigeminal neuralgia    Vitamin D  deficiency 01/26/2021    Family History  Problem Relation Age of Onset  Anxiety disorder Mother    Osteoporosis Mother    Migraines Mother    Alcoholism Father    Hyperlipidemia Father    Hypertension Father    Migraines Sister    Dementia Maternal Grandmother    Diabetes Maternal Grandfather    COPD Paternal Grandmother    Anxiety disorder Other    Alcoholism Other    Cancer Neg Hx    Heart disease Neg Hx    Past Surgical History:  Procedure Laterality Date   BREAST  REDUCTION SURGERY Bilateral 12/31/2021   Procedure: BREAST REDUCTION WITH LIPOSUCTION;  Surgeon: Thornell Flirt, DO;  Location: [facility];  Service: Plastics;  Laterality: Bilateral;   WISDOM TOOTH EXTRACTION     Social History   Social History Narrative   Married to [employer] History  Administered Date(s) Administered   HPV Quadrivalent 10/09/2004, 12/17/2004, 04/29/2005   Influenza, Seasonal, Injecte, Preservative Fre 10/28/2015   Influenza,inj,Quad PF,6+ Mos 10/28/2015, 10/27/2019   Influenza,inj,quad, With Preservative 10/27/2018   Influenza-Unspecified 10/07/2016, 11/08/2020, 11/12/2021   PFIZER(Purple Top)SARS-COV-2 Vaccination 02/10/2019, 03/03/2019   Tdap 10/12/2006, 10/31/2013     Objective: Vital Signs: BP 117/77 (BP Location: Left Arm, Patient Position: Sitting, Cuff Size: Large)   Pulse 69   Resp 15   Ht 5\' 4"  (1.626 m)   Wt 224 lb (101.6 kg)   BMI 38.45 kg/m    Physical Exam Vitals and nursing note reviewed.  Constitutional:      Appearance: She is well-developed.  HENT:     Head: Normocephalic and atraumatic.  Eyes:     Conjunctiva/sclera: Conjunctivae normal.  Cardiovascular:     Rate and Rhythm: Normal rate and regular rhythm.     Heart sounds: Normal heart sounds.  Pulmonary:     Effort: Pulmonary effort is normal.     Breath sounds: Normal breath sounds.  Abdominal:     General: Bowel sounds are normal.     Palpations: Abdomen is soft.  Musculoskeletal:     Cervical back: Normal range of motion.  Lymphadenopathy:     Cervical: No cervical adenopathy.  Skin:    General: Skin is warm and dry.     Capillary Refill: Capillary refill takes less than 2 seconds.  Neurological:     Mental Status: She is alert and oriented to person, place, and time.  Psychiatric:        Behavior: Behavior normal.      Musculoskeletal Exam: Cervical, thoracic and lumbar spine were in good range of motion.  She had discomfort range of  motion of her lumbar spine.  Shoulders, elbows, wrist, MCPs PIPs and DIPs Juengel range of motion.  No synovitis was noted.  Hip joints were in good range of motion.  She has some discomfort range of motion of her hip joints.  Knee joints in good range of motion without any warmth swelling or effusion.  There was no tenderness over ankles or MTPs.  CDAI Exam: CDAI Score: -- Patient Global: --; Provider Global: -- Swollen: --; Tender: -- Joint Exam 05/19/2023   No joint exam has been documented for this visit   There is currently no information documented on the homunculus. Go to the Rheumatology activity and complete the homunculus joint exam.  Investigation: No additional findings.  Imaging: MM 3D SCREENING MAMMOGRAM BILATERAL BREAST Result Date: 05/06/2023 CLINICAL DATA:  Screening. EXAM: DIGITAL SCREENING BILATERAL MAMMOGRAM WITH TOMOSYNTHESIS AND CAD TECHNIQUE: Bilateral screening digital craniocaudal and mediolateral oblique mammograms were obtained.  Bilateral screening digital breast tomosynthesis was performed. The images were evaluated with computer-aided detection. COMPARISON:  Previous exam(s). ACR Breast Density Category c: The breasts are heterogeneously dense, which may obscure small masses. FINDINGS: In the right breast, a possible asymmetry warrants further evaluation. In the left breast, no findings suspicious for malignancy. IMPRESSION: Further evaluation is suggested for possible asymmetry in the right breast. RECOMMENDATION: Diagnostic mammogram and possibly ultrasound of the right breast. (Code:[id number]) The patient will be contacted regarding the findings, and additional imaging will be scheduled. BI-RADS CATEGORY  0: Incomplete: Need additional imaging evaluation. Electronically Signed   By: Sundra Engel M.D.   On: 05/06/2023 13:23    Recent Labs: Lab Results  Component Value Date   WBC 9.9 04/05/2023   HGB 14.3 04/05/2023   PLT 305.0 04/05/2023   NA 138 04/05/2023   K  3.9 04/05/2023   CL 102 04/05/2023   CO2 27 04/05/2023   GLUCOSE 101 (H) 04/05/2023   BUN 9 04/05/2023   CREATININE 0.73 04/05/2023   BILITOT 0.4 04/05/2023   ALKPHOS 96 04/05/2023   AST 75 (H) 04/05/2023   ALT 58 (H) 04/05/2023   PROT 7.4 04/14/2023   ALBUMIN 4.2 04/05/2023   CALCIUM 9.7 04/05/2023   GFRAA 100 11/29/2019       Speciality Comments: No specialty comments available.  Procedures:  No procedures performed Allergies: Ace inhibitors, Nsaids, and Prednisone    Assessment / Plan:     Visit Diagnoses: Polyarthralgia - History of pain in multiple joints.  All autoimmune workup negative.  Sed rate is mildly elevated.  Lab results were discussed with the patient at length.  She continues to have some joint pain involving neck, lower back, shoulders, hips and her hands.  She also has generalized pain and discomfort in her muscles.  Patient will contact me if she develops any increased joint pain or joint swelling.  Positive ANA (antinuclear antibody) - April 14, 2023 ANA negative, ENA (SCL 70, RNP, Smith, SSA, SSB, dsDNA) negative, C3-C4 normal, RF negative, anti-CCP negative, TSH normal, CK 43, sed rate 31, SPEP normal: Repeat ANA negative, ENA panel negative.  Patient gives history of dry mouth, arthralgias and photosensitivity.  Lab results were discussed with the patient at length.  Patient voiced understanding.  Chronic pain of both shoulders -she continues to have some discomfort in her shoulders.  She has been going to physical therapy.  A handout on exercises was given at the last visit.  Chronic pain of both hips -she complains of discomfort in her hip joints.  She has some discomfort with range of motion of her hips.  A handout on exercises was placed in the AVS.  Weight loss diet and exercise was emphasized.  Neck pain - Patient is followed at [facility]  I reviewed the x-ray report today from January 01, 2023 which showed mild C5-C6 spondylosis.  Pain in thoracic  spine - Improvement noted after the breast reduction surgery per patient.  Lumbar spondylosis - Previous x-rays showed degenerative changes.  She continues to have lumbar spine discomfort.  A handout on back exercises was given and some exercises were demonstrated in the office.  Fibromyalgia - Generalized pain, hyperalgesia and positive tender points.  Patient declined referral to integrative therapies at the last visit.  CK was normal.  Benefits of water aerobics, stretching concerning were discussed.  Other fatigue-related to fibromyalgia.  Primary insomnia-sleep hygiene was discussed.  Other medical problems are listed as follows:  Hypertriglyceridemia  Essential hypertension  Vitamin D  deficiency  NAFLD (nonalcoholic fatty liver disease)  Irritable bowel syndrome with both constipation and diarrhea  Gastroesophageal reflux disease without esophagitis  Chronic seasonal allergic rhinitis due to pollen  Moderate persistent asthma without complication  Mild episode of recurrent major depressive disorder (HCC)  GAD (generalized anxiety disorder)  Orders: No orders of the defined types were placed in this encounter.  No orders of the defined types were placed in this encounter.    Follow-Up Instructions: Return if symptoms worsen or fail to improve, for Osteoarthritis.   Nicholas Bari, MD  Note - This record has been created using Animal nutritionist.  Chart creation errors have been sought, but may not always  have been located. Such creation errors do not reflect on  the standard of medical care.

## 2023-05-06 ENCOUNTER — Encounter: Payer: Self-pay | Admitting: Obstetrics and Gynecology

## 2023-05-06 DIAGNOSIS — F321 Major depressive disorder, single episode, moderate: Secondary | ICD-10-CM | POA: Diagnosis not present

## 2023-05-07 ENCOUNTER — Other Ambulatory Visit: Payer: Self-pay | Admitting: Obstetrics and Gynecology

## 2023-05-07 DIAGNOSIS — R928 Other abnormal and inconclusive findings on diagnostic imaging of breast: Secondary | ICD-10-CM

## 2023-05-13 ENCOUNTER — Ambulatory Visit (INDEPENDENT_AMBULATORY_CARE_PROVIDER_SITE_OTHER): Admitting: Physical Therapy

## 2023-05-13 ENCOUNTER — Encounter: Payer: Self-pay | Admitting: Physical Therapy

## 2023-05-13 DIAGNOSIS — M5412 Radiculopathy, cervical region: Secondary | ICD-10-CM | POA: Diagnosis not present

## 2023-05-13 DIAGNOSIS — M542 Cervicalgia: Secondary | ICD-10-CM | POA: Diagnosis not present

## 2023-05-13 NOTE — Therapy (Signed)
 OUTPATIENT PHYSICAL THERAPY UPPER EXTREMITY TREATMENT   Patient Name: Monica Benjamin MRN: [id number] DOB:Jan 03, 1981, 43 y.o., female Today's Date: 05/13/2023  END OF SESSION:  PT End of Session - 05/13/23 0801     Visit Number 2    Number of Visits 16    Date for PT Re-Evaluation 06/21/23    Authorization Type [facility]/[employer]    PT Start Time 0803    PT Stop Time 0843    PT Time Calculation (min) 40 min    Activity Tolerance Patient tolerated treatment well    Behavior During Therapy [facility] for tasks assessed/performed             Past Medical History:  Diagnosis Date   Allergy    Anxiety    Asthma    Back pain    Depression    GERD (gastroesophageal reflux disease)    Hyperlipidemia    Hypertension    IBS (irritable bowel syndrome)    Infertility, female 02/15/2017   IVF treatment   Lumbar herniated disc    L4-L5   Migraines    Other fatigue    Shortness of breath on exertion    Sleep apnea    Trigeminal neuralgia    Vitamin D deficiency 01/26/2021   Past Surgical History:  Procedure Laterality Date   BREAST REDUCTION SURGERY Bilateral 12/31/2021   Procedure: BREAST REDUCTION WITH LIPOSUCTION;  Surgeon: Thornell Flirt, DO;  Location: [facility];  Service: Plastics;  Laterality: Bilateral;   WISDOM TOOTH EXTRACTION     Patient Active Problem List   Diagnosis Date Noted   Vitamin B12 deficiency 01/30/2023   Ham[place]oma (HCC) 10/12/2022   Perimenopausal symptoms 10/12/2022   Vitamin D deficiency 02/26/2021   NAFLD (nonalcoholic fatty liver disease) 78/29/5621   Mild episode of recurrent major depressive disorder (HCC) 05/10/2018   Hypertriglyceridemia 05/10/2018   Essential hypertension 03/16/2018   Infertility, female 02/15/2017   History of obstructive sleep apnea 05/25/2016   Chronic seasonal allergic rhinitis due to pollen 11/25/2015   GAD (generalized anxiety disorder) 11/25/2015   Gastroesophageal reflux disease without  esophagitis 11/25/2015   Irritable bowel syndrome with both constipation and diarrhea 11/25/2015   Moderate persistent asthma without complication 11/25/2015    PCP: Karma Oz  REFERRING PROVIDER: Karma Oz  REFERRING DIAG: Neck pain/ S/P breast reduction  THERAPY DIAG:  Cervicalgia  Radiculopathy, cervical region  Rationale for Evaluation and Treatment: Rehabilitation  ONSET DATE: 60mo-1 yr ago  SUBJECTIVE:  SUBJECTIVE STATEMENT: Pt states improving heaviness/sensation in UEs. Still has tightness/soreness in R side of neck, feeling it this am, but notes that it has been a bit better since last visit. Has been doing HEP .  Eval: Pt states  Increased neck pain for about 6 mo-1 yr. Pain on  R side of neck , R UT. Both arms feel heavy, more on the L.   Neck pain feels constant. Hurts more in AM. Pt works at desk, tries to walk 3x/wk.  Sleeps at least 6 hrs/night,   Hand dominance: Right  PERTINENT HISTORY: s/p breast reduction, back pain,    PAIN:  Are you having pain? Yes: NPRS scale: 4-5/10 Pain location: R side of neck, into arms. Pain description: sore , heavy Aggravating factors: most movement Relieving factors: none stated    PRECAUTIONS: None  RED FLAGS: None   WEIGHT BEARING RESTRICTIONS: No   FALLS:  Has patient fallen in last 6 months? No   PLOF: Independent  PATIENT GOALS: Decreased pain in neck and arms.   NEXT MD VISIT:   OBJECTIVE:   DIAGNOSTIC FINDINGS:    PATIENT SURVEYS :    COGNITION: Overall cognitive status: Within functional limits for tasks assessed     SENSATION: WFL  POSTURE: tendency for fwd head, and posterior pelvic tilt in sitting.    UPPER EXTREMITY ROM:  Shoulders: WFL Neck: rotation/ mild limitation/ pain ,   flex/ mild soreness,   ext/mild soreness,    UPPER EXTREMITY MMT:  MMT Right eval Left eval  Shoulder flexion 4 4  Shoulder extension    Shoulder abduction 4 4  Shoulder adduction    Shoulder internal rotation    Shoulder external rotation    Middle trapezius    Lower trapezius    Elbow flexion    Elbow extension    Wrist flexion    Wrist extension    Wrist ulnar deviation    Wrist radial deviation    Wrist pronation    Wrist supination    Grip strength (lbs)    (Blank rows = not tested)  SHOULDER SPECIAL TESTS:   JOINT MOBILITY TESTING:  wfl   PALPATION: tenderness in R UT, R scalenes, into 1st rib,   LAD: relieving for shoulders.      TODAY'S TREATMENT:                                                                                                                                         DATE:   05/13/2023 Therapeutic Exercise: Aerobic: Supine: Seated: cervical rom- L/R rotation x 10, flexion x 5, UT stretch 20 sec x 3 bil;  Standing: shoulder rolls x 10 review for HEP;    Stretches:  sub occipital stretch 10 sec x 3;  doorway stretch 20 sec x 3 ;  supine pec stretch 30 sec x 3 with nerve glides;  Neuromuscular Re-education:  Manual Therapy: STM to bil cervical paraspinals, bil SO, SOR, R UT, R scalenes,   Cervical distraction ;  Therapeutic Activity: Self Care:    Eval: see below for HEP Manual: LAD for shoulders bil   PATIENT EDUCATION:  Education details: updated and reviewed HEP.  Person educated: Patient Education method: Explanation, Demonstration, Tactile cues, Verbal cues, and Handouts Education comprehension: verbalized understanding, returned demonstration, verbal cues required, tactile cues required, and needs further education   HOME EXERCISE PROGRAM: Access Code: [email or web address] URL: [email or web address] Date: 05/03/2023 Prepared by: Sedalia Muta  Exercises - Seated Cervical Sidebending Stretch  - 2 x daily - 3 reps - 15 hold - Seated Levator  Scapulae Stretch  - 2 x daily - 3 reps - 20 hold - Seated Cervical Rotation AROM  - 2 x daily - 1 sets - 5 reps - Seated Shoulder Rolls  - 2 x daily - 1 sets - 5-10 reps  ASSESSMENT:  CLINICAL IMPRESSION: Pt with tightness/soreness in R UT/scalenes and SO with palpation and manual today. Improving postural awareness, and decreasing symptoms in UEs. Plan to continue manual as needed and progress to light strengthening as able.   Eval:  Patient presents with primary complaint of pain in neck and heaviness into arms. She has increased pain in R side of neck and musculature. She has decreased posture with fwd head. She has ongoing pain with lack of effective HEP. Pt with decreased ability for full functional activities, reaching, lifting, carrying, and IADLs. Pt to benefit from skilled PT to improve deficits and pain.    OBJECTIVE IMPAIRMENTS: decreased activity tolerance, decreased mobility, decreased ROM, decreased strength, increased muscle spasms, impaired flexibility, improper body mechanics, and pain.   ACTIVITY LIMITATIONS: carrying, lifting, dressing, reach over head, hygiene/grooming, and locomotion level  PARTICIPATION LIMITATIONS: meal prep, cleaning, laundry, driving, shopping, community activity, occupation, and yard work  PERSONAL FACTORS: Past/current experiences and Time since onset of injury/illness/exacerbation are also affecting patient's functional outcome.   REHAB POTENTIAL: Good  CLINICAL DECISION MAKING: Stable/uncomplicated  EVALUATION COMPLEXITY: Low  GOALS: Goals reviewed with patient? Yes   SHORT TERM GOALS: Target date: 05/10/2023  Pt to be intendment with initial HEP  Goal status: INITIAL   LONG TERM GOALS: Target date: 06/21/2023   Pt to be independent with final HEP  Goal status: INITIAL  2.  Pt to demo improved cervical ROM to be [facility] and pain free, to improve ability for ADLs.   Goal status: INITIAL  3.  Pt to demo improved shoulder and  postural strength to be at least 4+/5, to improve stability and pain.  Goal status: INITIAL  4.  Pt to report decreased pain 0-2/10 during work day.   Goal status: INITIAL  5. Pt to report at least 75 % improvement in overall symptoms in arms and neck.    Goal status: INITIAL    PLAN: PT FREQUENCY: 1-2x/week  PT DURATION: 8 weeks  PLANNED INTERVENTIONS: Therapeutic exercises, Therapeutic activity, Neuromuscular re-education, Patient/Family education, Self Care, Joint mobilization, Joint manipulation, Stair training, DME instructions, Aquatic Therapy, Dry Needling, Electrical stimulation, Cryotherapy, Moist heat, Taping, Ultrasound, Ionotophoresis 4mg /ml Dexamethasone, Manual therapy,  Vasopneumatic device, Traction, Spinal manipulation, Spinal mobilization,   PLAN FOR NEXT SESSION:    Sedalia Muta, PT, DPT 8:01 AM  05/13/23

## 2023-05-17 ENCOUNTER — Encounter: Admitting: Physical Therapy

## 2023-05-19 ENCOUNTER — Encounter: Payer: Self-pay | Admitting: Rheumatology

## 2023-05-19 ENCOUNTER — Ambulatory Visit: Attending: Rheumatology | Admitting: Rheumatology

## 2023-05-19 VITALS — BP 117/77 | HR 69 | Resp 15 | Ht 64.0 in | Wt 224.0 lb

## 2023-05-19 DIAGNOSIS — M25552 Pain in left hip: Secondary | ICD-10-CM

## 2023-05-19 DIAGNOSIS — K219 Gastro-esophageal reflux disease without esophagitis: Secondary | ICD-10-CM

## 2023-05-19 DIAGNOSIS — E559 Vitamin D deficiency, unspecified: Secondary | ICD-10-CM

## 2023-05-19 DIAGNOSIS — R5383 Other fatigue: Secondary | ICD-10-CM | POA: Diagnosis not present

## 2023-05-19 DIAGNOSIS — R768 Other specified abnormal immunological findings in serum: Secondary | ICD-10-CM | POA: Diagnosis not present

## 2023-05-19 DIAGNOSIS — M25511 Pain in right shoulder: Secondary | ICD-10-CM | POA: Diagnosis not present

## 2023-05-19 DIAGNOSIS — E781 Pure hyperglyceridemia: Secondary | ICD-10-CM

## 2023-05-19 DIAGNOSIS — K760 Fatty (change of) liver, not elsewhere classified: Secondary | ICD-10-CM

## 2023-05-19 DIAGNOSIS — M542 Cervicalgia: Secondary | ICD-10-CM

## 2023-05-19 DIAGNOSIS — M25512 Pain in left shoulder: Secondary | ICD-10-CM

## 2023-05-19 DIAGNOSIS — G8929 Other chronic pain: Secondary | ICD-10-CM

## 2023-05-19 DIAGNOSIS — M25551 Pain in right hip: Secondary | ICD-10-CM

## 2023-05-19 DIAGNOSIS — F5101 Primary insomnia: Secondary | ICD-10-CM

## 2023-05-19 DIAGNOSIS — F330 Major depressive disorder, recurrent, mild: Secondary | ICD-10-CM

## 2023-05-19 DIAGNOSIS — M47816 Spondylosis without myelopathy or radiculopathy, lumbar region: Secondary | ICD-10-CM

## 2023-05-19 DIAGNOSIS — J301 Allergic rhinitis due to pollen: Secondary | ICD-10-CM

## 2023-05-19 DIAGNOSIS — M2550 Pain in unspecified joint: Secondary | ICD-10-CM

## 2023-05-19 DIAGNOSIS — M546 Pain in thoracic spine: Secondary | ICD-10-CM

## 2023-05-19 DIAGNOSIS — K582 Mixed irritable bowel syndrome: Secondary | ICD-10-CM

## 2023-05-19 DIAGNOSIS — F411 Generalized anxiety disorder: Secondary | ICD-10-CM

## 2023-05-19 DIAGNOSIS — M797 Fibromyalgia: Secondary | ICD-10-CM

## 2023-05-19 DIAGNOSIS — I10 Essential (primary) hypertension: Secondary | ICD-10-CM

## 2023-05-19 DIAGNOSIS — J4540 Moderate persistent asthma, uncomplicated: Secondary | ICD-10-CM

## 2023-05-19 NOTE — Patient Instructions (Signed)
 Hip Exercises Ask your health care provider which exercises are safe for you. Do exercises exactly as told by your provider and adjust them as told. It is normal to feel mild stretching, pulling, tightness, or discomfort as you do these exercises. Stop right away if you feel sudden pain or your pain gets worse. Do not begin these exercises until told by your provider. Stretching and range-of-motion exercises These exercises warm up your muscles and joints and improve the movement and flexibility of your hip. They also help to relieve pain, numbness, and tingling. You may be asked to limit your range of motion if you had a hip replacement. Talk to your provider about these limits. Hamstrings, supine  Lie on your back (supine position). Loop a belt, towel, or exercise band over the ball of your left / right foot. The ball of your foot is on the walking surface, right under your toes. Straighten your left / right knee and slowly pull on the belt, towel, or band to raise your leg until you feel a gentle stretch behind your knee (hamstring). Do not let your knee bend while you do this. Keep your other leg flat on the floor. Hold this position for __________ seconds. Slowly return your leg to the starting position. Repeat __________ times. Complete this exercise __________ times a day. Hip rotation  Lie on your back on a firm surface. With your left / right hand, gently pull your left / right knee toward the shoulder that is on the same side of the body. Stop when your knee is pointing toward the ceiling. Hold your left / right ankle with your other hand. Keeping your knee steady, gently pull your left / right ankle toward your other shoulder until you feel a stretch in your butt. Keep your hips and shoulders firmly planted while you do this stretch. Hold this position for __________ seconds. Repeat __________ times. Complete this exercise __________ times a day. Seated stretch This exercise is  sometimes called hamstrings and adductors stretch. Sit on the floor with your legs stretched wide. Keep your knees straight during this exercise. Keeping your head and back in a straight line, bend at your waist to reach for your left foot (position A). You should feel a stretch in your right inner thigh (adductors). Hold this position for __________ seconds. Then slowly return to the upright position. Keeping your head and back in a straight line, bend at your waist to reach forward (position B). You should feel a stretch behind both of your thighs and knees (hamstrings). Hold this position for __________ seconds. Then slowly return to the upright position. Keeping your head and back in a straight line, bend at your waist to reach for your right foot (position C). You should feel a stretch in your left inner thigh (adductors). Hold this position for __________ seconds. Then slowly return to the upright position. Repeat __________ times. Complete this exercise __________ times a day. Lunge This exercise stretches the muscles of the hip (hip flexors). Place your left / right knee on the floor and bend your other knee so that is directly over your ankle. You should be half-kneeling. Keep good posture with your head over your shoulders. Tighten your butt muscles to point your tailbone downward. This will prevent your back from arching too much. You should feel a gentle stretch in the front of your left / right thigh and hip. If you do not feel a stretch, slide your other foot forward slightly and  then slowly lunge forward with your chest up until your knee once again lines up over your ankle. Make sure your tailbone continues to point downward. Hold this position for __________ seconds. Slowly return to the starting position. Repeat __________ times. Complete this exercise __________ times a day. Strengthening exercises These exercises build strength and endurance in your hip. Endurance is the  ability to use your muscles for a long time, even after they get tired. Bridge This exercise strengthens the muscles of your hip (hip extensors). Lie on your back on a firm surface with your knees bent and your feet flat on the floor. Tighten your butt muscles and lift your bottom off the floor until the trunk of your body and your hips are level with your thighs. Do not arch your back. You should feel the muscles working in your butt and the back of your thighs. If you do not feel these muscles, slide your feet 1-2 inches (2.5-5 cm) farther away from your butt. Hold this position for __________ seconds. Slowly lower your hips to the starting position. Let your muscles relax completely between repetitions. Repeat __________ times. Complete this exercise __________ times a day. Straight leg raises, side-lying This exercise strengthens the muscles that move the hip joint away from the center of the body (hip abductors). Lie on your side with your left / right leg in the top position. Lie so your head, shoulder, hip, and knee line up. You may bend your bottom knee slightly to help you balance. Roll your hips slightly forward, so your hips are stacked directly over each other and your left / right knee is facing forward. Leading with your heel, lift your top leg 4-6 inches (10-15 cm). You should feel the muscles in your top hip lifting. Do not let your foot drift forward. Do not let your knee roll toward the ceiling. Hold this position for __________ seconds. Slowly return to the starting position. Let your muscles relax completely between repetitions. Repeat __________ times. Complete this exercise __________ times a day. Straight leg raises, side-lying This exercise strengthens the muscles that move the hip joint toward the center of the body (hip adductors). Lie on your side with your left / right leg in the bottom position. Lie so your head, shoulder, hip, and knee line up. You may place your  upper foot in front to help you balance. Roll your hips slightly forward, so your hips are stacked directly over each other and your left / right knee is facing forward. Tense the muscles in your inner thigh and lift your bottom leg 4-6 inches (10-15 cm). Hold this position for __________ seconds. Slowly return to the starting position. Let your muscles relax completely between repetitions. Repeat __________ times. Complete this exercise __________ times a day. Straight leg raises, supine This exercise strengthens the muscles in the front of your thigh (quadriceps and hip flexors). Lie on your back (supine position) with your left / right leg extended and your other knee bent. Tense the muscles in the front of your left / right thigh. You should see your kneecap slide up or see increased dimpling just above your knee. Keep these muscles tight as you raise your leg 4-6 inches (10-15 cm) off the floor. Do not let your knee bend. Hold this position for __________ seconds. Keep these muscles tense as you lower your leg. Relax the muscles slowly and completely between repetitions. Repeat __________ times. Complete this exercise __________ times a day. Hip abductors, standing This  exercise strengthens the muscles that move the leg and hip joint away from the center of the body (hip abductors). Tie one end of a rubber exercise band or tubing to a secure surface, such as a chair, table, or pole. Loop the other end of the band or tubing around your left / right ankle. Keeping your ankle with the band or tubing directly opposite the secured end, step away until there is tension in the tubing or band. Hold on to a chair, table, or pole as needed for balance. Lift your left / right leg out to your side. While you do this: Keep your back upright. Keep your shoulders over your hips. Keep your toes pointing forward. Make sure to use your hip muscles to slowly lift your leg. Do not tip your body or  forcefully lift your leg. Hold this position for __________ seconds. Slowly return to the starting position. Repeat __________ times. Complete this exercise __________ times a day. Squats This exercise strengthens the muscles in the front of your thigh (quadriceps). Stand in front of a table, or stand in a doorframe so your feet and knees are in line with the frame. You may place your hands on the table or frame for balance. Slowly bend your knees and lower your hips like you are going to sit in a chair. Keep your lower legs in a straight up-and-down position. Do not let your hips go lower than your knees. Do not bend your knees lower than told by your provider. If your hip pain increases, do not bend as low. Hold this position for ___________ seconds. Slowly push with your legs to return to standing. Do not use your hands to pull yourself to standing. Repeat __________ times. Complete this exercise __________ times a day. This information is not intended to replace advice given to you by your health care provider. Make sure you discuss any questions you have with your health care provider. Document Revised: 09/16/2021 Document Reviewed: 09/16/2021 [employer] Patient Education  2024 [employer] Low Back Sprain or Strain Rehab Ask your health care provider which exercises are safe for you. Do exercises exactly as told by your health care provider and adjust them as directed. It is normal to feel mild stretching, pulling, tightness, or discomfort as you do these exercises. Stop right away if you feel sudden pain or your pain gets worse. Do not begin these exercises until told by your health care provider. Stretching and range-of-motion exercises These exercises warm up your muscles and joints and improve the movement and flexibility of your back. These exercises also help to relieve pain, numbness, and tingling. Lumbar rotation  Lie on your back on a firm bed or the floor with your knees  bent. Straighten your arms out to your sides so each arm forms a 90-degree angle (right angle) with a side of your body. Slowly move (rotate) both of your knees to one side of your body until you feel a stretch in your lower back (lumbar). Try not to let your shoulders lift off the floor. Hold this position for __________ seconds. Tense your abdominal muscles and slowly move your knees back to the starting position. Repeat this exercise on the other side of your body. Repeat __________ times. Complete this exercise __________ times a day. Single knee to chest  Lie on your back on a firm bed or the floor with both legs straight. Bend one of your knees. Use your hands to move your knee up toward your  chest until you feel a gentle stretch in your lower back and buttock. Hold your leg in this position by holding on to the front of your knee. Keep your other leg as straight as possible. Hold this position for __________ seconds. Slowly return to the starting position. Repeat with your other leg. Repeat __________ times. Complete this exercise __________ times a day. Prone extension on elbows  Lie on your abdomen on a firm bed or the floor (prone position). Prop yourself up on your elbows. Use your arms to help lift your chest up until you feel a gentle stretch in your abdomen and your lower back. This will place some of your body weight on your elbows. If this is uncomfortable, try stacking pillows under your chest. Your hips should stay down, against the surface that you are lying on. Keep your hip and back muscles relaxed. Hold this position for __________ seconds. Slowly relax your upper body and return to the starting position. Repeat __________ times. Complete this exercise __________ times a day. Strengthening exercises These exercises build strength and endurance in your back. Endurance is the ability to use your muscles for a long time, even after they get tired. Pelvic tilt This  exercise strengthens the muscles that lie deep in the abdomen. Lie on your back on a firm bed or the floor with your legs extended. Bend your knees so they are pointing toward the ceiling and your feet are flat on the floor. Tighten your lower abdominal muscles to press your lower back against the floor. This motion will tilt your pelvis so your tailbone points up toward the ceiling instead of pointing to your feet or the floor. To help with this exercise, you may place a small towel under your lower back and try to push your back into the towel. Hold this position for __________ seconds. Let your muscles relax completely before you repeat this exercise. Repeat __________ times. Complete this exercise __________ times a day. Alternating arm and leg raises  Get on your hands and knees on a firm surface. If you are on a hard floor, you may want to use padding, such as an exercise mat, to cushion your knees. Line up your arms and legs. Your hands should be directly below your shoulders, and your knees should be directly below your hips. Lift your left leg behind you. At the same time, raise your right arm and straighten it in front of you. Do not lift your leg higher than your hip. Do not lift your arm higher than your shoulder. Keep your abdominal and back muscles tight. Keep your hips facing the ground. Do not arch your back. Keep your balance carefully, and do not hold your breath. Hold this position for __________ seconds. Slowly return to the starting position. Repeat with your right leg and your left arm. Repeat __________ times. Complete this exercise __________ times a day. Abdominal set with straight leg raise  Lie on your back on a firm bed or the floor. Bend one of your knees and keep your other leg straight. Tense your abdominal muscles and lift your straight leg up, 4-6 inches (10-15 cm) off the ground. Keep your abdominal muscles tight and hold this position for __________  seconds. Do not hold your breath. Do not arch your back. Keep it flat against the ground. Keep your abdominal muscles tense as you slowly lower your leg back to the starting position. Repeat with your other leg. Repeat __________ times. Complete this exercise __________  times a day. Single leg lower with bent knees Lie on your back on a firm bed or the floor. Tense your abdominal muscles and lift your feet off the floor, one foot at a time, so your knees and hips are bent in 90-degree angles (right angles). Your knees should be over your hips and your lower legs should be parallel to the floor. Keeping your abdominal muscles tense and your knee bent, slowly lower one of your legs so your toe touches the ground. Lift your leg back up to return to the starting position. Do not hold your breath. Do not let your back arch. Keep your back flat against the ground. Repeat with your other leg. Repeat __________ times. Complete this exercise __________ times a day. Posture and body mechanics Good posture and healthy body mechanics can help to relieve stress in your body's tissues and joints. Body mechanics refers to the movements and positions of your body while you do your daily activities. Posture is part of body mechanics. Good posture means: Your spine is in its natural S-curve position (neutral). Your shoulders are pulled back slightly. Your head is not tipped forward (neutral). Follow these guidelines to improve your posture and body mechanics in your everyday activities. Standing  When standing, keep your spine neutral and your feet about hip-width apart. Keep a slight bend in your knees. Your ears, shoulders, and hips should line up. When you do a task in which you stand in one place for a long time, place one foot up on a stable object that is 2-4 inches (5-10 cm) high, such as a footstool. This helps keep your spine neutral. Sitting  When sitting, keep your spine neutral and keep your  feet flat on the floor. Use a footrest, if necessary, and keep your thighs parallel to the floor. Avoid rounding your shoulders, and avoid tilting your head forward. When working at a desk or a computer, keep your desk at a height where your hands are slightly lower than your elbows. Slide your chair under your desk so you are close enough to maintain good posture. When working at a computer, place your monitor at a height where you are looking straight ahead and you do not have to tilt your head forward or downward to look at the screen. Resting When lying down and resting, avoid positions that are most painful for you. If you have pain with activities such as sitting, bending, stooping, or squatting, lie in a position in which your body does not bend very much. For example, avoid curling up on your side with your arms and knees near your chest (fetal position). If you have pain with activities such as standing for a long time or reaching with your arms, lie with your spine in a neutral position and bend your knees slightly. Try the following positions: Lying on your side with a pillow between your knees. Lying on your back with a pillow under your knees. Lifting  When lifting objects, keep your feet at least shoulder-width apart and tighten your abdominal muscles. Bend your knees and hips and keep your spine neutral. It is important to lift using the strength of your legs, not your back. Do not lock your knees straight out. Always ask for help to lift heavy or awkward objects. This information is not intended to replace advice given to you by your health care provider. Make sure you discuss any questions you have with your health care provider. Document Revised: 05/18/2022 Document Reviewed:  04/01/2020 [employer] Patient Education  2024 [employer].

## 2023-05-20 ENCOUNTER — Encounter: Payer: Self-pay | Admitting: Physical Therapy

## 2023-05-20 ENCOUNTER — Other Ambulatory Visit (HOSPITAL_COMMUNITY): Payer: Self-pay

## 2023-05-20 ENCOUNTER — Ambulatory Visit: Admitting: Physical Therapy

## 2023-05-20 ENCOUNTER — Encounter: Payer: Self-pay | Admitting: Family Medicine

## 2023-05-20 ENCOUNTER — Ambulatory Visit (INDEPENDENT_AMBULATORY_CARE_PROVIDER_SITE_OTHER): Admitting: Family Medicine

## 2023-05-20 VITALS — BP 128/72 | HR 59 | Temp 98.1°F | Ht 64.0 in | Wt 221.8 lb

## 2023-05-20 DIAGNOSIS — M797 Fibromyalgia: Secondary | ICD-10-CM

## 2023-05-20 DIAGNOSIS — M5412 Radiculopathy, cervical region: Secondary | ICD-10-CM

## 2023-05-20 DIAGNOSIS — F321 Major depressive disorder, single episode, moderate: Secondary | ICD-10-CM | POA: Diagnosis not present

## 2023-05-20 DIAGNOSIS — F411 Generalized anxiety disorder: Secondary | ICD-10-CM

## 2023-05-20 DIAGNOSIS — M542 Cervicalgia: Secondary | ICD-10-CM | POA: Diagnosis not present

## 2023-05-20 MED ORDER — NALTREXONE-BUPROPION HCL ER 8-90 MG PO TB12: ORAL_TABLET | ORAL | 5 refills | Status: DC

## 2023-05-20 MED ORDER — NALTREXONE-BUPROPION HCL ER 8-90 MG PO TB12
ORAL_TABLET | ORAL | 5 refills | Status: DC
  Filled 2023-05-20: qty 180, 90d supply, fill #0
  Filled 2023-05-21: qty 180, 45d supply, fill #0
  Filled 2023-05-26: qty 78, 30d supply, fill #0
  Filled 2023-07-08: qty 78, 30d supply, fill #1
  Filled 2023-08-16: qty 78, 30d supply, fill #2
  Filled 2023-09-17: qty 78, 30d supply, fill #3
  Filled 2023-10-18: qty 120, 30d supply, fill #4
  Filled 2023-10-20 – 2023-10-22 (×2): qty 78, 30d supply, fill #4

## 2023-05-20 NOTE — Progress Notes (Signed)
 Subjective  CC:  Chief Complaint  Patient presents with   Follow-up   Neck Pain    Pt stated that her neck pain is getting better and she has therapy @ 12:15    HPI: Monica Benjamin is a 43 y.o. female who presents to the office today to address the problems listed above in the chief complaint. Discussed the use of AI scribe software for clinical note transcription with the patient, who gave verbal consent to proceed.  History of Present Illness   Monica Benjamin is a 43 year old female with fibromyalgia who presents for follow-up regarding fibromyalgia and associated symptoms.  See last note, she had follow-up with rheumatology.  Evaluation for inflammatory conditions was negative.  She does have extensive pain and some osteoarthritis changes.  Physical therapy is helping.  A new diagnosis of fibromyalgia has been made.  She is managing this diagnosis well.  She experiences generalized aches and pains consistent with fibromyalgia. Physical activity has been beneficial in managing her symptoms. She attends physical therapy sessions twice a week, which have been particularly helpful for her neck pain. She has also been given lower back strengthening exercises.  We have added Wellbutrin  to Trintellix  to manage her anxiety.  This has been helpful.  She has been taking Wellbutrin  for a little over a month, which has helped manage her anxiety and reduce panic-like symptoms. She also attends therapy sessions, which she finds beneficial.  Her sleep has improved, with only one awakening last night, and she attributes this to an overall improvement in her condition. She has not experienced significant fatigue recently.  She wants to lose weight but finds it challenging due to perimenopause, chronic pain, and anxiety. Her weight has remained stable within a range of three to five pounds over the past five to six years.  Hypertension follow-up: Taking medications regularly.  No chest pain  or shortness of breath.      Assessment  1. Fibromyalgia   2. GAD (generalized anxiety disorder)      Plan  Assessment and Plan    Fibromyalgia Chronic pain and fatigue managed with physical therapy and lifestyle changes. Considering low-dose naltrexone  for fibromyalgia and weight management, pending insurance approval. - Continue physical therapy, consider water aerobics, swimming, and yoga. - Consider turmeric supplementation for inflammation. - Consider low-dose naltrexone  as part of Contrave , pending insurance approval.  Weight Management Stable weight despite difficulty losing weight. Interested in Contrave  for weight management, pending insurance approval. Discussed potential side effects and titration. - Start Contrave  with titration: one pill once a day for a week, then increase to one pill twice a day. - Monitor for side effects such as vivid dreams and headaches. - Continue lifestyle modifications for weight management.  Hypertension Blood pressure variability noted, with higher readings during clinic visits. Currently on antihypertensive medication. - Continue current antihypertensive medication regimen. - Monitor blood pressure regularly at home.  Anxiety Improved anxiety symptoms with Wellbutrin  and therapy sessions. - Continue Wellbutrin  at the current dosage. - Continue therapy sessions.        No orders of the defined types were placed in this encounter.  Meds ordered this encounter  Medications   DISCONTD: Naltrexone -buPROPion  HCl ER 8-90 MG TB12    Sig: Start 1 tablet every morning for 7 days, then 1 tablet twice daily for 7 days, then 2 tablets every morning and one in the evening for 7 days, then 2 tab twice a day    Dispense:  180 tablet    Refill:  5    Start prior authorization; has failed phentermine, diet program and exercise.   Naltrexone -buPROPion  HCl ER 8-90 MG TB12    Sig: Start 1 tablet every morning for 7 days, then 1 tablet twice daily  for 7 days, then 2 tablets every morning and one in the evening for 7 days, then 2 tab twice a day    Dispense:  180 tablet    Refill:  5    Start prior authorization; has failed phentermine, diet program and exercise.     I reviewed the patients updated PMH, FH, and SocHx.    Patient Active Problem List   Diagnosis Date Noted   Essential hypertension 03/16/2018    Priority: High   History of obstructive sleep apnea 05/25/2016    Priority: High   Moderate persistent asthma without complication 11/25/2015    Priority: High   Hamartoma (HCC) 10/12/2022    Priority: Medium    NAFLD (nonalcoholic fatty liver disease) 16/10/9602    Priority: Medium    Mild episode of recurrent major depressive disorder (HCC) 05/10/2018    Priority: Medium    Hypertriglyceridemia 05/10/2018    Priority: Medium    Chronic seasonal allergic rhinitis due to pollen 11/25/2015    Priority: Medium    GAD (generalized anxiety disorder) 11/25/2015    Priority: Medium    Gastroesophageal reflux disease without esophagitis 11/25/2015    Priority: Medium    Irritable bowel syndrome with both constipation and diarrhea 11/25/2015    Priority: Medium    Vitamin B12 deficiency 01/30/2023    Priority: Low   Perimenopausal symptoms 10/12/2022    Priority: Low   Vitamin D  deficiency 02/26/2021    Priority: Low   Infertility, female 02/15/2017    Priority: Low   Fibromyalgia 05/20/2023   Current Meds  Medication Sig   albuterol  (VENTOLIN  HFA) 108 (90 Base) MCG/ACT inhaler Inhale 2 puffs into the lungs every 4 (four) hours as needed (cough, shortness of breath or wheezing.).   Albuterol -Budesonide  (AIRSUPRA ) 90-80 MCG/ACT AERO Inhale 2 puffs into the lungs every 4 (four) hours as needed (Asthma symptoms).   cetirizine (ZYRTEC) 10 MG tablet Take 10 mg by mouth daily.   chlorpheniramine  (CHLOR-TRIMETON ) 4 MG tablet Take 1 tablet (4 mg total) by mouth every 6 (six) hours as needed for allergies (Cough).    clonazePAM  (KLONOPIN ) 0.5 MG tablet Take 1/2-1 tablet (0.25-0.5 mg total) by mouth daily as needed for anxiety   hyoscyamine  (LEVSIN/SL) 0.125 MG SL tablet Place 1 tablet (0.125 mg total) under the tongue 3 (three) times daily before meals as needed for bloating and cramping   ipratropium (ATROVENT ) 0.03 % nasal spray Place 2 sprays into both nostrils every 12 (twelve) hours.   losartan -hydrochlorothiazide  (HYZAAR ) 50-12.5 MG tablet Take 1 tablet by mouth daily.   Omega-3 Fatty Acids (FISH OIL) 1000 MG CAPS Take 1 capsule by mouth daily.   pantoprazole  (PROTONIX ) 20 MG tablet Take 1 tablet (20 mg total) by mouth daily.   traMADol  (ULTRAM ) 50 MG tablet Take 1 tablet (50 mg total) by mouth every 6 (six) hours as needed for moderate pain (pain score 4-6).   vortioxetine  HBr (TRINTELLIX ) 10 MG TABS tablet Take 1 tablet (10 mg total) by mouth daily.   [DISCONTINUED] buPROPion  (WELLBUTRIN  XL) 150 MG 24 hr tablet Take 1 tablet (150 mg total) by mouth daily.   [DISCONTINUED] Naltrexone -buPROPion  HCl ER 8-90 MG TB12 Start 1 tablet every morning for  7 days, then 1 tablet twice daily for 7 days, then 2 tablets every morning and one in the evening for 7 days, then 2 tab twice a day    Allergies: Patient is allergic to ace inhibitors, nsaids, and prednisone . Family History: Patient family history includes Alcoholism in her father and another family member; Anxiety disorder in her mother and another family member; COPD in her paternal grandmother; Dementia in her maternal grandmother; Diabetes in her maternal grandfather; Hyperlipidemia in her father; Hypertension in her father; Migraines in her mother and sister; Osteoporosis in her mother. Social History:  Patient  reports that she has never smoked. She has never been exposed to tobacco smoke. She has never used smokeless tobacco. She reports current alcohol use. She reports that she does not use drugs.  Review of Systems: Constitutional: Negative for fever  malaise or anorexia Cardiovascular: negative for chest pain Respiratory: negative for SOB or persistent cough Gastrointestinal: negative for abdominal pain  Objective  Vitals: BP 128/72   Pulse (!) 59   Temp 98.1 F (36.7 C)   Ht 5\' 4"  (1.626 m)   Wt 221 lb 12.8 oz (100.6 kg)   SpO2 98%   BMI 38.07 kg/m  General: no acute distress , A&Ox3 HEENT: PEERL, conjunctiva normal, neck is supple Cardiovascular:  RRR without murmur or gallop.  Respiratory:  Good breath sounds bilaterally, CTAB with normal respiratory effort Skin:  Warm, no rashes  Commons side effects, risks, benefits, and alternatives for medications and treatment plan prescribed today were discussed, and the patient expressed understanding of the given instructions. Patient is instructed to call or message via MyChart if he/she has any questions or concerns regarding our treatment plan. No barriers to understanding were identified. We discussed Red Flag symptoms and signs in detail. Patient expressed understanding regarding what to do in case of urgent or emergency type symptoms.  Medication list was reconciled, printed and provided to the patient in AVS. Patient instructions and summary information was reviewed with the patient as documented in the AVS. This note was prepared with assistance of Dragon voice recognition software. Occasional wrong-word or sound-a-like substitutions may have occurred due to the inherent limitations of voice recognition software

## 2023-05-20 NOTE — Therapy (Signed)
 OUTPATIENT PHYSICAL THERAPY UPPER EXTREMITY TREATMENT   Patient Name: VEENA ALDAY MRN: [id number] DOB:21-May-1980, 43 y.o., female Today's Date: 05/20/2023  END OF SESSION:  PT End of Session - 05/20/23 1216     Visit Number 3    Number of Visits 16    Date for PT Re-Evaluation 06/21/23    Authorization Type [facility]/save/[employer]    PT Start Time 1217    PT Stop Time 1300    PT Time Calculation (min) 43 min    Activity Tolerance Patient tolerated treatment well    Behavior During Therapy [facility] for tasks assessed/performed             Past Medical History:  Diagnosis Date   Allergy     Anxiety    Asthma    Back pain    Depression    GERD (gastroesophageal reflux disease)    Hyperlipidemia    Hypertension    IBS (irritable bowel syndrome)    Infertility, female 02/15/2017   IVF treatment   Lumbar herniated disc    L4-L5   Migraines    Other fatigue    Shortness of breath on exertion    Sleep apnea    Trigeminal neuralgia    Vitamin D  deficiency 01/26/2021   Past Surgical History:  Procedure Laterality Date   BREAST REDUCTION SURGERY Bilateral 12/31/2021   Procedure: BREAST REDUCTION WITH LIPOSUCTION;  Surgeon: Thornell Flirt, DO;  Location: [facility];  Service: Plastics;  Laterality: Bilateral;   WISDOM TOOTH EXTRACTION     Patient Active Problem List   Diagnosis Date Noted   Fibromyalgia 05/20/2023   Vitamin B12 deficiency 01/30/2023   Hamartoma (HCC) 10/12/2022   Perimenopausal symptoms 10/12/2022   Vitamin D  deficiency 02/26/2021   NAFLD (nonalcoholic fatty liver disease) 40/98/1191   Mild episode of recurrent major depressive disorder (HCC) 05/10/2018   Hypertriglyceridemia 05/10/2018   Essential hypertension 03/16/2018   Infertility, female 02/15/2017   History of obstructive sleep apnea 05/25/2016   Chronic seasonal allergic rhinitis due to pollen 11/25/2015   GAD (generalized anxiety disorder) 11/25/2015   Gastroesophageal  reflux disease without esophagitis 11/25/2015   Irritable bowel syndrome with both constipation and diarrhea 11/25/2015   Moderate persistent asthma without complication 11/25/2015    PCP: Karma Oz  REFERRING PROVIDER: Karma Oz  REFERRING DIAG: Neck pain/ S/P breast reduction  THERAPY DIAG:  Cervicalgia  Radiculopathy, cervical region  Rationale for Evaluation and Treatment: Rehabilitation  ONSET DATE: 40mo-1 yr ago  SUBJECTIVE:  SUBJECTIVE STATEMENT: Pt states improving pain and discomfort overall.   Eval: Pt states  Increased neck pain for about 6 mo-1 yr. Pain on  R side of neck , R UT. Both arms feel heavy, more on the L.   Neck pain feels constant. Hurts more in AM. Pt works at desk, tries to walk 3x/wk.  Sleeps at least 6 hrs/night,   Hand dominance: Right  PERTINENT HISTORY: s/p breast reduction, back pain,    PAIN:  Are you having pain? Yes: NPRS scale: 4-5/10 Pain location: R side of neck, into arms. Pain description: sore , heavy Aggravating factors: most movement Relieving factors: none stated    PRECAUTIONS: None  RED FLAGS: None   WEIGHT BEARING RESTRICTIONS: No   FALLS:  Has patient fallen in last 6 months? No   PLOF: Independent  PATIENT GOALS: Decreased pain in neck and arms.   NEXT MD VISIT:   OBJECTIVE:   DIAGNOSTIC FINDINGS:    PATIENT SURVEYS :    COGNITION: Overall cognitive status: Within functional limits for tasks assessed     SENSATION: WFL  POSTURE: tendency for fwd head, and posterior pelvic tilt in sitting.    UPPER EXTREMITY ROM:  Shoulders: WFL Neck: rotation/ mild limitation/ pain ,   flex/ mild soreness,  ext/mild soreness,    UPPER EXTREMITY MMT:  MMT Right eval Left eval  Shoulder flexion 4 4  Shoulder extension     Shoulder abduction 4 4  Shoulder adduction    Shoulder internal rotation    Shoulder external rotation    Middle trapezius    Lower trapezius    Elbow flexion    Elbow extension    Wrist flexion    Wrist extension    Wrist ulnar deviation    Wrist radial deviation    Wrist pronation    Wrist supination    Grip strength (lbs)    (Blank rows = not tested)  SHOULDER SPECIAL TESTS:   JOINT MOBILITY TESTING:  wfl   PALPATION: tenderness in R UT, R scalenes, into 1st rib,   LAD: relieving for shoulders.      TODAY'S TREATMENT:                                                                                                                                         DATE:   05/20/2023 Therapeutic Exercise: Aerobic: Supine: Seated:   Standing: wall angels 2 x 5;    Rows GTB x 20; Shoulder ER RTB x 20;  Wall push ups x 15;  Stretches:  sub occipital stretch 10 sec x 3;  doorway stretch 20 sec x 3 ;  single arm pec stretch in doorway x 3;  Lat stretch at rail x 4;   supine pec stretch 30 sec x 3 with nerve glides;  Neuromuscular Re-education: Manual Therapy: STM to bil cervical paraspinals, SOR, R UT, R  scalenes,   Cervical distraction ;  Therapeutic Activity: Self Care:   Therapeutic Exercise: Aerobic: Supine: Seated: cervical rom- L/R rotation x 10, flexion x 5, UT stretch 20 sec x 3 bil;  Standing: shoulder rolls x 10 review for HEP;    Stretches:  sub occipital stretch 10 sec x 3;  doorway stretch 20 sec x 3 ;  supine pec stretch 30 sec x 3 with nerve glides;  Neuromuscular Re-education: Manual Therapy: STM to bil cervical paraspinals, bil SO, SOR, R UT, R scalenes,   Cervical distraction ;  Therapeutic Activity: Self Care:    Eval: see below for HEP Manual: LAD for shoulders bil   PATIENT EDUCATION:  Education details: updated and reviewed HEP.  Person educated: Patient Education method: Explanation, Demonstration, Tactile cues, Verbal cues, and  Handouts Education comprehension: verbalized understanding, returned demonstration, verbal cues required, tactile cues required, and needs further education   HOME EXERCISE PROGRAM: Access Code: [email or web address] URL: [email or web address] Date: 05/03/2023 Prepared by: Terrilee Few  Exercises - Seated Cervical Sidebending Stretch  - 2 x daily - 3 reps - 15 hold - Seated Levator Scapulae Stretch  - 2 x daily - 3 reps - 20 hold - Seated Cervical Rotation AROM  - 2 x daily - 1 sets - 5 reps - Seated Shoulder Rolls  - 2 x daily - 1 sets - 5-10 reps  ASSESSMENT:  CLINICAL IMPRESSION: Pt with tightness/soreness in R UT into scalenes  with palpation and manual today. Improving postural awareness, and decreasing symptoms in UEs. Good tolerance for strengthening today. Challenged with UE flexion overhead and lat stretch today. Pt to benefit from continued work on this and continued care.   Eval:  Patient presents with primary complaint of pain in neck and heaviness into arms. She has increased pain in R side of neck and musculature. She has decreased posture with fwd head. She has ongoing pain with lack of effective HEP. Pt with decreased ability for full functional activities, reaching, lifting, carrying, and IADLs. Pt to benefit from skilled PT to improve deficits and pain.    OBJECTIVE IMPAIRMENTS: decreased activity tolerance, decreased mobility, decreased ROM, decreased strength, increased muscle spasms, impaired flexibility, improper body mechanics, and pain.   ACTIVITY LIMITATIONS: carrying, lifting, dressing, reach over head, hygiene/grooming, and locomotion level  PARTICIPATION LIMITATIONS: meal prep, cleaning, laundry, driving, shopping, community activity, occupation, and yard work  PERSONAL FACTORS: Past/current experiences and Time since onset of injury/illness/exacerbation are also affecting patient's functional outcome.   REHAB POTENTIAL: Good  CLINICAL DECISION  MAKING: Stable/uncomplicated  EVALUATION COMPLEXITY: Low  GOALS: Goals reviewed with patient? Yes   SHORT TERM GOALS: Target date: 05/10/2023  Pt to be intendment with initial HEP  Goal status: INITIAL   LONG TERM GOALS: Target date: 06/21/2023   Pt to be independent with final HEP  Goal status: INITIAL  2.  Pt to demo improved cervical ROM to be [facility] and pain free, to improve ability for ADLs.   Goal status: INITIAL  3.  Pt to demo improved shoulder and postural strength to be at least 4+/5, to improve stability and pain.  Goal status: INITIAL  4.  Pt to report decreased pain 0-2/10 during work day.   Goal status: INITIAL  5. Pt to report at least 75 % improvement in overall symptoms in arms and neck.    Goal status: INITIAL    PLAN: PT FREQUENCY: 1-2x/week  PT DURATION: 8 weeks  PLANNED INTERVENTIONS: Therapeutic exercises, Therapeutic activity, Neuromuscular  re-education, Patient/Family education, Self Care, Joint mobilization, Joint manipulation, Stair training, DME instructions, Aquatic Therapy, Dry Needling, Electrical stimulation, Cryotherapy, Moist heat, Taping, Ultrasound, Ionotophoresis 4mg /ml Dexamethasone , Manual therapy,  Vasopneumatic device, Traction, Spinal manipulation, Spinal mobilization,   PLAN FOR NEXT SESSION:    Terrilee Few, PT, DPT 12:16 PM  05/20/23

## 2023-05-20 NOTE — Patient Instructions (Addendum)
 Please return in 3-6 months for weight recheck, December for cpe  If you have any questions or concerns, please don't hesitate to send me a message via MyChart or call the office at [phone]. Thank you for visiting with us  today! It's our pleasure caring for you.   VISIT SUMMARY:  During today's visit, we discussed your ongoing management of fibromyalgia, weight management, blood pressure, and anxiety. You have been experiencing generalized aches and pains consistent with fibromyalgia, but physical therapy and lifestyle changes have been beneficial. Your anxiety has improved with Wellbutrin  and therapy sessions, and your sleep has also improved. We also noted that your blood pressure was high today, although it was normal the previous day without medication.  YOUR PLAN:  -FIBROMYALGIA: Fibromyalgia is a condition characterized by widespread pain and fatigue. We will continue with your physical therapy and consider adding water aerobics, swimming, and yoga to your routine. We are also considering low-dose naltrexone  for managing your fibromyalgia and weight, pending insurance approval. Additionally, you may consider taking turmeric supplements to help with inflammation.  -WEIGHT MANAGEMENT: Despite the challenges of perimenopause, chronic pain, and anxiety, your weight has remained stable. We discussed starting Contrave  for weight management, pending insurance approval. You will start with one pill once a day for a week, then increase to one pill twice a day. If needed, we can increase to up to 2 pills twice a day.  Please take this instead of the wellbutrin . Please monitor for side effects such as vivid dreams and headaches. Continue with your current lifestyle modifications to support weight management.  -HYPERTENSION: control is fair. You should continue your current blood pressure medication and regularly monitor your blood pressure at home.  -ANXIETY: depression is doing well on trintellix .  Your anxiety symptoms have improved with the use of Wellbutrin  and therapy sessions. Continue taking Wellbutrin  but use the contrave  instead and continue therapy sessions.  INSTRUCTIONS:  Please follow up with us  regarding the insurance approval for low-dose naltrexone  and Contrave . Continue monitoring your blood pressure at home and report any significant changes. If you experience any side effects from the new medications, please contact us  immediately.

## 2023-05-21 ENCOUNTER — Telehealth: Payer: Self-pay

## 2023-05-21 ENCOUNTER — Other Ambulatory Visit (HOSPITAL_COMMUNITY): Payer: Self-pay

## 2023-05-21 ENCOUNTER — Ambulatory Visit
Admission: RE | Admit: 2023-05-21 | Discharge: 2023-05-21 | Disposition: A | Discharge status: Home / Self Care | Source: Ambulatory Visit | Attending: Obstetrics and Gynecology | Admitting: Obstetrics and Gynecology

## 2023-05-21 DIAGNOSIS — R928 Other abnormal and inconclusive findings on diagnostic imaging of breast: Secondary | ICD-10-CM

## 2023-05-21 DIAGNOSIS — N6001 Solitary cyst of right breast: Secondary | ICD-10-CM | POA: Diagnosis not present

## 2023-05-21 NOTE — Telephone Encounter (Signed)
 Pharmacy Patient Advocate Encounter   Received notification from CoverMyMeds that prior authorization for Contrave  8-90MG  er tablets is required/requested.   Insurance verification completed.   The patient is insured through [facility] .   Per test claim: PA required; PA submitted to above mentioned insurance via CoverMyMeds Key/confirmation #/EOC [id number] Status is pending

## 2023-05-24 ENCOUNTER — Ambulatory Visit (INDEPENDENT_AMBULATORY_CARE_PROVIDER_SITE_OTHER): Admitting: Physical Therapy

## 2023-05-24 ENCOUNTER — Encounter: Payer: Self-pay | Admitting: Physical Therapy

## 2023-05-24 DIAGNOSIS — M5412 Radiculopathy, cervical region: Secondary | ICD-10-CM | POA: Diagnosis not present

## 2023-05-24 DIAGNOSIS — M542 Cervicalgia: Secondary | ICD-10-CM | POA: Diagnosis not present

## 2023-05-24 NOTE — Therapy (Signed)
 OUTPATIENT PHYSICAL THERAPY UPPER EXTREMITY TREATMENT   Patient Name: Monica Benjamin MRN: [id number] DOB:08/29/1980, 43 y.o., female Today's Date: 05/24/2023  END OF SESSION:  PT End of Session - 05/24/23 1214     Visit Number 4    Number of Visits 16    Date for PT Re-Evaluation 06/21/23    Authorization Type [facility]/save/[employer]    PT Start Time 1216    PT Stop Time 1258    PT Time Calculation (min) 42 min    Activity Tolerance Patient tolerated treatment well    Behavior During Therapy [facility] for tasks assessed/performed             Past Medical History:  Diagnosis Date   Allergy     Anxiety    Asthma    Back pain    Depression    GERD (gastroesophageal reflux disease)    Hyperlipidemia    Hypertension    IBS (irritable bowel syndrome)    Infertility, female 02/15/2017   IVF treatment   Lumbar herniated disc    L4-L5   Migraines    Other fatigue    Shortness of breath on exertion    Sleep apnea    Trigeminal neuralgia    Vitamin D  deficiency 01/26/2021   Past Surgical History:  Procedure Laterality Date   BREAST REDUCTION SURGERY Bilateral 12/31/2021   Procedure: BREAST REDUCTION WITH LIPOSUCTION;  Surgeon: Thornell Flirt, DO;  Location: [facility];  Service: Plastics;  Laterality: Bilateral;   WISDOM TOOTH EXTRACTION     Patient Active Problem List   Diagnosis Date Noted   Fibromyalgia 05/20/2023   Vitamin B12 deficiency 01/30/2023   Hamartoma (HCC) 10/12/2022   Perimenopausal symptoms 10/12/2022   Vitamin D  deficiency 02/26/2021   NAFLD (nonalcoholic fatty liver disease) 40/98/1191   Mild episode of recurrent major depressive disorder (HCC) 05/10/2018   Hypertriglyceridemia 05/10/2018   Essential hypertension 03/16/2018   Infertility, female 02/15/2017   History of obstructive sleep apnea 05/25/2016   Chronic seasonal allergic rhinitis due to pollen 11/25/2015   GAD (generalized anxiety disorder) 11/25/2015   Gastroesophageal  reflux disease without esophagitis 11/25/2015   Irritable bowel syndrome with both constipation and diarrhea 11/25/2015   Moderate persistent asthma without complication 11/25/2015    PCP: Karma Oz  REFERRING PROVIDER: Karma Oz  REFERRING DIAG: Neck pain/ S/P breast reduction  THERAPY DIAG:  Cervicalgia  Radiculopathy, cervical region  Rationale for Evaluation and Treatment: Rehabilitation  ONSET DATE: 36mo-1 yr ago  SUBJECTIVE:  SUBJECTIVE STATEMENT:  Pt states improving pain and discomfort overall. Hands/arms feeling good, neck still a bit sore, but improving.   Eval: Pt states  Increased neck pain for about 6 mo-1 yr. Pain on  R side of neck , R UT. Both arms feel heavy, more on the L.   Neck pain feels constant. Hurts more in AM. Pt works at desk, tries to walk 3x/wk.  Sleeps at least 6 hrs/night,   Hand dominance: Right  PERTINENT HISTORY: s/p breast reduction, back pain,    PAIN:  Are you having pain? Yes: NPRS scale: 2-3/10 Pain location: R side of neck, into arms. Pain description: sore , heavy Aggravating factors: most movement Relieving factors: none stated    PRECAUTIONS: None  RED FLAGS: None   WEIGHT BEARING RESTRICTIONS: No   FALLS:  Has patient fallen in last 6 months? No   PLOF: Independent  PATIENT GOALS: Decreased pain in neck and arms.   NEXT MD VISIT:   OBJECTIVE:   DIAGNOSTIC FINDINGS:    PATIENT SURVEYS :    COGNITION: Overall cognitive status: Within functional limits for tasks assessed     SENSATION: WFL  POSTURE: tendency for fwd head, and posterior pelvic tilt in sitting.    UPPER EXTREMITY ROM:  Shoulders: WFL Neck: rotation/ mild limitation/ pain ,   flex/ mild soreness,  ext/mild soreness,    UPPER EXTREMITY MMT:  MMT  Right eval Left eval  Shoulder flexion 4 4  Shoulder extension    Shoulder abduction 4 4  Shoulder adduction    Shoulder internal rotation    Shoulder external rotation    Middle trapezius    Lower trapezius    Elbow flexion    Elbow extension    Wrist flexion    Wrist extension    Wrist ulnar deviation    Wrist radial deviation    Wrist pronation    Wrist supination    Grip strength (lbs)    (Blank rows = not tested)  SHOULDER SPECIAL TESTS:   JOINT MOBILITY TESTING:  wfl   PALPATION: tenderness in R UT, R scalenes, into 1st rib,   LAD: relieving for shoulders.      TODAY'S TREATMENT:                                                                                                                                         DATE:   05/24/2023 Therapeutic Exercise: Aerobic: Supine:   planks- elbows and knees 15 sec x 5;  SLR x 10 then x 5 bil  with TA and eduction on TA;  Bridging x 10;  Seated:   Standing:  wall angels 2 x 5;    shoulder flexion x 12;  Rows GTB x 20;  Shoulder ER RTB x 20;  Wall push ups 2 x 10;  Stretches:  doorway stretch 20 sec x 3 ;  single arm pec stretch in doorway x 3;  Lat stretch at rail x 4;   supine pec stretch 30 sec x 3 with nerve glides;  Neuromuscular Re-education: Manual Therapy: cervical distraction x 10; R clavicle mobs, manual stretch for UT, scalenes.  Cervical distraction ;  Therapeutic Activity: Self Care:   Therapeutic Exercise: Aerobic: Supine: Seated: cervical rom- L/R rotation x 10, flexion x 5, UT stretch 20 sec x 3 bil;  Standing: shoulder rolls x 10 review for HEP;    Stretches:  sub occipital stretch 10 sec x 3;  doorway stretch 20 sec x 3 ;  supine pec stretch 30 sec x 3 with nerve glides;  Neuromuscular Re-education: Manual Therapy: STM to bil cervical paraspinals, bil SO, SOR, R UT, R scalenes,   Cervical distraction ;  Therapeutic Activity: Self Care:    Eval: see below for HEP Manual: LAD for shoulders  bil   PATIENT EDUCATION:  Education details: updated and reviewed HEP.  Person educated: Patient Education method: Explanation, Demonstration, Tactile cues, Verbal cues, and Handouts Education comprehension: verbalized understanding, returned demonstration, verbal cues required, tactile cues required, and needs further education   HOME EXERCISE PROGRAM: Access Code: [email or web address] URL: [email or web address] Date: 05/03/2023 Prepared by: Terrilee Few  Exercises - Seated Cervical Sidebending Stretch  - 2 x daily - 3 reps - 15 hold - Seated Levator Scapulae Stretch  - 2 x daily - 3 reps - 20 hold - Seated Cervical Rotation AROM  - 2 x daily - 1 sets - 5 reps - Seated Shoulder Rolls  - 2 x daily - 1 sets - 5-10 reps  ASSESSMENT:  CLINICAL IMPRESSION: Pt with improving pain and tenderness in cervical musculature. She is improving with tolerance for UE and postural strengthening. She continues to be challenged with lat mobility and overhead/shoulder flexion motions, with increased shoulder and arm fatigue. Pt to benefit from progressive mobility and strength as tolerated.    Eval:  Patient presents with primary complaint of pain in neck and heaviness into arms. She has increased pain in R side of neck and musculature. She has decreased posture with fwd head. She has ongoing pain with lack of effective HEP. Pt with decreased ability for full functional activities, reaching, lifting, carrying, and IADLs. Pt to benefit from skilled PT to improve deficits and pain.    OBJECTIVE IMPAIRMENTS: decreased activity tolerance, decreased mobility, decreased ROM, decreased strength, increased muscle spasms, impaired flexibility, improper body mechanics, and pain.   ACTIVITY LIMITATIONS: carrying, lifting, dressing, reach over head, hygiene/grooming, and locomotion level  PARTICIPATION LIMITATIONS: meal prep, cleaning, laundry, driving, shopping, community activity, occupation, and yard  work  PERSONAL FACTORS: Past/current experiences and Time since onset of injury/illness/exacerbation are also affecting patient's functional outcome.   REHAB POTENTIAL: Good  CLINICAL DECISION MAKING: Stable/uncomplicated  EVALUATION COMPLEXITY: Low  GOALS: Goals reviewed with patient? Yes   SHORT TERM GOALS: Target date: 05/10/2023  Pt to be intendment with initial HEP  Goal status: INITIAL   LONG TERM GOALS: Target date: 06/21/2023   Pt to be independent with final HEP  Goal status: INITIAL  2.  Pt to demo improved cervical ROM to be [facility] and pain free, to improve ability for ADLs.   Goal status: INITIAL  3.  Pt to demo improved shoulder and postural strength to be at least 4+/5, to improve stability and pain.  Goal status: INITIAL  4.  Pt to report decreased pain 0-2/10 during work day.   Goal status: INITIAL  5. Pt to report at least 75 % improvement in overall symptoms in arms and neck.    Goal status: INITIAL    PLAN: PT FREQUENCY: 1-2x/week  PT DURATION: 8 weeks  PLANNED INTERVENTIONS: Therapeutic exercises, Therapeutic activity, Neuromuscular re-education, Patient/Family education, Self Care, Joint mobilization, Joint manipulation, Stair training, DME instructions, Aquatic Therapy, Dry Needling, Electrical stimulation, Cryotherapy, Moist heat, Taping, Ultrasound, Ionotophoresis 4mg /ml Dexamethasone , Manual therapy,  Vasopneumatic device, Traction, Spinal manipulation, Spinal mobilization,   PLAN FOR NEXT SESSION:    Terrilee Few, PT, DPT 3:16 PM  05/24/23

## 2023-05-25 ENCOUNTER — Encounter: Payer: Self-pay | Admitting: Obstetrics and Gynecology

## 2023-05-26 ENCOUNTER — Other Ambulatory Visit (HOSPITAL_COMMUNITY): Payer: Self-pay

## 2023-05-26 NOTE — Telephone Encounter (Signed)
 Pharmacy Patient Advocate Encounter  Received notification from [facility] that Prior Authorization for Contrave  8-90MG  er tablets has been APPROVED from 05/25/23 to 09/19/23. Ran test claim, Copay is $24.98. This test claim was processed through [facility]- copay amounts may vary at other pharmacies due to pharmacy/plan contracts, or as the patient moves through the different stages of their insurance plan.   PA #/Case ID/Reference #: [id number]  *78 tablets per 30 days is approved from 05/25/23-06/24/23. 120 tablets per 30 days is approved from 06/21/23 - 09/19/23.

## 2023-05-27 ENCOUNTER — Encounter: Admitting: Physical Therapy

## 2023-05-31 ENCOUNTER — Ambulatory Visit (INDEPENDENT_AMBULATORY_CARE_PROVIDER_SITE_OTHER): Admitting: Physical Therapy

## 2023-05-31 ENCOUNTER — Encounter: Payer: Self-pay | Admitting: Physical Therapy

## 2023-05-31 DIAGNOSIS — M5412 Radiculopathy, cervical region: Secondary | ICD-10-CM

## 2023-05-31 DIAGNOSIS — M542 Cervicalgia: Secondary | ICD-10-CM | POA: Diagnosis not present

## 2023-05-31 NOTE — Therapy (Addendum)
 " OUTPATIENT PHYSICAL THERAPY UPPER EXTREMITY TREATMENT   Patient Name: Monica Benjamin MRN: [id number] DOB:11/05/1980, 43 y.o., female Today's Date: 05/31/2023  END OF SESSION:  PT End of Session - 05/31/23 1215     Visit Number 5    Number of Visits 16    Date for PT Re-Evaluation 06/21/23    Authorization Type [facility]/save/[employer]    PT Start Time 1216    PT Stop Time 1300    PT Time Calculation (min) 44 min    Activity Tolerance Patient tolerated treatment well    Behavior During Therapy [facility] for tasks assessed/performed             Past Medical History:  Diagnosis Date   Allergy     Anxiety    Asthma    Back pain    Depression    GERD (gastroesophageal reflux disease)    Hyperlipidemia    Hypertension    IBS (irritable bowel syndrome)    Infertility, female 02/15/2017   IVF treatment   Lumbar herniated disc    L4-L5   Migraines    Other fatigue    Shortness of breath on exertion    Sleep apnea    Trigeminal neuralgia    Vitamin D  deficiency 01/26/2021   Past Surgical History:  Procedure Laterality Date   BREAST REDUCTION SURGERY Bilateral 12/31/2021   Procedure: BREAST REDUCTION WITH LIPOSUCTION;  Surgeon: Lowery Estefana RAMAN, DO;  Location: [facility];  Service: Plastics;  Laterality: Bilateral;   WISDOM TOOTH EXTRACTION     Patient Active Problem List   Diagnosis Date Noted   Fibromyalgia 05/20/2023   Vitamin B12 deficiency 01/30/2023   Hamartoma (HCC) 10/12/2022   Perimenopausal symptoms 10/12/2022   Vitamin D  deficiency 02/26/2021   NAFLD (nonalcoholic fatty liver disease) 97/98/7976   Mild episode of recurrent major depressive disorder (HCC) 05/10/2018   Hypertriglyceridemia 05/10/2018   Essential hypertension 03/16/2018   Infertility, female 02/15/2017   History of obstructive sleep apnea 05/25/2016   Chronic seasonal allergic rhinitis due to pollen 11/25/2015   GAD (generalized anxiety disorder) 11/25/2015   Gastroesophageal  reflux disease without esophagitis 11/25/2015   Irritable bowel syndrome with both constipation and diarrhea 11/25/2015   Moderate persistent asthma without complication 11/25/2015    PCP: Lavern Heck  REFERRING PROVIDER: Lavern Heck  REFERRING DIAG: Neck pain/ S/P breast reduction  THERAPY DIAG:  Cervicalgia  Radiculopathy, cervical region  Rationale for Evaluation and Treatment: Rehabilitation  ONSET DATE: 50mo-1 yr ago  SUBJECTIVE:  SUBJECTIVE STATEMENT:  Pt states improving pain in neck.   Eval: Pt states  Increased neck pain for about 6 mo-1 yr. Pain on  R side of neck , R UT. Both arms feel heavy, more on the L.   Neck pain feels constant. Hurts more in AM. Pt works at desk, tries to walk 3x/wk.  Sleeps at least 6 hrs/night,   Hand dominance: Right  PERTINENT HISTORY: s/p breast reduction, back pain,    PAIN:  Are you having pain? Yes: NPRS scale: 0- 2 /10 Pain location: R side of neck, into arms. Pain description: sore , heavy Aggravating factors: most movement Relieving factors: none stated    PRECAUTIONS: None  RED FLAGS: None   WEIGHT BEARING RESTRICTIONS: No   FALLS:  Has patient fallen in last 6 months? No   PLOF: Independent  PATIENT GOALS: Decreased pain in neck and arms.   NEXT MD VISIT:   OBJECTIVE:  Updated 5/5   DIAGNOSTIC FINDINGS:    PATIENT SURVEYS :    COGNITION: Overall cognitive status: Within functional limits for tasks assessed     SENSATION: WFL  POSTURE: tendency for fwd head, and posterior pelvic tilt in sitting.    UPPER EXTREMITY ROM:  Shoulders: WFL Neck: WFL  UPPER EXTREMITY MMT:  MMT Right eval Left eval R 05/31/23 L 05/31/23  Shoulder flexion 4 4 5 5   Shoulder extension      Shoulder abduction 4 4 5 5   Shoulder  adduction      Shoulder internal rotation      Shoulder external rotation      Middle trapezius      Lower trapezius      Elbow flexion      Elbow extension      Wrist flexion      Wrist extension      Wrist ulnar deviation      Wrist radial deviation      Wrist pronation      Wrist supination      Grip strength (lbs)      (Blank rows = not tested)  SHOULDER SPECIAL TESTS:   JOINT MOBILITY TESTING:  wfl   PALPATION: tenderness in R UT, R scalenes, into 1st rib,   LAD: relieving for shoulders.      TODAY'S TREATMENT:                                                                                                                                         DATE:   05/31/2023 Therapeutic Exercise: Aerobic: Supine:     flexion/cane for shoulders/lats x 15;  Seated:   Standing:  wall angels 3x 5;    Rows blue  x 20;  Shoulder ER RTB x 20;   Wall push ups 2 x 10; military press 4lb 2 x 5; Scap pull aparts Gtb x 15;  Stretches:  doorway stretch  20 sec x 3 ;   supine pec stretch 30 sec x 3 with nerve glides;  Neuromuscular Re-education: Manual Therapy: cervical distraction x 10; Cervical distraction ;  Therapeutic Activity: Self Care:   Therapeutic Exercise: Aerobic: Supine: Seated: cervical rom- L/R rotation x 10, flexion x 5, UT stretch 20 sec x 3 bil;  Standing: shoulder rolls x 10 review for HEP;    Stretches:  sub occipital stretch 10 sec x 3;  doorway stretch 20 sec x 3 ;  supine pec stretch 30 sec x 3 with nerve glides;  Neuromuscular Re-education: Manual Therapy: STM to bil cervical paraspinals, bil SO, SOR, R UT, R scalenes,   Cervical distraction ;  Therapeutic Activity: Self Care:    Eval: see below for HEP Manual: LAD for shoulders bil   PATIENT EDUCATION:  Education details: updated and reviewed HEP.  Person educated: Patient Education method: Explanation, Demonstration, Tactile cues, Verbal cues, and Handouts Education comprehension: verbalized  understanding, returned demonstration, verbal cues required, tactile cues required, and needs further education   HOME EXERCISE PROGRAM: Access Code: [email or web address] URL: [email or web address] Date: 05/03/2023 Prepared by: Tinnie Don  Exercises - Seated Cervical Sidebending Stretch  - 2 x daily - 3 reps - 15 hold - Seated Levator Scapulae Stretch  - 2 x daily - 3 reps - 20 hold - Seated Cervical Rotation AROM  - 2 x daily - 1 sets - 5 reps - Seated Shoulder Rolls  - 2 x daily - 1 sets - 5-10 reps  ASSESSMENT:  CLINICAL IMPRESSION: Pt progressing well, with decreasing pain and minimal tenderness with palpation today. Improved ROM with repeated motions for R rotation and extension today. Also has improved shoulder flexion ROM and decreased tightness/pulling in lats. Continues to be challenged with strengthening/endurance for muscles, but improving. Discussed benefits for continued strengthening.   Eval:  Patient presents with primary complaint of pain in neck and heaviness into arms. She has increased pain in R side of neck and musculature. She has decreased posture with fwd head. She has ongoing pain with lack of effective HEP. Pt with decreased ability for full functional activities, reaching, lifting, carrying, and IADLs. Pt to benefit from skilled PT to improve deficits and pain.    OBJECTIVE IMPAIRMENTS: decreased activity tolerance, decreased mobility, decreased ROM, decreased strength, increased muscle spasms, impaired flexibility, improper body mechanics, and pain.   ACTIVITY LIMITATIONS: carrying, lifting, dressing, reach over head, hygiene/grooming, and locomotion level  PARTICIPATION LIMITATIONS: meal prep, cleaning, laundry, driving, shopping, community activity, occupation, and yard work  PERSONAL FACTORS: Past/current experiences and Time since onset of injury/illness/exacerbation are also affecting patient's functional outcome.   REHAB POTENTIAL:  Good  CLINICAL DECISION MAKING: Stable/uncomplicated  EVALUATION COMPLEXITY: Low  GOALS: Goals reviewed with patient? Yes   SHORT TERM GOALS: Target date: 05/10/2023  Pt to be intendment with initial HEP  Goal status: INITIAL   LONG TERM GOALS: Target date: 06/21/2023   Pt to be independent with final HEP  Goal status: INITIAL  2.  Pt to demo improved cervical ROM to be [facility] and pain free, to improve ability for ADLs.   Goal status: INITIAL  3.  Pt to demo improved shoulder and postural strength to be at least 4+/5, to improve stability and pain.  Goal status: INITIAL  4.  Pt to report decreased pain 0-2/10 during work day.   Goal status: INITIAL  5. Pt to report at least 75 % improvement in overall symptoms in arms and neck.  Goal status: INITIAL    PLAN: PT FREQUENCY: 1-2x/week  PT DURATION: 8 weeks  PLANNED INTERVENTIONS: Therapeutic exercises, Therapeutic activity, Neuromuscular re-education, Patient/Family education, Self Care, Joint mobilization, Joint manipulation, Stair training, DME instructions, Aquatic Therapy, Dry Needling, Electrical stimulation, Cryotherapy, Moist heat, Taping, Ultrasound, Ionotophoresis 4mg /ml Dexamethasone , Manual therapy,  Vasopneumatic device, Traction, Spinal manipulation, Spinal mobilization,   PLAN FOR NEXT SESSION:    Tinnie Don, PT, DPT 12:16 PM  05/31/23    PHYSICAL THERAPY DISCHARGE SUMMARY  Visits from Start of Care: 5   Plan: Patient agrees to discharge.  Patient goals were partially  met. Patient is being discharged due to meeting the stated rehab goals.      Tinnie Don, PT, DPT 11:31 AM  02/22/24       "

## 2023-06-01 ENCOUNTER — Ambulatory Visit: Payer: 59 | Admitting: Primary Care

## 2023-06-01 ENCOUNTER — Encounter: Payer: Self-pay | Admitting: Primary Care

## 2023-06-03 ENCOUNTER — Encounter: Admitting: Physical Therapy

## 2023-06-03 DIAGNOSIS — F321 Major depressive disorder, single episode, moderate: Secondary | ICD-10-CM | POA: Diagnosis not present

## 2023-06-07 ENCOUNTER — Encounter: Admitting: Physical Therapy

## 2023-06-14 ENCOUNTER — Encounter: Admitting: Physical Therapy

## 2023-06-23 ENCOUNTER — Other Ambulatory Visit (HOSPITAL_COMMUNITY): Payer: Self-pay

## 2023-06-25 ENCOUNTER — Ambulatory Visit: Admitting: Plastic Surgery

## 2023-07-01 DIAGNOSIS — F321 Major depressive disorder, single episode, moderate: Secondary | ICD-10-CM | POA: Diagnosis not present

## 2023-07-16 ENCOUNTER — Other Ambulatory Visit: Payer: Self-pay

## 2023-08-05 DIAGNOSIS — F321 Major depressive disorder, single episode, moderate: Secondary | ICD-10-CM | POA: Diagnosis not present

## 2023-08-09 ENCOUNTER — Ambulatory Visit: Admitting: Family Medicine

## 2023-09-09 DIAGNOSIS — F321 Major depressive disorder, single episode, moderate: Secondary | ICD-10-CM | POA: Diagnosis not present

## 2023-09-13 ENCOUNTER — Encounter: Admitting: Rheumatology

## 2023-09-16 DIAGNOSIS — F321 Major depressive disorder, single episode, moderate: Secondary | ICD-10-CM | POA: Diagnosis not present

## 2023-09-23 DIAGNOSIS — F321 Major depressive disorder, single episode, moderate: Secondary | ICD-10-CM | POA: Diagnosis not present

## 2023-09-28 ENCOUNTER — Other Ambulatory Visit (HOSPITAL_COMMUNITY): Payer: Self-pay

## 2023-10-05 DIAGNOSIS — F321 Major depressive disorder, single episode, moderate: Secondary | ICD-10-CM | POA: Diagnosis not present

## 2023-10-14 ENCOUNTER — Ambulatory Visit: Admitting: Rheumatology

## 2023-10-18 ENCOUNTER — Other Ambulatory Visit (HOSPITAL_COMMUNITY): Payer: Self-pay

## 2023-10-19 ENCOUNTER — Other Ambulatory Visit (HOSPITAL_COMMUNITY): Payer: Self-pay

## 2023-10-19 ENCOUNTER — Telehealth: Payer: Self-pay

## 2023-10-19 NOTE — Telephone Encounter (Signed)
 Pharmacy Patient Advocate Encounter   Received notification from Onbase that prior authorization for Contrave  8-90MG  er tablets is required/requested.   Insurance verification completed.   The patient is insured through [facility] .   Per test claim: PA required; PA started via CoverMyMeds. KEY [id number] . Please see clinical question(s) below that I am not finding the answer to in their chart and advise.  (Renewal Requests) Has the patient achieved or maintained at least a 5 percent weight loss of baseline body weight after 3 months of treatment at the maintenance dose (two 8/90mg  tablets twice daily)?

## 2023-10-21 ENCOUNTER — Other Ambulatory Visit (HOSPITAL_COMMUNITY): Payer: Self-pay

## 2023-10-22 ENCOUNTER — Other Ambulatory Visit (HOSPITAL_COMMUNITY): Payer: Self-pay

## 2023-11-01 ENCOUNTER — Other Ambulatory Visit: Payer: Self-pay | Admitting: Family Medicine

## 2023-11-01 ENCOUNTER — Telehealth (HOSPITAL_COMMUNITY): Payer: Self-pay

## 2023-11-01 ENCOUNTER — Other Ambulatory Visit: Payer: Self-pay

## 2023-11-01 ENCOUNTER — Ambulatory Visit: Admitting: Family Medicine

## 2023-11-01 ENCOUNTER — Other Ambulatory Visit (HOSPITAL_COMMUNITY): Payer: Self-pay

## 2023-11-01 MED ORDER — NALTREXONE-BUPROPION HCL ER 8-90 MG PO TB12
ORAL_TABLET | ORAL | 5 refills | Status: DC
  Filled 2023-11-01: qty 180, 55d supply, fill #0
  Filled 2023-11-01: qty 120, 30d supply, fill #0

## 2023-11-01 NOTE — Telephone Encounter (Signed)
 Copied from CRM [id number]. Topic: Clinical - Medication Refill >> Nov 01, 2023  7:53 AM Mesmerise C wrote: Medication: Naltrexone -buPROPion  HCl ER 8-90 MG TB12  Has the patient contacted their pharmacy? No (Agent: If no, request that the patient contact the pharmacy for the refill. If patient does not wish to contact the pharmacy document the reason why and proceed with request.) (Agent: If yes, when and what did the pharmacy advise?) Stated won't be filled until she has a follow up she has one scheduled for tomorrow her was cancelled today due to provider  This is the patient's preferred pharmacy:   [facility] [address], [address] [place] [address] Phone: [phone] Fax: [phone]  Is this the correct pharmacy for this prescription? Yes If no, delete pharmacy and type the correct one.   Has the prescription been filled recently? No  Is the patient out of the medication? Yes  Has the patient been seen for an appointment in the last year OR does the patient have an upcoming appointment? Yes  Can we respond through MyChart? Yes  Agent: Please be advised that Rx refills may take up to 3 business days. We ask that you follow-up with your pharmacy.

## 2023-11-02 ENCOUNTER — Ambulatory Visit (INDEPENDENT_AMBULATORY_CARE_PROVIDER_SITE_OTHER): Admitting: Family Medicine

## 2023-11-02 ENCOUNTER — Encounter: Payer: Self-pay | Admitting: Family Medicine

## 2023-11-02 ENCOUNTER — Other Ambulatory Visit (HOSPITAL_COMMUNITY): Payer: Self-pay

## 2023-11-02 VITALS — BP 164/104 | HR 73 | Temp 98.0°F | Ht 64.0 in | Wt 227.8 lb

## 2023-11-02 DIAGNOSIS — N951 Menopausal and female climacteric states: Secondary | ICD-10-CM | POA: Diagnosis not present

## 2023-11-02 DIAGNOSIS — I10 Essential (primary) hypertension: Secondary | ICD-10-CM | POA: Diagnosis not present

## 2023-11-02 DIAGNOSIS — F331 Major depressive disorder, recurrent, moderate: Secondary | ICD-10-CM | POA: Diagnosis not present

## 2023-11-02 DIAGNOSIS — M797 Fibromyalgia: Secondary | ICD-10-CM

## 2023-11-02 DIAGNOSIS — Z23 Encounter for immunization: Secondary | ICD-10-CM

## 2023-11-02 DIAGNOSIS — F411 Generalized anxiety disorder: Secondary | ICD-10-CM

## 2023-11-02 MED ORDER — BUPROPION HCL ER (XL) 150 MG PO TB24: 150.0000 mg | ORAL_TABLET | Freq: Every day | ORAL | 3 refills | Status: DC

## 2023-11-02 MED ORDER — AMLODIPINE BESYLATE 5 MG PO TABS: 5.0000 mg | ORAL_TABLET | Freq: Every day | ORAL | 3 refills | Status: DC

## 2023-11-02 MED ORDER — LOSARTAN POTASSIUM-HCTZ 100-25 MG PO TABS
1.0000 | ORAL_TABLET | Freq: Every day | ORAL | 3 refills | Status: AC
  Filled 2023-11-02 – 2023-12-15 (×2): qty 90, 90d supply, fill #0

## 2023-11-02 MED ORDER — BUPROPION HCL ER (XL) 150 MG PO TB24
150.0000 mg | ORAL_TABLET | Freq: Every day | ORAL | 3 refills | Status: AC
  Filled 2023-11-02: qty 90, 90d supply, fill #0

## 2023-11-02 MED ORDER — LOSARTAN POTASSIUM-HCTZ 100-25 MG PO TABS: 1.0000 | ORAL_TABLET | Freq: Every day | ORAL | 3 refills | Status: DC

## 2023-11-02 NOTE — Progress Notes (Signed)
 Subjective  CC:  Chief Complaint  Patient presents with   Weight Loss    Pt thoughts on the medication is one sided bc she feels that she has not lost weight bc she has not been able to move up in dosage bc of side effects     HPI: Monica Benjamin is a 43 y.o. female who presents to the office today to address the problems listed above in the chief complaint. Discussed the use of AI scribe software for clinical note transcription with the patient, who gave verbal consent to proceed.  History of Present Illness Monica Benjamin is a 43 year old female with hypertension and fibromyalgia who presents with medication side effects and weight management concerns.  Obesity: Has been on  Contrave  for about 6 months, however has not lost any weight.  Actually weight has increased. - Benefits include some decreased aching, better sleep, decreased appetite, lower caloric intake - However she has had significant side effects from naltrexone , including stomach cramping, alternating upset stomach and constipation, headaches, sweating, and irregular menstrual cycles - Menstrual cycles previously regular every 30 days, now irregular with a current gap of 45 days since last period - She has been eating well and exercising daily.  Chronic depression and anxiety: Maintained on Trintellix  and Wellbutrin  - Wellbutrin  has improved mood and reduced panic symptoms, definitely feels the benefits of the addition of Wellbutrin .  Mood is elevated.  Less panic.  Rarely needing Klonopin . - Clonazepam  use decreased to three times since starting Wellbutrin  - Less frequent panic episodes and overall mood improvement  Fibromyalgia. - Daily back pain persists without improvement despite current medication regimen - Sleep quality has improved, unclear if related to naltrexone   Hypertension - History of hypertension, currently managed with losartan  HCTZ 50/12.5.  Compliant with medication. - Blood pressure  often elevated during visits - No use of decongestants such as Sudafed - Uses Zyrtec and Atrovent  nasal spray  Weight management and metabolic concerns - Difficulty achieving significant weight loss despite decreased appetite and not overeating - Attributes weight management challenges to metabolic changes associated with perimenopause - Remains physically active, regularly walking her dog and staying mobile   Assessment  1. Essential hypertension   2. Severe obesity (BMI 35.0-39.9) with comorbidity (HCC)   3. Fibromyalgia   4. Perimenopausal symptoms   5. Major depressive disorder, recurrent episode, moderate (HCC)   6. GAD (generalized anxiety disorder)   7. Need for pneumococcal 20-valent conjugate vaccination      Plan  Assessment and Plan Assessment & Plan Morbid obesity due to excess calories Experiencing challenges with weight management. Current treatment with naltrexone  is ineffective, with significant side effects including stomach cramping, constipation, headaches, and sweating. Unable to afford GLP-1 medications due to lack of insurance coverage. Compounded medications suggested as a potential alternative, which may include B12 and glycine to aid in weight loss and metabolic regulation. - Discontinue naltrexone  - Discuss compounded GLP-1 medications as an option for weight management.  Decreasing weight would help many of her medical problems including hypertension, fibromyalgia pain, osteoarthritis, brain fog from perimenopause. - Encourage clean eating and monitoring of menstrual cycles  Essential hypertension Blood pressure remains elevated, potentially exacerbated by current medications. Currently on losartan  and reports no use of medications like Sudafed that could increase blood pressure. - Increase dose of Hyzaar  to 100/25 daily.  Depression and anxiety Reports improvement in mood and reduction in panic episodes since starting Wellbutrin . Currently on a dose of  150 mg once daily, which is effectively managing symptoms. - Continue Wellbutrin  150 mg once daily.  Improved  Perimenopausal symptoms Reports irregular menstrual cycles, which may be related to perimenopausal changes. Experiencing symptoms consistent with perimenopause, including irregular periods and metabolic changes. - Monitor menstrual cycles - Discuss potential hormone replacement therapy in the future  Health maintenance: Eligible for Prevnar, Tdap and flu shot.  She will get flu shot at work.  Prevnar today and will give Tdap at her physical in 3 months.   Follow up: 3 months for complete physical Orders Placed This Encounter  Procedures   Pneumococcal conjugate vaccine 20-valent (Prevnar 20)   Meds ordered this encounter  Medications   DISCONTD: amLODipine (NORVASC) 5 MG tablet    Sig: Take 1 tablet (5 mg total) by mouth daily.    Dispense:  90 tablet    Refill:  3   DISCONTD: buPROPion  (WELLBUTRIN  XL) 150 MG 24 hr tablet    Sig: Take 1 tablet (150 mg total) by mouth daily.    Dispense:  90 tablet    Refill:  3   DISCONTD: losartan -hydrochlorothiazide  (HYZAAR ) 100-25 MG tablet    Sig: Take 1 tablet by mouth daily.    Dispense:  90 tablet    Refill:  3   losartan -hydrochlorothiazide  (HYZAAR ) 100-25 MG tablet    Sig: Take 1 tablet by mouth daily.    Dispense:  90 tablet    Refill:  3   buPROPion  (WELLBUTRIN  XL) 150 MG 24 hr tablet    Sig: Take 1 tablet (150 mg total) by mouth daily.    Dispense:  90 tablet    Refill:  3     I reviewed the patients updated PMH, FH, and SocHx.  Patient Active Problem List   Diagnosis Date Noted   Severe obesity (BMI 35.0-39.9) with comorbidity (HCC) 11/02/2023    Priority: High   Fibromyalgia 05/20/2023    Priority: High   Mild episode of recurrent major depressive disorder 05/10/2018    Priority: High   Essential hypertension 03/16/2018    Priority: High   History of obstructive sleep apnea 05/25/2016    Priority: High   GAD  (generalized anxiety disorder) 11/25/2015    Priority: High   Moderate persistent asthma without complication 11/25/2015    Priority: High   Hamartoma (HCC) 10/12/2022    Priority: Medium    NAFLD (nonalcoholic fatty liver disease) 97/98/7976    Priority: Medium    Hypertriglyceridemia 05/10/2018    Priority: Medium    Chronic seasonal allergic rhinitis due to pollen 11/25/2015    Priority: Medium    Gastroesophageal reflux disease without esophagitis 11/25/2015    Priority: Medium    Irritable bowel syndrome with both constipation and diarrhea 11/25/2015    Priority: Medium    Vitamin B12 deficiency 01/30/2023    Priority: Low   Perimenopausal symptoms 10/12/2022    Priority: Low   Vitamin D  deficiency 02/26/2021    Priority: Low   Infertility, female 02/15/2017    Priority: Low   Major depressive disorder, recurrent episode, moderate (HCC) 11/02/2023   Current Meds  Medication Sig   [DISCONTINUED] amLODipine (NORVASC) 5 MG tablet Take 1 tablet (5 mg total) by mouth daily.   [DISCONTINUED] buPROPion  (WELLBUTRIN  XL) 150 MG 24 hr tablet Take 1 tablet (150 mg total) by mouth daily.   [DISCONTINUED] losartan -hydrochlorothiazide  (HYZAAR ) 100-25 MG tablet Take 1 tablet by mouth daily.   Allergies: Patient is allergic to ace inhibitors, nsaids, and  prednisone . Family History: Patient family history includes Alcoholism in her father and another family member; Anxiety disorder in her mother and another family member; COPD in her paternal grandmother; Dementia in her maternal grandmother; Diabetes in her maternal grandfather; Hyperlipidemia in her father; Hypertension in her father; Migraines in her mother and sister; Osteoporosis in her mother. Social History:  Patient  reports that she has never smoked. She has never been exposed to tobacco smoke. She has never used smokeless tobacco. She reports current alcohol use. She reports that she does not use drugs.  Review of  Systems: Constitutional: Negative for fever malaise or anorexia Cardiovascular: negative for chest pain Respiratory: negative for SOB or persistent cough Gastrointestinal: negative for abdominal pain  Objective  Vitals: BP (!) 164/104   Pulse 73   Temp 98 F (36.7 C)   Ht 5' 4 (1.626 m)   Wt 227 lb 12.8 oz (103.3 kg)   SpO2 97%   BMI 39.10 kg/m  General: no acute distress , A&Ox3 Psych: Happy affect today.  Seems brighter.  Good insight.  Normal speech  Wt Readings from Last 3 Encounters:  11/02/23 227 lb 12.8 oz (103.3 kg)  05/20/23 221 lb 12.8 oz (100.6 kg)  05/19/23 224 lb (101.6 kg)    Commons side effects, risks, benefits, and alternatives for medications and treatment plan prescribed today were discussed, and the patient expressed understanding of the given instructions. Patient is instructed to call or message via MyChart if he/she has any questions or concerns regarding our treatment plan. No barriers to understanding were identified. We discussed Red Flag symptoms and signs in detail. Patient expressed understanding regarding what to do in case of urgent or emergency type symptoms.  Medication list was reconciled, printed and provided to the patient in AVS. Patient instructions and summary information was reviewed with the patient as documented in the AVS. This note was prepared with assistance of Dragon voice recognition software. Occasional wrong-word or sound-a-like substitutions may have occurred due to the inherent limitations of voice recognition software

## 2023-11-02 NOTE — Patient Instructions (Addendum)
 Please return in 3 months for your annual complete physical; please come fasting.  For follow up on chronic medical conditions   If you have any questions or concerns, please don't hesitate to send me a message via MyChart or call the office at [phone]. Thank you for visiting with us  today! It's our pleasure caring for you.    VISIT SUMMARY: During your visit, we discussed your medication side effects, weight management concerns, hypertension, mood and anxiety symptoms, chronic pain, and perimenopausal symptoms. We made adjustments to your medications and discussed potential alternatives to help manage your conditions more effectively.  YOUR PLAN: MEDICATION SIDE EFFECTS: You have been experiencing significant side effects from naltrexone , including stomach cramping, constipation, headaches, sweating, and irregular menstrual cycles. -Discontinue naltrexone . -Discuss compounded GLP-1 medications as an option for weight management. -Encourage clean eating and monitoring of menstrual cycles.  HYPERTENSION: Your blood pressure remains elevated despite current medication. -Add amlodipine to your current regimen. -Continue taking losartan .  DEPRESSION: Your mood and panic symptoms have improved with Wellbutrin . -Continue Wellbutrin  150 mg once daily.  PERIMENOPAUSAL SYMPTOMS: You are experiencing irregular menstrual cycles and other symptoms consistent with perimenopause. -Monitor your menstrual cycles. -Discuss potential hormone replacement therapy in the future.                      Contains text generated by [employer].                                 Contains text generated by [employer].

## 2023-11-04 DIAGNOSIS — F321 Major depressive disorder, single episode, moderate: Secondary | ICD-10-CM | POA: Diagnosis not present

## 2023-11-12 ENCOUNTER — Telehealth: Admitting: Physician Assistant

## 2023-11-12 DIAGNOSIS — J029 Acute pharyngitis, unspecified: Secondary | ICD-10-CM

## 2023-11-12 MED ORDER — AMOXICILLIN 500 MG PO CAPS: 500.0000 mg | ORAL_CAPSULE | Freq: Two times a day (BID) | ORAL | 0 refills | Status: AC

## 2023-11-12 NOTE — Progress Notes (Signed)

## 2023-11-18 DIAGNOSIS — F321 Major depressive disorder, single episode, moderate: Secondary | ICD-10-CM | POA: Diagnosis not present

## 2023-11-26 ENCOUNTER — Other Ambulatory Visit: Payer: Self-pay | Admitting: Medical Genetics

## 2023-11-26 DIAGNOSIS — Z006 Encounter for examination for normal comparison and control in clinical research program: Secondary | ICD-10-CM

## 2023-12-02 DIAGNOSIS — F321 Major depressive disorder, single episode, moderate: Secondary | ICD-10-CM | POA: Diagnosis not present

## 2023-12-13 ENCOUNTER — Other Ambulatory Visit: Payer: Self-pay | Admitting: Family Medicine

## 2023-12-14 MED ORDER — CLONAZEPAM 0.5 MG PO TABS
0.2500 mg | ORAL_TABLET | Freq: Every day | ORAL | 1 refills | Status: AC
  Filled 2023-12-14: qty 20, 20d supply, fill #0

## 2023-12-15 ENCOUNTER — Other Ambulatory Visit (HOSPITAL_COMMUNITY): Payer: Self-pay

## 2023-12-16 ENCOUNTER — Other Ambulatory Visit (HOSPITAL_COMMUNITY): Payer: Self-pay

## 2023-12-16 DIAGNOSIS — F321 Major depressive disorder, single episode, moderate: Secondary | ICD-10-CM | POA: Diagnosis not present

## 2023-12-28 ENCOUNTER — Other Ambulatory Visit: Payer: Self-pay | Admitting: Family Medicine

## 2023-12-29 MED ORDER — PANTOPRAZOLE SODIUM 20 MG PO TBEC
20.0000 mg | DELAYED_RELEASE_TABLET | Freq: Every day | ORAL | 3 refills | Status: AC
  Filled 2023-12-29: qty 90, 90d supply, fill #0

## 2023-12-30 ENCOUNTER — Other Ambulatory Visit: Payer: Self-pay

## 2023-12-30 ENCOUNTER — Other Ambulatory Visit (HOSPITAL_COMMUNITY): Payer: Self-pay

## 2024-01-04 DIAGNOSIS — F321 Major depressive disorder, single episode, moderate: Secondary | ICD-10-CM | POA: Diagnosis not present

## 2024-01-11 ENCOUNTER — Other Ambulatory Visit: Payer: Self-pay | Admitting: Family Medicine

## 2024-01-12 ENCOUNTER — Other Ambulatory Visit: Payer: Self-pay

## 2024-01-12 ENCOUNTER — Other Ambulatory Visit (HOSPITAL_COMMUNITY): Payer: Self-pay

## 2024-01-12 MED ORDER — VORTIOXETINE HBR 10 MG PO TABS
10.0000 mg | ORAL_TABLET | Freq: Every day | ORAL | 3 refills | Status: AC
  Filled 2024-01-12: qty 90, 90d supply, fill #0

## 2024-02-14 ENCOUNTER — Encounter: Admitting: Family Medicine

## 2024-03-02 ENCOUNTER — Encounter: Admitting: Family Medicine

## 2024-03-07 ENCOUNTER — Ambulatory Visit: Admitting: Obstetrics and Gynecology

## 2024-05-04 ENCOUNTER — Encounter: Admitting: Family Medicine
